# Patient Record
Sex: Male | Born: 1950
Health system: Southern US, Community
[De-identification: ages and names within clinical notes are randomized; demographics above are authoritative.]

## PROBLEM LIST (undated history)

## (undated) DIAGNOSIS — K219 Gastro-esophageal reflux disease without esophagitis: Secondary | ICD-10-CM

## (undated) DIAGNOSIS — M4800 Spinal stenosis, site unspecified: Secondary | ICD-10-CM

## (undated) DIAGNOSIS — N400 Enlarged prostate without lower urinary tract symptoms: Secondary | ICD-10-CM

## (undated) DIAGNOSIS — L719 Rosacea, unspecified: Secondary | ICD-10-CM

## (undated) DIAGNOSIS — K5790 Diverticulosis of intestine, part unspecified, without perforation or abscess without bleeding: Secondary | ICD-10-CM

## (undated) DIAGNOSIS — M1990 Unspecified osteoarthritis, unspecified site: Secondary | ICD-10-CM

## (undated) DIAGNOSIS — F419 Anxiety disorder, unspecified: Secondary | ICD-10-CM

## (undated) DIAGNOSIS — D369 Benign neoplasm, unspecified site: Secondary | ICD-10-CM

## (undated) DIAGNOSIS — Z87442 Personal history of urinary calculi: Secondary | ICD-10-CM

## (undated) DIAGNOSIS — T7840XA Allergy, unspecified, initial encounter: Secondary | ICD-10-CM

## (undated) DIAGNOSIS — E785 Hyperlipidemia, unspecified: Secondary | ICD-10-CM

## (undated) DIAGNOSIS — I10 Essential (primary) hypertension: Secondary | ICD-10-CM

## (undated) HISTORY — DX: Benign neoplasm, unspecified site: D36.9

## (undated) HISTORY — PX: LUMBAR FUSION: SHX111

## (undated) HISTORY — DX: Diverticulosis of intestine, part unspecified, without perforation or abscess without bleeding: K57.90

## (undated) HISTORY — DX: Essential (primary) hypertension: I10

## (undated) HISTORY — PX: BACK SURGERY: SHX140

## (undated) HISTORY — PX: SPINAL CORD DECOMPRESSION: SHX97

## (undated) HISTORY — DX: Rosacea, unspecified: L71.9

## (undated) HISTORY — DX: Spinal stenosis, site unspecified: M48.00

## (undated) HISTORY — DX: Allergy, unspecified, initial encounter: T78.40XA

## (undated) HISTORY — DX: Hyperlipidemia, unspecified: E78.5

## (undated) HISTORY — DX: Benign prostatic hyperplasia without lower urinary tract symptoms: N40.0

## (undated) HISTORY — PX: ASD REPAIR: SHX258

## (undated) HISTORY — DX: Anxiety disorder, unspecified: F41.9

---

## 1991-03-29 DIAGNOSIS — E1129 Type 2 diabetes mellitus with other diabetic kidney complication: Secondary | ICD-10-CM | POA: Insufficient documentation

## 2002-03-14 ENCOUNTER — Encounter: Payer: Self-pay | Admitting: Internal Medicine

## 2004-03-01 ENCOUNTER — Ambulatory Visit: Payer: Self-pay | Admitting: Internal Medicine

## 2004-03-31 ENCOUNTER — Ambulatory Visit: Payer: Self-pay | Admitting: Internal Medicine

## 2004-04-09 ENCOUNTER — Ambulatory Visit: Payer: Self-pay | Admitting: Internal Medicine

## 2004-08-20 ENCOUNTER — Ambulatory Visit: Payer: Self-pay | Admitting: Internal Medicine

## 2004-08-30 ENCOUNTER — Ambulatory Visit: Payer: Self-pay | Admitting: Internal Medicine

## 2004-09-17 ENCOUNTER — Ambulatory Visit: Payer: Self-pay | Admitting: Specialist

## 2004-11-03 ENCOUNTER — Ambulatory Visit: Payer: Self-pay | Admitting: Internal Medicine

## 2004-11-22 ENCOUNTER — Ambulatory Visit: Payer: Self-pay | Admitting: Pain Medicine

## 2004-12-06 ENCOUNTER — Ambulatory Visit: Payer: Self-pay | Admitting: Pain Medicine

## 2004-12-07 ENCOUNTER — Ambulatory Visit: Payer: Self-pay | Admitting: Pain Medicine

## 2005-03-02 ENCOUNTER — Ambulatory Visit: Payer: Self-pay | Admitting: Internal Medicine

## 2005-05-18 ENCOUNTER — Encounter: Payer: Self-pay | Admitting: Internal Medicine

## 2005-09-01 ENCOUNTER — Ambulatory Visit: Payer: Self-pay | Admitting: Internal Medicine

## 2005-11-17 ENCOUNTER — Ambulatory Visit: Payer: Self-pay | Admitting: Internal Medicine

## 2006-03-06 ENCOUNTER — Ambulatory Visit: Payer: Self-pay | Admitting: Internal Medicine

## 2006-04-28 ENCOUNTER — Encounter: Payer: Self-pay | Admitting: Internal Medicine

## 2006-09-01 ENCOUNTER — Encounter: Payer: Self-pay | Admitting: Internal Medicine

## 2006-09-01 DIAGNOSIS — I10 Essential (primary) hypertension: Secondary | ICD-10-CM | POA: Insufficient documentation

## 2006-09-01 DIAGNOSIS — E785 Hyperlipidemia, unspecified: Secondary | ICD-10-CM | POA: Insufficient documentation

## 2006-09-01 DIAGNOSIS — F411 Generalized anxiety disorder: Secondary | ICD-10-CM | POA: Insufficient documentation

## 2006-09-01 DIAGNOSIS — L719 Rosacea, unspecified: Secondary | ICD-10-CM | POA: Insufficient documentation

## 2006-09-01 DIAGNOSIS — K219 Gastro-esophageal reflux disease without esophagitis: Secondary | ICD-10-CM | POA: Insufficient documentation

## 2006-09-01 DIAGNOSIS — T7840XA Allergy, unspecified, initial encounter: Secondary | ICD-10-CM | POA: Insufficient documentation

## 2006-09-05 ENCOUNTER — Ambulatory Visit: Payer: Self-pay | Admitting: Internal Medicine

## 2006-09-06 LAB — CONVERTED CEMR LAB
ALT: 29 units/L (ref 0–40)
Albumin: 4.1 g/dL (ref 3.5–5.2)
BUN: 12 mg/dL (ref 6–23)
CO2: 30 meq/L (ref 19–32)
Calcium: 9.5 mg/dL (ref 8.4–10.5)
Chloride: 101 meq/L (ref 96–112)
Cholesterol: 167 mg/dL (ref 0–200)
Creatinine, Ser: 0.6 mg/dL (ref 0.4–1.5)
Direct LDL: 94.1 mg/dL
GFR calc Af Amer: 180 mL/min
GFR calc non Af Amer: 149 mL/min
Glucose, Bld: 90 mg/dL (ref 70–99)
HDL: 38.8 mg/dL — ABNORMAL LOW (ref >39.0)
Hgb A1c MFr Bld: 6.9 % — ABNORMAL HIGH (ref 4.6–6.0)
Phosphorus: 3.7 mg/dL (ref 2.3–4.6)
Potassium: 4.6 meq/L (ref 3.5–5.1)
Sodium: 139 meq/L (ref 135–145)
Total CHOL/HDL Ratio: 4.3
Triglycerides: 262 mg/dL (ref 0–149)
VLDL: 52 mg/dL — ABNORMAL HIGH (ref 0–40)

## 2006-10-24 ENCOUNTER — Encounter: Payer: Self-pay | Admitting: Internal Medicine

## 2007-01-29 ENCOUNTER — Ambulatory Visit: Payer: Self-pay | Admitting: Neurosurgery

## 2007-02-14 ENCOUNTER — Encounter: Payer: Self-pay | Admitting: Internal Medicine

## 2007-03-08 ENCOUNTER — Ambulatory Visit: Payer: Self-pay | Admitting: Internal Medicine

## 2007-03-08 DIAGNOSIS — N400 Enlarged prostate without lower urinary tract symptoms: Secondary | ICD-10-CM | POA: Insufficient documentation

## 2007-03-09 LAB — CONVERTED CEMR LAB
ALT: 33 units/L (ref 0–53)
Albumin: 4.7 g/dL (ref 3.5–5.2)
BUN: 13 mg/dL (ref 6–23)
CO2: 27 meq/L (ref 19–32)
Calcium: 9.7 mg/dL (ref 8.4–10.5)
Chloride: 104 meq/L (ref 96–112)
Cholesterol: 166 mg/dL (ref 0–200)
Creatinine, Ser: 0.69 mg/dL (ref 0.40–1.50)
Creatinine, Urine: 128.3 mg/dL
Glucose, Bld: 80 mg/dL (ref 70–99)
HDL: 46 mg/dL (ref >39)
Hgb A1c MFr Bld: 6.9 % — ABNORMAL HIGH (ref 4.6–6.1)
LDL Cholesterol: 77 mg/dL (ref 0–99)
Microalb Creat Ratio: 25.2 mg/g (ref 0.0–30.0)
Microalb, Ur: 3.23 mg/dL — ABNORMAL HIGH (ref 0.00–1.89)
PSA: 0.81 ng/mL (ref 0.10–4.00)
Phosphorus: 4.3 mg/dL (ref 2.3–4.6)
Potassium: 5.1 meq/L (ref 3.5–5.3)
Sodium: 141 meq/L (ref 135–145)
Total CHOL/HDL Ratio: 3.6
Triglycerides: 215 mg/dL — ABNORMAL HIGH (ref <150)
VLDL: 43 mg/dL — ABNORMAL HIGH (ref 0–40)

## 2007-03-12 ENCOUNTER — Telehealth (INDEPENDENT_AMBULATORY_CARE_PROVIDER_SITE_OTHER): Payer: Self-pay | Admitting: *Deleted

## 2007-04-09 ENCOUNTER — Ambulatory Visit: Payer: Self-pay | Admitting: Internal Medicine

## 2007-04-10 LAB — CONVERTED CEMR LAB
ALT: 27 units/L (ref 0–53)
AST: 17 units/L (ref 0–37)
Albumin: 3.9 g/dL (ref 3.5–5.2)
Alkaline Phosphatase: 27 units/L — ABNORMAL LOW (ref 39–117)
Bilirubin, Direct: 0.1 mg/dL (ref 0.0–0.3)
Total Bilirubin: 1.2 mg/dL (ref 0.3–1.2)
Total Protein: 6.1 g/dL (ref 6.0–8.3)

## 2007-04-30 ENCOUNTER — Encounter: Payer: Self-pay | Admitting: Internal Medicine

## 2007-05-01 ENCOUNTER — Telehealth (INDEPENDENT_AMBULATORY_CARE_PROVIDER_SITE_OTHER): Payer: Self-pay | Admitting: *Deleted

## 2007-05-10 ENCOUNTER — Inpatient Hospital Stay (HOSPITAL_COMMUNITY): Admission: RE | Admit: 2007-05-10 | Discharge: 2007-05-13 | Payer: Self-pay | Admitting: Neurosurgery

## 2007-06-08 ENCOUNTER — Encounter: Payer: Self-pay | Admitting: Internal Medicine

## 2007-07-09 ENCOUNTER — Encounter: Payer: Self-pay | Admitting: Internal Medicine

## 2007-07-11 ENCOUNTER — Encounter: Payer: Self-pay | Admitting: Internal Medicine

## 2007-09-10 ENCOUNTER — Encounter: Payer: Self-pay | Admitting: Internal Medicine

## 2007-09-11 ENCOUNTER — Telehealth (INDEPENDENT_AMBULATORY_CARE_PROVIDER_SITE_OTHER): Payer: Self-pay | Admitting: *Deleted

## 2007-09-11 ENCOUNTER — Ambulatory Visit: Payer: Self-pay | Admitting: Internal Medicine

## 2007-09-12 LAB — CONVERTED CEMR LAB
ALT: 33 units/L (ref 0–53)
AST: 25 units/L (ref 0–37)
Albumin: 4.5 g/dL (ref 3.5–5.2)
Alkaline Phosphatase: 36 units/L — ABNORMAL LOW (ref 39–117)
BUN: 11 mg/dL (ref 6–23)
Basophils Absolute: 0 10*3/uL (ref 0.0–0.1)
Basophils Relative: 0.1 % (ref 0.0–1.0)
Bilirubin, Direct: 0.1 mg/dL (ref 0.0–0.3)
CO2: 34 meq/L — ABNORMAL HIGH (ref 19–32)
Calcium: 9.4 mg/dL (ref 8.4–10.5)
Chloride: 102 meq/L (ref 96–112)
Cholesterol: 134 mg/dL (ref 0–200)
Creatinine, Ser: 0.7 mg/dL (ref 0.4–1.5)
Eosinophils Absolute: 0.2 10*3/uL (ref 0.0–0.7)
Eosinophils Relative: 3.1 % (ref 0.0–5.0)
GFR calc Af Amer: 150 mL/min
GFR calc non Af Amer: 124 mL/min
Glucose, Bld: 98 mg/dL (ref 70–99)
HCT: 44.3 % (ref 39.0–52.0)
HDL: 47 mg/dL (ref >39.0)
Hemoglobin: 15.1 g/dL (ref 13.0–17.0)
Hgb A1c MFr Bld: 6.3 % — ABNORMAL HIGH (ref 4.6–6.0)
LDL Cholesterol: 63 mg/dL (ref 0–99)
Lymphocytes Relative: 27.1 % (ref 12.0–46.0)
MCHC: 34 g/dL (ref 30.0–36.0)
MCV: 90.9 fL (ref 78.0–100.0)
Monocytes Absolute: 0.9 10*3/uL (ref 0.1–1.0)
Monocytes Relative: 11.8 % (ref 3.0–12.0)
Neutro Abs: 4.7 10*3/uL (ref 1.4–7.7)
Neutrophils Relative %: 57.9 % (ref 43.0–77.0)
Phosphorus: 4.3 mg/dL (ref 2.3–4.6)
Platelets: 204 10*3/uL (ref 150–400)
Potassium: 4.9 meq/L (ref 3.5–5.1)
RBC: 4.87 M/uL (ref 4.22–5.81)
RDW: 13 % (ref 11.5–14.6)
Sodium: 142 meq/L (ref 135–145)
Total Bilirubin: 1.2 mg/dL (ref 0.3–1.2)
Total CHOL/HDL Ratio: 2.9
Total Protein: 6.8 g/dL (ref 6.0–8.3)
Triglycerides: 122 mg/dL (ref 0–149)
VLDL: 24 mg/dL (ref 0–40)
WBC: 7.9 10*3/uL (ref 4.5–10.5)

## 2007-10-16 ENCOUNTER — Encounter: Payer: Self-pay | Admitting: Internal Medicine

## 2007-10-19 ENCOUNTER — Ambulatory Visit: Payer: Self-pay | Admitting: Internal Medicine

## 2007-10-19 DIAGNOSIS — Z86010 Personal history of colonic polyps: Secondary | ICD-10-CM | POA: Insufficient documentation

## 2007-10-25 ENCOUNTER — Encounter: Payer: Self-pay | Admitting: Internal Medicine

## 2007-10-29 ENCOUNTER — Ambulatory Visit: Payer: Self-pay | Admitting: Internal Medicine

## 2007-10-29 ENCOUNTER — Encounter: Payer: Self-pay | Admitting: Internal Medicine

## 2007-10-29 LAB — HM COLONOSCOPY

## 2007-10-31 ENCOUNTER — Encounter: Payer: Self-pay | Admitting: Internal Medicine

## 2008-01-29 ENCOUNTER — Telehealth: Payer: Self-pay | Admitting: Internal Medicine

## 2008-02-19 ENCOUNTER — Telehealth: Payer: Self-pay | Admitting: Internal Medicine

## 2008-03-14 ENCOUNTER — Ambulatory Visit: Payer: Self-pay | Admitting: Internal Medicine

## 2008-03-14 DIAGNOSIS — J309 Allergic rhinitis, unspecified: Secondary | ICD-10-CM | POA: Insufficient documentation

## 2008-03-17 LAB — CONVERTED CEMR LAB
ALT: 42 units/L (ref 0–53)
AST: 23 units/L (ref 0–37)
Albumin: 4.7 g/dL (ref 3.5–5.2)
Alkaline Phosphatase: 16 units/L — ABNORMAL LOW (ref 39–117)
BUN: 14 mg/dL (ref 6–23)
Basophils Absolute: 0 10*3/uL (ref 0.0–0.1)
Basophils Relative: 0 % (ref 0–1)
CO2: 22 meq/L (ref 19–32)
Calcium: 9.4 mg/dL (ref 8.4–10.5)
Chloride: 104 meq/L (ref 96–112)
Cholesterol: 125 mg/dL (ref 0–200)
Creatinine, Ser: 0.61 mg/dL (ref 0.40–1.50)
Creatinine, Urine: 172.9 mg/dL
Eosinophils Absolute: 0.2 10*3/uL (ref 0.0–0.7)
Eosinophils Relative: 3 % (ref 0–5)
Glucose, Bld: 90 mg/dL (ref 70–99)
HCT: 42.5 % (ref 39.0–52.0)
HDL: 37 mg/dL — ABNORMAL LOW (ref >39)
Hemoglobin: 14.3 g/dL (ref 13.0–17.0)
Hgb A1c MFr Bld: 6.8 % — ABNORMAL HIGH (ref 4.6–6.1)
LDL Cholesterol: 67 mg/dL (ref 0–99)
Lymphocytes Relative: 34 % (ref 12–46)
Lymphs Abs: 2.5 10*3/uL (ref 0.7–4.0)
MCHC: 33.6 g/dL (ref 30.0–36.0)
MCV: 87.4 fL (ref 78.0–100.0)
Microalb Creat Ratio: 15 mg/g (ref 0.0–30.0)
Microalb, Ur: 2.6 mg/dL — ABNORMAL HIGH (ref 0.00–1.89)
Monocytes Absolute: 0.9 10*3/uL (ref 0.1–1.0)
Monocytes Relative: 13 % — ABNORMAL HIGH (ref 3–12)
Neutro Abs: 3.8 10*3/uL (ref 1.7–7.7)
Neutrophils Relative %: 51 % (ref 43–77)
PSA: 0.67 ng/mL (ref 0.10–4.00)
Platelets: 218 10*3/uL (ref 150–400)
Potassium: 4.9 meq/L (ref 3.5–5.3)
RBC: 4.86 M/uL (ref 4.22–5.81)
RDW: 13.8 % (ref 11.5–15.5)
Sodium: 144 meq/L (ref 135–145)
TSH: 4.168 microintl units/mL (ref 0.350–4.50)
Total Bilirubin: 1 mg/dL (ref 0.3–1.2)
Total CHOL/HDL Ratio: 3.4
Total Protein: 6.8 g/dL (ref 6.0–8.3)
Triglycerides: 103 mg/dL (ref <150)
VLDL: 21 mg/dL (ref 0–40)
WBC: 7.5 10*3/uL (ref 4.0–10.5)

## 2008-05-27 ENCOUNTER — Telehealth: Payer: Self-pay | Admitting: Internal Medicine

## 2008-06-13 ENCOUNTER — Encounter: Payer: Self-pay | Admitting: Internal Medicine

## 2008-06-19 ENCOUNTER — Telehealth: Payer: Self-pay | Admitting: Internal Medicine

## 2008-07-28 ENCOUNTER — Encounter: Payer: Self-pay | Admitting: Internal Medicine

## 2008-09-17 ENCOUNTER — Ambulatory Visit: Payer: Self-pay | Admitting: Internal Medicine

## 2008-09-17 DIAGNOSIS — R21 Rash and other nonspecific skin eruption: Secondary | ICD-10-CM | POA: Insufficient documentation

## 2008-09-19 LAB — CONVERTED CEMR LAB
Albumin: 4.1 g/dL (ref 3.5–5.2)
BUN: 10 mg/dL (ref 6–23)
CO2: 31 meq/L (ref 19–32)
Calcium: 9.4 mg/dL (ref 8.4–10.5)
Chloride: 103 meq/L (ref 96–112)
Creatinine, Ser: 0.7 mg/dL (ref 0.4–1.5)
Glucose, Bld: 104 mg/dL — ABNORMAL HIGH (ref 70–99)
Hgb A1c MFr Bld: 6.7 % — ABNORMAL HIGH (ref 4.6–6.5)
Phosphorus: 4.9 mg/dL — ABNORMAL HIGH (ref 2.3–4.6)
Potassium: 4.7 meq/L (ref 3.5–5.1)
Sodium: 142 meq/L (ref 135–145)
Vitamin B-12: 283 pg/mL (ref 211–911)

## 2008-12-17 IMAGING — CR DG LUMBAR SPINE 1V
1 series · 1 of 1 positions shown · non-contrast
Comparison: None

CLINICAL DATA: L4 laminectomy and L4-L5 fixation.

LUMBAR SPINE - 1  VIEW

[view not recorded]
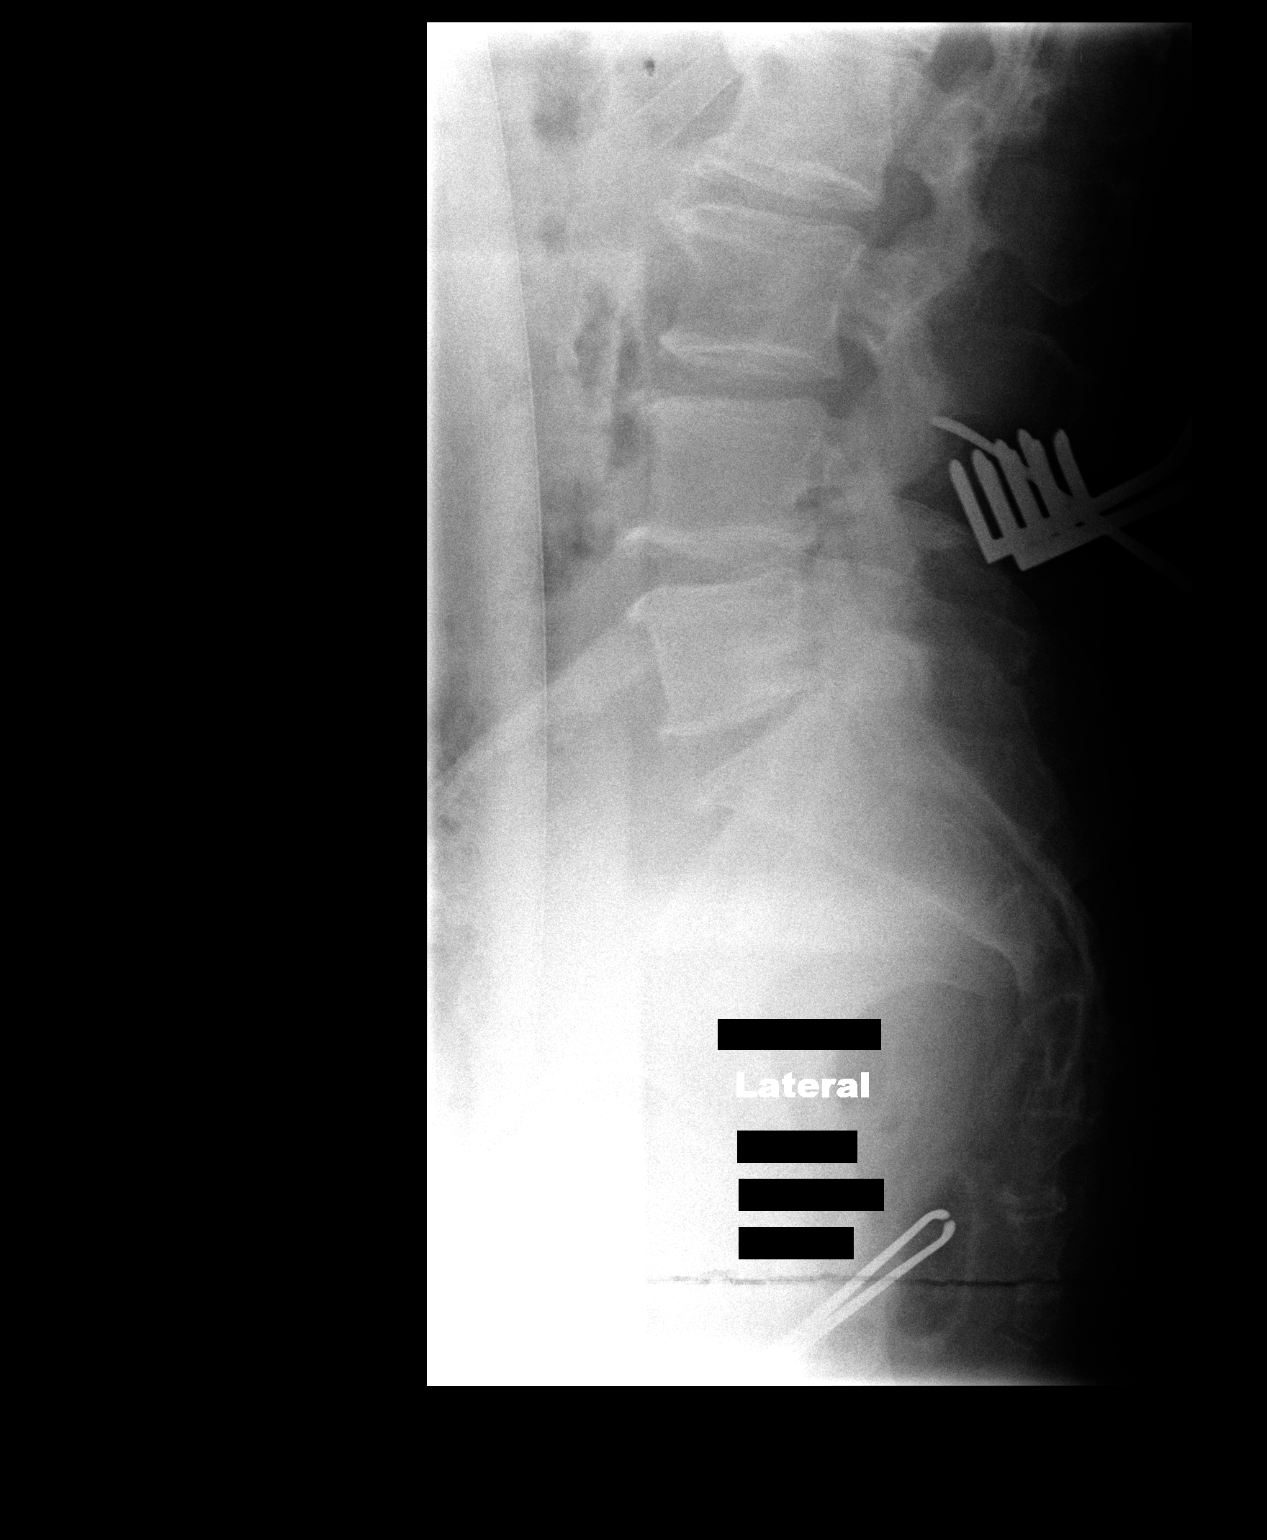

[1 of 1 positions shown; findings below may reference images not displayed]

FINDINGS: Single lateral view labeled 2384 hours demonstrates a surgical device
projecting posterior to the L3-L4 level. L 4-5 and L5-S1 degenerative disc
disease.

IMPRESSION

1. L3-L4 localization.

## 2008-12-17 IMAGING — RF DG LUMBAR SPINE 2-3V
1 series · 2 of 2 positions shown · non-contrast
Comparison: Intraoperative localization view from earlier the same date.  No prior studies are available for correlation.

CLINICAL DATA: L4-5 laminectomy and PLIF.
PORTABLE LUMBAR SPINE - 2 VIEW:

[Series 1: run · 2 of 2 slices shown]
[im 1/2]
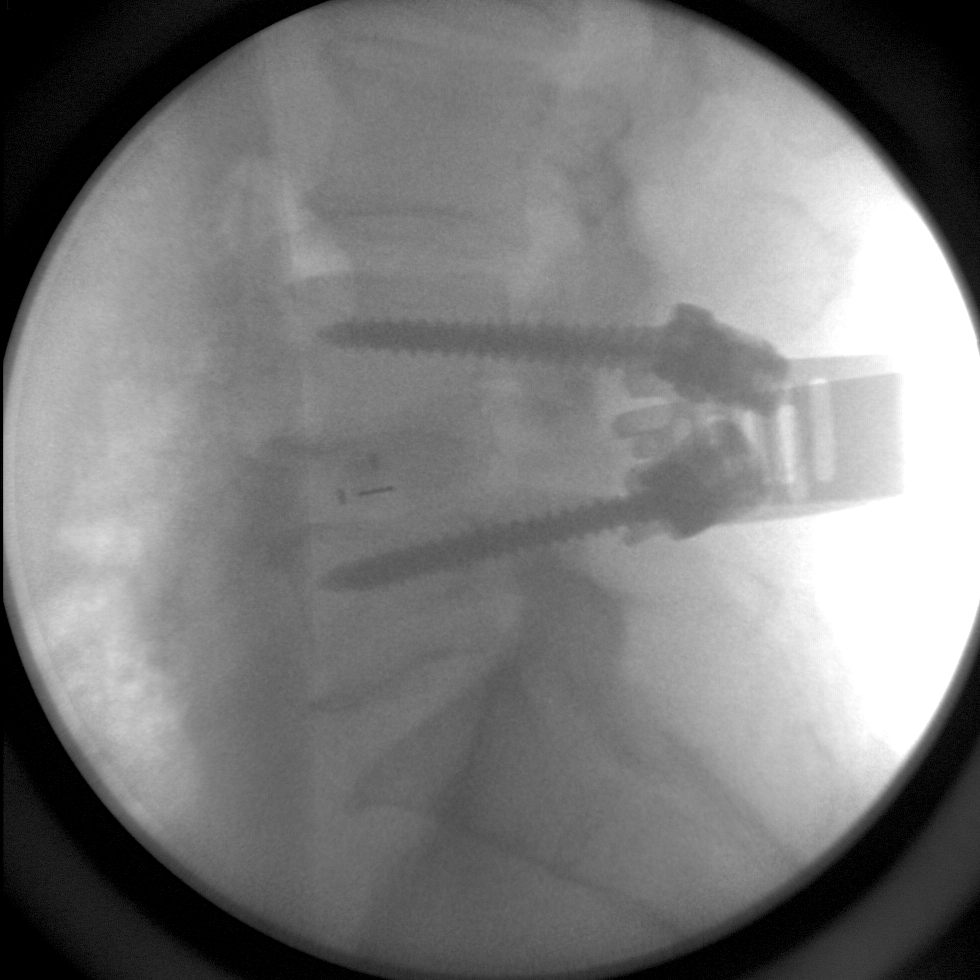
[im 2/2]
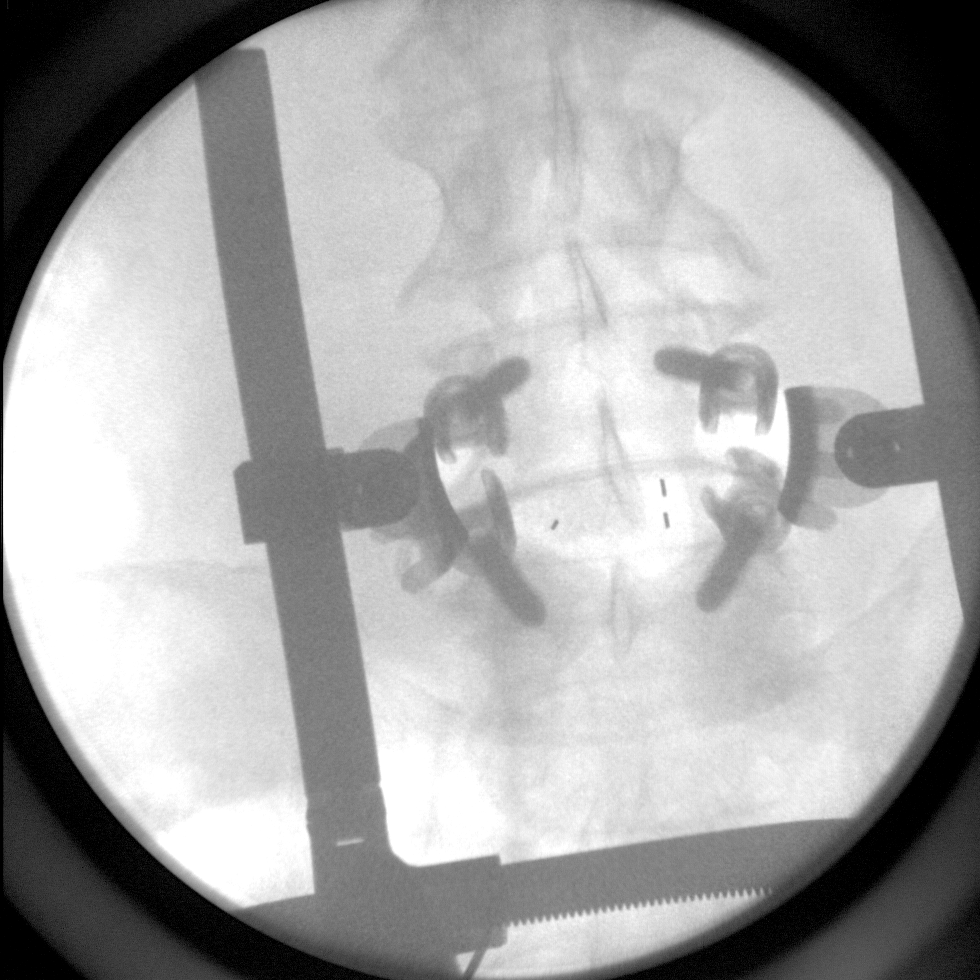

[2 of 2 positions shown; findings below may reference images not displayed]

FINDINGS: and lateral spot fluoroscopic images submitted postoperatively from Operating Room demonstrate placement of bilateral pedicle screws at L4 and L5 assuming the presence of 5 lumbar type vertebral bodies.  Interbody spacer appears well positioned.  The interconnecting rods have not yet been placed.
IMPRESSION: Intraoperative views during L4-5 laminectomy and PLIF.

## 2009-03-11 ENCOUNTER — Telehealth: Payer: Self-pay | Admitting: Internal Medicine

## 2009-03-18 ENCOUNTER — Ambulatory Visit: Payer: Self-pay | Admitting: Internal Medicine

## 2009-03-19 LAB — CONVERTED CEMR LAB
ALT: 28 units/L (ref 0–53)
AST: 23 units/L (ref 0–37)
Albumin: 4.7 g/dL (ref 3.5–5.2)
Alkaline Phosphatase: 34 units/L — ABNORMAL LOW (ref 39–117)
BUN: 11 mg/dL (ref 6–23)
Basophils Absolute: 0 10*3/uL (ref 0.0–0.1)
Basophils Relative: 0 % (ref 0–1)
CO2: 29 meq/L (ref 19–32)
Calcium: 10.4 mg/dL (ref 8.4–10.5)
Chloride: 100 meq/L (ref 96–112)
Cholesterol: 124 mg/dL (ref 0–200)
Creatinine, Ser: 0.7 mg/dL (ref 0.40–1.50)
Creatinine, Urine: 217.3 mg/dL
Eosinophils Absolute: 0.2 10*3/uL (ref 0.0–0.7)
Eosinophils Relative: 3 % (ref 0–5)
Free T4: 1.1 ng/dL (ref 0.80–1.80)
Glucose, Bld: 79 mg/dL (ref 70–99)
HCT: 44.8 % (ref 39.0–52.0)
HDL: 44 mg/dL (ref >39)
Hemoglobin: 15.2 g/dL (ref 13.0–17.0)
Hgb A1c MFr Bld: 6.2 % — ABNORMAL HIGH (ref 4.6–6.1)
LDL Cholesterol: 60 mg/dL (ref 0–99)
Lymphocytes Relative: 37 % (ref 12–46)
Lymphs Abs: 3.1 10*3/uL (ref 0.7–4.0)
MCHC: 33.9 g/dL (ref 30.0–36.0)
MCV: 86.7 fL (ref 78.0–100.0)
Microalb Creat Ratio: 16.1 mg/g (ref 0.0–30.0)
Microalb, Ur: 3.5 mg/dL — ABNORMAL HIGH (ref 0.00–1.89)
Monocytes Absolute: 0.9 10*3/uL (ref 0.1–1.0)
Monocytes Relative: 11 % (ref 3–12)
Neutro Abs: 4.1 10*3/uL (ref 1.7–7.7)
Neutrophils Relative %: 49 % (ref 43–77)
PSA: 1.18 ng/mL (ref 0.10–4.00)
Platelets: 233 10*3/uL (ref 150–400)
Potassium: 4.4 meq/L (ref 3.5–5.3)
RBC: 5.17 M/uL (ref 4.22–5.81)
RDW: 13.6 % (ref 11.5–15.5)
Sodium: 140 meq/L (ref 135–145)
TSH: 4.843 microintl units/mL — ABNORMAL HIGH (ref 0.350–4.500)
Total Bilirubin: 1 mg/dL (ref 0.3–1.2)
Total CHOL/HDL Ratio: 2.8
Total Protein: 6.9 g/dL (ref 6.0–8.3)
Triglycerides: 101 mg/dL (ref <150)
VLDL: 20 mg/dL (ref 0–40)
WBC: 8.3 10*3/uL (ref 4.0–10.5)

## 2009-03-24 ENCOUNTER — Encounter: Payer: Self-pay | Admitting: Internal Medicine

## 2009-06-23 ENCOUNTER — Encounter: Payer: Self-pay | Admitting: Internal Medicine

## 2009-09-02 LAB — HM DIABETES EYE EXAM: HM Diabetic Eye Exam: NORMAL

## 2009-09-16 ENCOUNTER — Ambulatory Visit: Payer: Self-pay | Admitting: Internal Medicine

## 2009-09-17 LAB — CONVERTED CEMR LAB: Hgb A1c MFr Bld: 5.9 % (ref 4.6–6.5)

## 2010-03-08 ENCOUNTER — Ambulatory Visit: Payer: Self-pay | Admitting: Internal Medicine

## 2010-03-08 LAB — HM DIABETES FOOT EXAM

## 2010-03-10 LAB — CONVERTED CEMR LAB
ALT: 24 units/L (ref 0–53)
AST: 23 units/L (ref 0–37)
Albumin: 4.7 g/dL (ref 3.5–5.2)
Alkaline Phosphatase: 37 units/L — ABNORMAL LOW (ref 39–117)
BUN: 13 mg/dL (ref 6–23)
Basophils Absolute: 0 10*3/uL (ref 0.0–0.1)
Basophils Relative: 0.3 % (ref 0.0–3.0)
Bilirubin, Direct: 0.2 mg/dL (ref 0.0–0.3)
CO2: 32 meq/L (ref 19–32)
Calcium: 9.9 mg/dL (ref 8.4–10.5)
Chloride: 104 meq/L (ref 96–112)
Cholesterol: 135 mg/dL (ref 0–200)
Creatinine, Ser: 0.7 mg/dL (ref 0.4–1.5)
Creatinine,U: 67.1 mg/dL
Eosinophils Absolute: 0.2 10*3/uL (ref 0.0–0.7)
Eosinophils Relative: 2.7 % (ref 0.0–5.0)
GFR calc non Af Amer: 116.98 mL/min (ref >60.00)
Glucose, Bld: 101 mg/dL — ABNORMAL HIGH (ref 70–99)
HCT: 43.2 % (ref 39.0–52.0)
HDL: 47 mg/dL (ref >39.00)
Hemoglobin: 14.8 g/dL (ref 13.0–17.0)
Hgb A1c MFr Bld: 6.3 % (ref 4.6–6.5)
LDL Cholesterol: 71 mg/dL (ref 0–99)
Lymphocytes Relative: 33.7 % (ref 12.0–46.0)
Lymphs Abs: 2.1 10*3/uL (ref 0.7–4.0)
MCHC: 34.3 g/dL (ref 30.0–36.0)
MCV: 92.7 fL (ref 78.0–100.0)
Microalb Creat Ratio: 2.4 mg/g (ref 0.0–30.0)
Microalb, Ur: 1.6 mg/dL (ref 0.0–1.9)
Monocytes Absolute: 0.6 10*3/uL (ref 0.1–1.0)
Monocytes Relative: 10.3 % (ref 3.0–12.0)
Neutro Abs: 3.3 10*3/uL (ref 1.4–7.7)
Neutrophils Relative %: 53 % (ref 43.0–77.0)
PSA: 1.24 ng/mL (ref 0.10–4.00)
Phosphorus: 3.7 mg/dL (ref 2.3–4.6)
Platelets: 198 10*3/uL (ref 150.0–400.0)
Potassium: 5.1 meq/L (ref 3.5–5.1)
RBC: 4.66 M/uL (ref 4.22–5.81)
RDW: 13.6 % (ref 11.5–14.6)
Sodium: 143 meq/L (ref 135–145)
TSH: 2.51 microintl units/mL (ref 0.35–5.50)
Total Bilirubin: 0.9 mg/dL (ref 0.3–1.2)
Total CHOL/HDL Ratio: 3
Total Protein: 7.1 g/dL (ref 6.0–8.3)
Triglycerides: 85 mg/dL (ref 0.0–149.0)
VLDL: 17 mg/dL (ref 0.0–40.0)
WBC: 6.3 10*3/uL (ref 4.5–10.5)

## 2010-03-11 ENCOUNTER — Encounter: Payer: Self-pay | Admitting: Internal Medicine

## 2010-04-28 NOTE — Assessment & Plan Note (Signed)
Summary: FOLLOW UP / LFW   Vital Signs:  Patient profile:   60 year old male Weight:      191 pounds BMI:     32.40 Temp:     98.4 degrees F oral Pulse rate:   60 / minute Pulse rhythm:   regular BP sitting:   108 / 68  (left arm) Cuff size:   large  Vitals Entered By: Mervin Hack CMA Duncan Dull) (September 16, 2009 9:24 AM) CC: 6 month follow-up   History of Present Illness: Has been doing okay Lost considerable weight ---has slipped some with travel and vacation Likes fresh veggies --esp on the grill  sugars are down  still dependent on what he eats insulin down to 25 units No hypoglycemic reactions recent eye exam  No chest pain No SOB going to gym several times per week---good stamina  No stomach trouble in general again has to be careful with eating tends to be up late and hoabits do affect him  No urinary problems  slow flow at times but nothing worrisome  Allergies: No Known Drug Allergies  Past History:  Past medical, surgical, family and social histories (including risk factors) reviewed for relevance to current acute and chronic problems.  Past Medical History: Reviewed history from 03/14/2008 and no changes required. Anxiety Diabetes mellitus, type II--with nephropathy GERD Hypertension Hyperlipidemia Rosacea Spinal stenosis/disc disease Adenomatous polyp Benign prostatic hypertrophy Allergic rhinitis  Past Surgical History: Reviewed history from 06/29/2007 and no changes required.  ~2001    Stress Test (-) 1920's   Septal repair               Kidney stone x 1 2/09 L4-5 decompression and fusion Lovell Sheehan)  Family History: Reviewed history from 09/01/2006 and no changes required. Father: Alive at 40-- DM Mother: Alive 47 Siblings: 2 brothers, 1 sister HTN, depression and anxiety in family ETOHism-- Oneal Grout CVA:  strong in family  Social History: Reviewed history from 03/18/2009 and no changes required. 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 Still tends to fall asleep on couch but sleeps okay bowels okay Needs place on right great toe checked ---mild discomfort  Physical Exam  General:  alert and normal appearance.   Neck:  supple, no masses, no thyromegaly, no carotid bruits, and no cervical lymphadenopathy.   Lungs:  normal respiratory effort and normal breath sounds.   Heart:  normal rate, regular rhythm, no murmur, and no gallop.   Abdomen:  soft and non-tender.   Pulses:  1+ in feet Extremities:  no edema Skin:  no rashes, no suspicious lesions, and no ulcerations.   Psych:  normally interactive, good eye contact, not anxious appearing, and not depressed appearing.    Diabetes Management Exam:    Foot Exam (with socks and/or shoes not present):       Sensory-Pinprick/Light touch:          Left medial foot (L-4): normal          Left dorsal foot (L-5): normal          Left lateral foot (S-1): normal          Right medial foot (L-4):  normal          Right dorsal foot (L-5): normal          Right lateral foot (S-1): normal       Inspection:          Left foot: normal          Right foot: abnormal             Comments: very slight distal ingrowing great toe       Nails:          Left foot: normal          Right foot: normal    Eye Exam:       Eye Exam done elsewhere          Date: 09/02/2009          Results: normal          Done by: [facility]   Impression & Recommendations:  Problem # 1:  DIABETES MELLITUS, TYPE II, WITH RENAL COMPLICATIONS (ICD-250.40) Assessment Improved  if still good, will wean insulin further  His updated medication list for this problem includes:     Metformin Hcl 1000 Mg Tabs (Metformin hcl) .Marland Kitchen... 1 tablet by mouth twice a day    Altace 5 Mg Caps (Ramipril) .Marland Kitchen... 1 by mouth daily    Lantus 100 Unit/ml Soln (Insulin glargine) .Marland Kitchen... 25  units daily as directed    Glipizide Xl 10 Mg Tb24 (Glipizide) .Marland Kitchen... 1 by mouth daily    Aspir-low 81 Mg Tbec (Aspirin) .Marland Kitchen... 1 by mouth daily  Labs Reviewed: Creat: 0.70 (03/18/2009)     Last Eye Exam: normal (09/02/2009) Reviewed HgBA1c results: 6.2 (03/18/2009)  6.7 (09/17/2008)  Orders: TLB-A1C / Hgb A1C (Glycohemoglobin) (83036-A1C) Venipuncture (45409)  Problem # 2:  HYPERTENSION (ICD-401.9) Assessment: Unchanged good control no wean unless has orthostatic dizziness  His updated medication list for this problem includes:    Altace 5 Mg Caps (Ramipril) .Marland Kitchen... 1 by mouth daily    Doxazosin Mesylate 4 Mg Tabs (Doxazosin mesylate) .Marland Kitchen... 1/2 nightly for 1 week then increase to 1 nightly for enlarged prostate  BP today: 108/68 Prior BP: 128/80 (03/18/2009)  Labs Reviewed: K+: 4.4 (03/18/2009) Creat: : 0.70 (03/18/2009)   Chol: 124 (03/18/2009)   HDL: 44 (03/18/2009)   LDL: 60 (03/18/2009)   TG: 101 (03/18/2009)  Problem # 3:  DYSLIPIDEMIA (ICD-272.4) Assessment: Unchanged good control labs next time  His updated medication list for this problem includes:    Simvastatin 40 Mg Tabs (Simvastatin) .Marland Kitchen... 1 by mouth daily  Labs Reviewed: SGOT: 23 (03/18/2009)   SGPT: 28 (03/18/2009)   HDL:44 (03/18/2009), 37 (03/14/2008)  LDL:60 (03/18/2009), 67 (03/14/2008)  Chol:124 (03/18/2009), 125 (03/14/2008)  Trig:101 (03/18/2009), 103 (03/14/2008)  Problem # 4:  BENIGN PROSTATIC HYPERTROPHY (ICD-600.00) Assessment: Comment Only doing fine on the doxazosin  Complete Medication List: 1)  Diazepam 5 Mg Tabs (Diazepam) .... As needed for back spasms 2)  Metformin Hcl 1000 Mg Tabs (Metformin hcl) .Marland Kitchen.. 1 tablet by mouth twice a day 3)  Nexium 40 Mg Cpdr (Esomeprazole magnesium) .... Take one by mouth  daily 4)  Altace 5 Mg Caps (Ramipril) .Marland Kitchen.. 1 by mouth daily 5)  Vitamin B-6 100 Mg Tabs (Pyridoxine hcl) 6)  Simvastatin 40 Mg Tabs (Simvastatin) .Marland Kitchen.. 1 by mouth daily 7)  Lantus 100 Unit/ml Soln (Insulin glargine) .... 25  units daily as directed 8)  Glipizide Xl 10 Mg Tb24 (Glipizide) .Marland Kitchen.. 1 by mouth  daily 9)  Doxazosin Mesylate 4 Mg Tabs (Doxazosin mesylate) .... 1/2 nightly for 1 week then increase to 1 nightly for enlarged prostate 10)  Paroxetine Hcl 20 Mg Tabs (Paroxetine hcl) .Marland Kitchen.. 1 daily 11)  Fish Oil 1000 Mg Caps (Omega-3 fatty acids) .Marland Kitchen.. 1 by mouth two times a day 12)  Patanol 0.1 % Soln (Olopatadine hcl) .... Use one to two drops in eyes twice a day if needed for allergies 13)  Glucosamine-chondroitin Caps (Glucosamine-chondroit-vit c-mn) 14)  Triamcinolone Acetonide 0.1 % Crea (Triamcinolone acetonide) .... Apply three times a day to rash as needed till cleared 15)  Fluticasone Propionate 50 Mcg/act Susp (Fluticasone propionate) .... 2 sprays in each nostril daily for allergy symptoms 16)  Aspir-low 81 Mg Tbec (Aspirin) .Marland Kitchen.. 1 by mouth daily 17)  Vitamin C 500 Mg Tabs (Ascorbic acid) .Marland Kitchen.. 1 by mouth daily 18)  Multivitamins Tabs (Multiple vitamin) .Marland Kitchen.. 1 by mouth daily 19)  Selenium Caps (Selenium caps) 20)  Bd U/f Short Pen Needle 31g X 8 Mm Misc (Insulin pen needle) .... Use as directed 21)  Zinc Tabs (Zinc tabs) 22)  Onetouch Ultra Test Strp (Glucose blood) .... Use as directed  Patient Instructions: 1)  Please schedule a follow-up appointment in 6 months for physical  Current Allergies (reviewed today): No known allergies

## 2010-04-28 NOTE — Progress Notes (Signed)
Summary: need to change med per insurance  Phone Note Call from Patient   Caller: Patient Call For: Cindee Salt MD Summary of Call: Pt needs to change from protonix to nexium per his insurance company.  Please send to [facility] and ask them to leave on file until pt needs this. ([facility]) Initial call taken by: Lowella Petties,  February 19, 2008 11:24 AM  Follow-up for Phone Call        okay to make change please phone in so they know not to fill it now  and update the med list Follow-up by: Cindee Salt MD,  February 19, 2008 12:56 PM  Additional Follow-up for Phone Call Additional follow up Details #1::        Called to [facility], med list updated Additional Follow-up by: Lowella Petties,  February 19, 2008 1:29 PM    New/Updated Medications: NEXIUM 40 MG CPDR (ESOMEPRAZOLE MAGNESIUM) take one by mouth daily    Prior Medications: METFORMIN HCL 1000 MG TABS (METFORMIN HCL) 1 tablet by mouth twice a day RHINOCORT AQUA 32 MCG/ACT SUSP (BUDESONIDE (NASAL)) use as directed PATANOL 0.1 % SOLN (OLOPATADINE HCL) Use one to two drops in eyes twice a day if needed for allergies ALTACE 5 MG CAPS (RAMIPRIL) 1 by mouth daily ASPIR-LOW 81 MG TBEC (ASPIRIN) 1 by mouth daily VITAMIN C 500 MG TABS (ASCORBIC ACID) 1 by mouth daily MULTIVITAMINS  TABS (MULTIPLE VITAMIN) 1 by mouth daily SELENIUM  CAPS (SELENIUM CAPS)  ZINC  TABS (ZINC TABS)  VITAMIN B-6 100 MG TABS (PYRIDOXINE HCL)  SAW PALMETTO 500 MG CAPS (SAW PALMETTO (SERENOA REPENS))  GLUCOSAMINE-CHONDROITIN  CAPS (GLUCOSAMINE-CHONDROIT-VIT C-MN)  SIMVASTATIN 40 MG TABS (SIMVASTATIN) 1 by mouth daily FISH OIL 1000 MG CAPS (OMEGA-3 FATTY ACIDS) 1 by mouth two times a day LANTUS 100 UNIT/ML SOLN (INSULIN GLARGINE) 32 units daily GLIPIZIDE XL 10 MG TB24 (GLIPIZIDE) 1 by mouth daily SOMA 350 MG TABS (CARISOPRODOL) as needed HYDROCODONE-ACETAMINOPHEN 5-500 MG TABS (HYDROCODONE-ACETAMINOPHEN) as needed DOXAZOSIN  MESYLATE 4 MG  TABS (DOXAZOSIN MESYLATE) 1/2 nightly for 1 week then increase to 1 nightly for enlarged prostate PAXIL 20 MG  TABS (PAROXETINE HCL) Take one by mouth once a day BD U/F SHORT PEN NEEDLE 31G X 8 MM MISC (INSULIN PEN NEEDLE)  Current Allergies: No known allergies

## 2010-04-28 NOTE — Progress Notes (Signed)
Summary: PATANOL  Phone Note Refill Request Message from:  [facility] on May 27, 2008 12:28 PM  Refills Requested: Medication #1:  PATANOL 0.1 % SOLN Use one to two drops in eyes twice a day if needed for allergies   Last Refilled: 03/26/2008 is this ok to fill?   Method Requested: Fax to Local Pharmacy Initial call taken by: Mervin Hack CMA,  May 27, 2008 12:28 PM  Follow-up for Phone Call        okay to refill x 1 year allergy eye drops Follow-up by: Cindee Salt MD,  May 27, 2008 1:43 PM  Additional Follow-up for Phone Call Additional follow up Details #1::        Rx faxed to pharmacy Additional Follow-up by: Mervin Hack CMA,  May 27, 2008 3:06 PM      Prescriptions: PATANOL 0.1 % SOLN (OLOPATADINE HCL) Use one to two drops in eyes twice a day if needed for allergies  #1 x 12   Entered by:   Mervin Hack CMA   Authorized by:   Cindee Salt MD   Signed by:   Mervin Hack CMA on 05/27/2008   Method used:   Electronically to        [facility]* (retail)       [address]       [place], [place]  [address]       Ph: [phone]       Fax: [phone]   RxID:   [id number]

## 2010-04-28 NOTE — Letter (Signed)
Summary: [facility] / DIABETIC EYE EXAM / DR. VAN ROMINE  [facility] / DIABETIC EYE EXAM / DR. VAN ROMINE   Imported By: Carin Primrose 08/01/2008 15:42:10  _____________________________________________________________________  External Attachment:    Type:   Image     Comment:   External Document  Appended Document: [facility] / DIABETIC EYE EXAM / DR. VAN ROMINE     Clinical Lists Changes  Observations: Added new observation of DIAB EYE EX: No diabetic retinopathy.    (07/28/2008 9:31)       Diabetic Eye Exam  Procedure date:  07/28/2008  Findings:      No diabetic retinopathy.

## 2010-04-28 NOTE — Medication Information (Signed)
Summary: [facility] Communication Form  [facility] Communication Form   Imported By: Beau Fanny 07/16/2007 15:33:58  _____________________________________________________________________  External Attachment:    Type:   Image     Comment:   External Document

## 2010-04-28 NOTE — Assessment & Plan Note (Signed)
Summary: HISTORY OF POLYPS, DUE FOR COLONOSCOPY    History of Present Illness Visit Type: follow up Primary GI MD: Stan Head MD [facility] Primary Cerina Leary: Tillman Abide MD Chief Complaint: recall colon no GI problems History of Present Illness:   Adenomatous polyp on IC valve and fair prep 1/06 colonoscopy. Received recall letter but delayed due to lumbar fusion. No GI complaints and back is improving              Updated Prior Medication List: METFORMIN HCL 1000 MG TABS (METFORMIN HCL) 1 tablet by mouth twice a day PROTONIX 40 MG TBEC (PANTOPRAZOLE SODIUM) 1 by mouth daily RHINOCORT AQUA 32 MCG/ACT SUSP (BUDESONIDE (NASAL)) use as directed PATANOL 0.1 % SOLN (OLOPATADINE HCL) Use one to two drops in eyes twice a day if needed for allergies ALTACE 5 MG CAPS (RAMIPRIL) 1 by mouth daily ASPIR-LOW 81 MG TBEC (ASPIRIN) 1 by mouth daily VITAMIN C 500 MG TABS (ASCORBIC ACID) 1 by mouth daily MULTIVITAMINS  TABS (MULTIPLE VITAMIN) 1 by mouth daily SELENIUM  CAPS (SELENIUM CAPS)  ZINC  TABS (ZINC TABS)  VITAMIN B-6 100 MG TABS (PYRIDOXINE HCL)  SAW PALMETTO 500 MG CAPS (SAW PALMETTO (SERENOA REPENS))  GLUCOSAMINE-CHONDROITIN  CAPS (GLUCOSAMINE-CHONDROIT-VIT C-MN)  SIMVASTATIN 40 MG TABS (SIMVASTATIN) 1 by mouth daily FISH OIL 1000 MG CAPS (OMEGA-3 FATTY ACIDS) 1 by mouth two times a day LANTUS 100 UNIT/ML SOLN (INSULIN GLARGINE) 32 units daily GLIPIZIDE XL 10 MG TB24 (GLIPIZIDE) 1 by mouth daily SOMA 350 MG TABS (CARISOPRODOL) as needed HYDROCODONE-ACETAMINOPHEN 5-500 MG TABS (HYDROCODONE-ACETAMINOPHEN) as needed DOXAZOSIN MESYLATE 4 MG  TABS (DOXAZOSIN MESYLATE) 1/2 nightly for 1 week then increase to 1 nightly for enlarged prostate PAXIL 20 MG  TABS (PAROXETINE HCL) Take one by mouth once a day  Current Allergies (reviewed today): No known allergies   Past Medical History:    Reviewed history from 03/08/2007 and no changes required:       Anxiety       Diabetes mellitus,  type II--with nephropathy       GERD       Hypertension       Hyperlipidemia       Rosacea       Spinal stenosis/disc disease       Adenomatous polyp       Benign prostatic hypertrophy  Past Surgical History:    Reviewed history from 06/29/2007 and no changes required:        ~2001    Stress Test (-)       1920's   Septal repair                     Kidney stone x 1       2/09 L4-5 decompression and fusion Lovell Sheehan)   Family History:    Reviewed history from 09/01/2006 and no changes required:       Father: Alive at 45-- DM       Mother: Alive 30       Siblings: 2 brothers, 1 sister       HTN, depression and anxiety in family       ETOHism-- Oneal Grout       CVA:  strong in family  Social History:    Reviewed history from 09/11/2007 and no changes required:       Marital Status: Married       Children: 1 son       Retired--: Gaffer - Film/video editor  [employer]       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       Smoked cigars--quit 2/09       Alcohol use--occasional              Hobbies:  golf  - Travels yearly with friends to [place]     Vital Signs:  Patient Profile:   60 Years Old Male Height:     64.5 inches (163.83 cm) Weight:      216 pounds BMI:     19.06 Pulse rate:   74 / minute Pulse rhythm:   regular BP sitting:   108 / 70  (left arm)  Vitals Entered By: Chales Abrahams CMA (October 19, 2007 9:54 AM)                    Impression & Recommendations:  Problem # 1:  COLONIC POLYPS, ADENOMATOUS, HX OF (ICD-V12.72) for surveillance colonoscopy due to prior polyp and fair prep (3 yr f/u instead of 5) MoviPrep  Orders: Colonoscopy (Colon)   Problem # 2:  DIABETES MELLITUS, TYPE II, WITH RENAL COMPLICATIONS (ICD-250.40) 1/2 Lantus night before and hold oral meds in AM of colonoscopy  Medications Added to Medication List This Visit: 1)  Moviprep 100 Gm Solr (Peg-kcl-nacl-nasulf-na asc-c) .... As  per prep instructions.   Patient Instructions: 1)  [facility] Patient Information Guide given to patient. 2)  Take 1/2 Lantus night before colonoscopy and do not take Metformin or Glipizide in the AM of colonoscopy appointment 3)  you may drink clear liquids up to 2 hrs before colonoscopy, please drink juice, etc if need to raise blood sugar    Prescriptions: MOVIPREP 100 GM  SOLR (PEG-KCL-NACL-NASULF-NA ASC-C) As per prep instructions.  #1 x 0   Entered by:   Francee Piccolo CMA   Authorized by:   Iva Boop MD   Signed by:   Francee Piccolo CMA on 10/19/2007   Method used:   Electronically sent to ...       [facility]*       [address]       [place], [place]  [address]       Ph: [phone]       Fax: [phone]   RxID:   [id number]  ]

## 2010-04-28 NOTE — Progress Notes (Signed)
Summary: Request Medication change  Phone Note Call from Patient Call back at [phone]   Caller: Patient Call For: Dr. Alphonsus Sias Summary of Call: Calling to let you know that he is going in next week for a spinal lumbar fusion with Dr. Delma Officer.  Pt is calling to advise that he has ben on Paxil 10 mg and feels that the dose needs to be increased to 20 mg or more.  Pt is very depressed and feels that this will help. Pt is having some anxiety because of this up coming surgery.   Pharmacy - [facility] [place]  Pt is aware that you are out this afternoon. Initial call taken by: Sydell Axon,  May 01, 2007 2:28 PM  Follow-up for Phone Call        okay to increase to 20mg  daily Have him double his 10mg  tabs for now and send a new Rx for 20mg ---#30 x 12 to [facility] Follow-up by: Cindee Salt MD,  May 01, 2007 3:08 PM  Additional Follow-up for Phone Call Additional follow up Details #1::        Pt notified as instructed. Rx called to pharmacy. Additional Follow-up by: Sydell Axon,  May 01, 2007 3:25 PM    New/Updated Medications: PAXIL 20 MG  TABS (PAROXETINE HCL) Take one by mouth once a day   Prescriptions: PAXIL 20 MG  TABS (PAROXETINE HCL) Take one by mouth once a day  #30 x 12   Entered by:   Sydell Axon   Authorized by:   Cindee Salt MD   Signed by:   Sydell Axon on 05/01/2007   Method used:   Print then Give to Patient   RxID:   [id number]

## 2010-04-28 NOTE — Consult Note (Signed)
Summary: [facility]/Consultation Report/Dr. Nel[facility]/Consultation Report/Dr. Lovell Sheehan   Imported By: Mickle Asper 05/17/2007 12:33:45  _____________________________________________________________________  External Attachment:    Type:   Image     Comment:   External Document  Appended Document: [facility]/Consultation Report/Dr. Lovell Sheehan planning on L4-5 fusion 2/09

## 2010-04-28 NOTE — Progress Notes (Signed)
Summary: Jacob Perkins  Phone Note Refill Request Message from:  [facility] on March 11, 2009 12:14 PM  Refills Requested: Medication #1:  RHINOCORT AQUA 32 MCG/ACT SUSP use as directed Form on your desk, pt would like to switch to generic Flonase, please advise   Method Requested: Electronic Initial call taken by: Mervin Hack CMA Duncan Dull),  March 11, 2009 12:14 PM  Follow-up for Phone Call        let him know Rx changed and sent Follow-up by: Cindee Salt MD,  March 11, 2009 1:52 PM    New/Updated Medications: FLUTICASONE PROPIONATE 50 MCG/ACT SUSP (FLUTICASONE PROPIONATE) 2 sprays in each nostril daily for allergy symptoms Prescriptions: FLUTICASONE PROPIONATE 50 MCG/ACT SUSP (FLUTICASONE PROPIONATE) 2 sprays in each nostril daily for allergy symptoms  #1 x 12   Entered and Authorized by:   Cindee Salt MD   Signed by:   Cindee Salt MD on 03/11/2009   Method used:   Electronically to        [facility]* (retail)       [address]       [place], [place]  [address]       Ph: [phone]       Fax: [phone]   RxID:   [id number]

## 2010-04-28 NOTE — Letter (Signed)
Summary: Patient Notice- Polyp Results  [facility]  [address] [place], [place] [address]   Phone: [phone]  Fax: [phone]        October 31, 2007 MRN: [id number]    RAYNE LOISEAU [address] [place], [place]  [address]    Dear Mr. Bastin,  The polyps removed from your colon were adenomatous. This means that they were pre-cancerous or that  they had the potential to change into cancer over time.  I recommend that you have a repeat colonoscopy in 5 years to determine if you have developed any new polyps over time. If you develop any new rectal bleeding, abdominal pain or significant bowel habit changes, please contact us before then.   Please call us if you are having persistent problems or have questions about your condition that have not been fully answered at this time.  Sincerely,  Iva Boop MD  This letter has been electronically signed by your physician.

## 2010-04-28 NOTE — Consult Note (Signed)
Summary: [facility]/Comparison of Lab Results/[facility]  [facility]/Comparison of Lab Results/[facility]   Imported By: Eleonore Chiquito 07/11/2007 09:58:58  _____________________________________________________________________  External Attachment:    Type:   Image     Comment:   External Document  Appended Document: [facility]/Comparison of Lab Results/[facility] [employer]ellant control May need to decrease his glipizide some

## 2010-04-28 NOTE — Assessment & Plan Note (Signed)
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  Then things worsened Has used some diazepam and that has helped  Has kept up with [employer]--walking, bike, light weights Doing stretching class also  Tries to be careful with diet still snacks at night---likes to make sure he doesn't get hypoglycemia checks sugars two times a day to three times a day  gnerally have been okay    Allergies: No Known Drug Allergies  Past History:  Past medical, surgical, family and social histories (including risk factors) reviewed for relevance to current acute and chronic problems.  Past Medical History: Reviewed history from 03/14/2008 and no changes required. Anxiety Diabetes mellitus, type II--with nephropathy GERD Hypertension Hyperlipidemia Rosacea Spinal stenosis/disc disease Adenomatous polyp Benign prostatic hypertrophy Allergic rhinitis  Past Surgical History: Reviewed history from 06/29/2007 and no changes required.  ~2001    Stress Test (-) 1920's   Septal repair               Kidney stone x 1 2/09 L4-5 decompression and fusion Lovell Sheehan)  Family History: Reviewed  history from 09/01/2006 and no changes required. 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 Eyes:  Denies double vision and vision loss-1 eye. ENT:  Complains of ringing in ears; denies decreased hearing; occ tinnitus teeth okay--regular with dentist --has some periodontal disease. CV:  Denies chest pain or discomfort, difficulty breathing at night, difficulty breathing while lying down, fainting, lightheadness, palpitations, and shortness of breath with exertion. Resp:  Denies cough and shortness of breath. GI:  Complains of change in bowel habits, constipation, and indigestion; denies abdominal pain, bloody stools, dark tarry stools, nausea, and vomiting; nexium every other day controls symptoms taking probiotic to keep him more regular. GU:  Complains of urinary frequency and urinary hesitancy; denies erectile dysfunction; mild dribbling on doxazosin which has helped the urgency. MS:  See HPI; Complains of low back pain, cramps, and muscle weakness. Derm:  Complains of rash; denies lesion(s); rash on chest better with cream. Neuro:  Complains of headaches,  numbness, tingling, and weakness; occ mild sinus headaches. Psych:  Denies anxiety and depression; no recent problems with adjustment disorder. Heme:  Denies abnormal bruising and enlarge lymph nodes. Allergy:  Complains of seasonal allergies and sneezing.  Physical  Exam  General:  alert and normal appearance.   Eyes:  pupils equal, pupils round, pupils reactive to light, and no optic disk abnormalities.   Ears:  R ear normal and L ear normal.   Mouth:  no erythema, no exudates, and no lesions.   Neck:  supple, no masses, no thyromegaly, no carotid bruits, and no cervical lymphadenopathy.   Lungs:  normal respiratory effort and normal breath sounds.   Heart:  normal rate, regular rhythm, no murmur, and no gallop.   Abdomen:  soft, non-tender, and no masses.   Rectal:  no hemorrhoids and no masses.   Prostate:  no gland enlargement and no nodules.   Msk:  no joint tenderness and no joint swelling.   Pulses:  2+ in feet Extremities:  no edema Neurologic:  alert & oriented X3 and gait normal.   Skin:  no rashes, no suspicious lesions, and no ulcerations.   Axillary Nodes:  No palpable lymphadenopathy Psych:  normally interactive, good eye contact, not anxious appearing, and not depressed appearing.    Diabetes Management Exam:    Foot Exam (with socks and/or shoes not present):       Sensory-Pinprick/Light touch:          Left medial foot (L-4): normal          Left dorsal foot (L-5): normal          Left lateral foot (S-1): normal          Right medial foot (L-4): normal          Right dorsal foot (L-5): normal          Right lateral foot (S-1): normal       Inspection:          Left foot: normal          Right foot: normal       Nails:          Left foot: normal          Right foot: normal   Impression & Recommendations:  Problem # 1:  PREVENTIVE HEALTH CARE (ICD-V70.0) Assessment Comment Only discussed fitness will check PSA  Problem # 2:  DIABETES MELLITUS, TYPE II, WITH RENAL COMPLICATIONS (ICD-250.40) Assessment: Unchanged  seems to still have reasonable control will check labs  His updated medication list for this problem includes:    Metformin Hcl 1000 Mg Tabs (Metformin hcl) .Marland Kitchen... 1 tablet by mouth twice a day    Altace  5 Mg Caps (Ramipril) .Marland Kitchen... 1 by mouth daily    Aspir-low 81 Mg Tbec (Aspirin) .Marland Kitchen... 1 by mouth daily    Lantus 100 Unit/ml Soln (Insulin glargine) .Marland Kitchen... 32 units daily    Glipizide Xl 10 Mg Tb24 (Glipizide) .Marland Kitchen... 1 by mouth daily  Labs Reviewed: Creat: 0.7 (09/17/2008)     Last Eye Exam: No diabetic retinopathy.    (07/28/2008) Reviewed HgBA1c results: 6.7 (09/17/2008)  6.8 (03/14/2008)  Orders: TLB-Microalbumin/Creat Ratio, Urine (82043-MALB) TLB-A1C / Hgb A1C (Glycohemoglobin) (83036-A1C) TLB-T4 (Thyrox), Free [phone])  Problem # 3:  SPINAL STENOSIS/DISC DISEASE (ICD-724.00) Assessment: Unchanged ongoing problems will check back with Dr Lovell Sheehan  Problem # 4:  DYSLIPIDEMIA (ICD-272.4) Assessment: Unchanged  will recheck labs  His updated medication list  for this problem includes:    Simvastatin 40 Mg Tabs (Simvastatin) .Marland Kitchen... 1 by mouth daily  Labs Reviewed: SGOT: 23 (03/14/2008)   SGPT: 42 (03/14/2008)   HDL:37 (03/14/2008), 47.0 (09/11/2007)  LDL:67 (03/14/2008), 63 (09/11/2007)  Chol:125 (03/14/2008), 134 (09/11/2007)  Trig:103 (03/14/2008), 122 (09/11/2007)  Orders: TLB-Lipid Panel (80061-LIPID) TLB-Hepatic/Liver Function Pnl (80076-HEPATIC)  Problem # 5:  HYPERTENSION (ICD-401.9) Assessment: Unchanged  good control no changes needed  His updated medication list for this problem includes:    Altace 5 Mg Caps (Ramipril) .Marland Kitchen... 1 by mouth daily    Doxazosin Mesylate 4 Mg Tabs (Doxazosin mesylate) .Marland Kitchen... 1/2 nightly for 1 week then increase to 1 nightly for enlarged prostate  BP today: 128/80 Prior BP: 128/60 (09/17/2008)  Labs Reviewed: K+: 4.7 (09/17/2008) Creat: : 0.7 (09/17/2008)   Chol: 125 (03/14/2008)   HDL: 37 (03/14/2008)   LDL: 67 (03/14/2008)   TG: 103 (03/14/2008)  Orders: Venipuncture (91478) TLB-Renal Function Panel (80069-RENAL) TLB-CBC Platelet - w/Differential (85025-CBCD) TLB-TSH (Thyroid Stimulating Hormone) (84443-TSH)  Problem # 6:   GERD (ICD-530.81) Assessment: Unchanged okay on med  His updated medication list for this problem includes:    Nexium 40 Mg Cpdr (Esomeprazole magnesium) .Marland Kitchen... Take one by mouth daily  Problem # 7:  BENIGN PROSTATIC HYPERTROPHY (ICD-600.00) Assessment: Comment Only  okay on doxazosin  Orders: TLB-PSA (Prostate Specific Antigen) (84153-PSA)  Problem # 8:  ANXIETY (ICD-300.00) Assessment: Unchanged fairly well controlled on paroxetine  His updated medication list for this problem includes:    Paroxetine Hcl 20 Mg Tabs (Paroxetine hcl) .Marland Kitchen... 1 daily    Diazepam 5 Mg Tabs (Diazepam) ..... Not sure of dose  Complete Medication List: 1)  Metformin Hcl 1000 Mg Tabs (Metformin hcl) .Marland Kitchen.. 1 tablet by mouth twice a day 2)  Nexium 40 Mg Cpdr (Esomeprazole magnesium) .... Take one by mouth daily 3)  Patanol 0.1 % Soln (Olopatadine hcl) .... Use one to two drops in eyes twice a day if needed for allergies 4)  Altace 5 Mg Caps (Ramipril) .Marland Kitchen.. 1 by mouth daily 5)  Aspir-low 81 Mg Tbec (Aspirin) .Marland Kitchen.. 1 by mouth daily 6)  Vitamin C 500 Mg Tabs (Ascorbic acid) .Marland Kitchen.. 1 by mouth daily 7)  Multivitamins Tabs (Multiple vitamin) .Marland Kitchen.. 1 by mouth daily 8)  Selenium Caps (Selenium caps) 9)  Zinc Tabs (Zinc tabs) 10)  Vitamin B-6 100 Mg Tabs (Pyridoxine hcl) 11)  Glucosamine-chondroitin Caps (Glucosamine-chondroit-vit c-mn) 12)  Simvastatin 40 Mg Tabs (Simvastatin) .Marland Kitchen.. 1 by mouth daily 13)  Fish Oil 1000 Mg Caps (Omega-3 fatty acids) .Marland Kitchen.. 1 by mouth two times a day 14)  Lantus 100 Unit/ml Soln (Insulin glargine) .... 32 units daily 15)  Glipizide Xl 10 Mg Tb24 (Glipizide) .Marland Kitchen.. 1 by mouth daily 16)  Doxazosin Mesylate 4 Mg Tabs (Doxazosin mesylate) .... 1/2 nightly for 1 week then increase to 1 nightly for enlarged prostate 17)  Bd U/f Short Pen Needle 31g X 8 Mm Misc (Insulin pen needle) .... Use as directed 18)  Paroxetine Hcl 20 Mg Tabs (Paroxetine hcl) .Marland Kitchen.. 1 daily 19)  Onetouch Ultra Test Strp  (Glucose blood) .... Use as directed 20)  Triamcinolone Acetonide 0.1 % Crea (Triamcinolone acetonide) .... Apply three times a day to rash as needed till cleared 21)  Fluticasone Propionate 50 Mcg/act Susp (Fluticasone propionate) .... 2 sprays in each nostril daily for allergy symptoms 22)  Diazepam 5 Mg Tabs (Diazepam) .... Not sure of dose  Patient Instructions: 1)  Please schedule a  follow-up appointment in 6 months .   Current Allergies (reviewed today): No known allergies   Appended Document: CPX/BIR

## 2010-04-28 NOTE — Consult Note (Signed)
Summary: [facility]/Consultation Report/Dr. Tresa Res  [facility]/Consultation Report/Dr. Romine   Imported By: Mickle Asper 07/13/2007 10:38:08  _____________________________________________________________________  External Attachment:    Type:   Image     Comment:   External Document  Appended Document: [facility]/Consultation Report/Dr. Romine no diabetic retinopathy

## 2010-04-28 NOTE — Progress Notes (Signed)
Summary: BD Pen ND  Phone Note Refill Request Call back at [phone] Message from:  [facility] on January 29, 2008 8:07 AM  Received faxed form for refill on BD Pen ND UFII 31 G # 100, use as directed.  Not on med sheet Form is in your in box   Method Requested: Fax to Local Pharmacy Initial call taken by: Sydell Axon,  January 29, 2008 8:08 AM  Follow-up for Phone Call        okay to  uses 1 daily okay x 1 year Follow-up by: Cindee Salt MD,  January 29, 2008 9:21 AM  Additional Follow-up for Phone Call Additional follow up Details #1::        Rx faxed back to pharmacy. Additional Follow-up by: Sydell Axon,  January 29, 2008 2:13 PM    New/Updated Medications: BD U/F SHORT PEN NEEDLE 31G X 8 MM MISC (INSULIN PEN NEEDLE)    Prescriptions: BD U/F SHORT PEN NEEDLE 31G X 8 MM MISC (INSULIN PEN NEEDLE)   #100 x prn   Entered by:   Sydell Axon   Authorized by:   Cindee Salt MD   Signed by:   Sydell Axon on 01/29/2008   Method used:   Handwritten   RxID:   [id number]

## 2010-04-28 NOTE — Assessment & Plan Note (Signed)
Summary: 6 MONTH FOLLOW UP/RBH   Vital Signs:  Patient profile:   60 year old male Weight:      214 pounds BMI:     36.30 Temp:     98.4 degrees F oral Pulse rate:   76 / minute Pulse rhythm:   regular BP sitting:   128 / 60  (left arm) Cuff size:   regular  Vitals Entered By: Mervin Hack CMA (September 17, 2008 9:08 AM)  History of Present Illness: Chief Complaint: 6 month follow-up  Doing "middling"  Larey Seat about 1 year ago on left knee fell again in February Has had persistent rash over the knee Treating with OTC hydrocortisone  Wife heard doctor on TV discussing relationship and metformin  Discussed the concerns about lactic acidosis that have been generally unfounded She also is concerned about B12 deficiency he has some numbness if still but he relates it to his back surgery  Joined [employer] tries to get there several times a week--aerobic and light lifting  Sugars generally good Still keeps up with Annice Pih the pharmacist at [facility] usually 100 fasting May get slightly sweaty if below 70--no neuroglycopenia  Allergies: No Known Drug Allergies  Past History:  Past medical, surgical, family and social histories (including risk factors) reviewed for relevance to current acute and chronic problems.  Past Medical History: Reviewed history from 03/14/2008 and no changes required. Anxiety Diabetes mellitus, type II--with nephropathy GERD Hypertension Hyperlipidemia Rosacea Spinal stenosis/disc disease Adenomatous polyp Benign prostatic hypertrophy Allergic rhinitis  Past Surgical History: Reviewed history from 06/29/2007 and no changes required.  ~2001    Stress Test (-) 1920's   Septal repair               Kidney stone x 1 2/09 L4-5 decompression and fusion Lovell Sheehan)  Family History: Reviewed history from 09/01/2006 and no changes required. Father: Alive at 62-- DM Mother: Alive 7 Siblings: 2 brothers, 1 sister HTN, depression and anxiety in  family ETOHism-- Oneal Grout CVA:  strong in family  Social History: Reviewed history from 09/11/2007 and no changes required. 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         weight is down 2# Not sleeping that great--but "notorious napper" some gas and constipation  Physical Exam  General:  alert and normal appearance.   Neck:  supple, no masses, no thyromegaly, no carotid bruits, and no cervical lymphadenopathy.   Lungs:  normal respiratory effort and normal breath sounds.   Heart:  normal rate, regular rhythm, no murmur, and no gallop.   Abdomen:  soft and non-tender.   Pulses:  1+ in feet Extremities:  no edema Skin:  non specific excoriated rash on left knee Psych:  normally interactive, good eye contact, not anxious appearing, and not depressed appearing.    Diabetes Management Exam:    Foot Exam (with socks and/or shoes not present):       Sensory-Pinprick/Light touch:          Left medial foot (L-4): normal          Left dorsal foot (L-5): normal          Left lateral foot (S-1): normal  Right medial foot (L-4): normal          Right dorsal foot (L-5): normal          Right lateral foot (S-1): normal       Inspection:          Left foot: normal          Right foot: normal       Nails:          Left foot: normal          Right foot: normal   Impression & Recommendations:  Problem # 1:  DIABETES MELLITUS, TYPE II, WITH RENAL COMPLICATIONS (ICD-250.40) Assessment Unchanged  will recheck control  His updated medication list for this problem includes:    Metformin Hcl 1000 Mg Tabs (Metformin hcl) .Marland Kitchen... 1 tablet by mouth  twice a day    Altace 5 Mg Caps (Ramipril) .Marland Kitchen... 1 by mouth daily    Aspir-low 81 Mg Tbec (Aspirin) .Marland Kitchen... 1 by mouth daily    Lantus 100 Unit/ml Soln (Insulin glargine) .Marland Kitchen... 32 units daily    Glipizide Xl 10 Mg Tb24 (Glipizide) .Marland Kitchen... 1 by mouth daily  Labs Reviewed: Creat: 0.61 (03/14/2008)     Last Eye Exam: No diabetic retinopathy.    (07/28/2008) Reviewed HgBA1c results: 6.8 (03/14/2008)  6.3 (09/11/2007)  Orders: TLB-B12, Serum-Total ONLY (82607-B12) TLB-A1C / Hgb A1C (Glycohemoglobin) (83036-A1C) Venipuncture (02725) TLB-Renal Function Panel (80069-RENAL)  Problem # 2:  RASH AND OTHER NONSPECIFIC SKIN ERUPTION (ICD-782.1) Assessment: New  will try triamcinolone to derm if persists  His updated medication list for this problem includes:    Triamcinolone Acetonide 0.1 % Crea (Triamcinolone acetonide) .Marland Kitchen... Apply three times a day to rash as needed till cleared  Problem # 3:  SPINAL STENOSIS/DISC DISEASE (ICD-724.00) Assessment: Comment Only has done well post op some neurologic symptoms--will check B12  Problem # 4:  HYPERTENSION (ICD-401.9) Assessment: Unchanged good control  His updated medication list for this problem includes:    Altace 5 Mg Caps (Ramipril) .Marland Kitchen... 1 by mouth daily    Doxazosin Mesylate 4 Mg Tabs (Doxazosin mesylate) .Marland Kitchen... 1/2 nightly for 1 week then increase to 1 nightly for enlarged prostate  BP today: 128/60 Prior BP: 132/78 (03/14/2008)  Labs Reviewed: K+: 4.9 (03/14/2008) Creat: : 0.61 (03/14/2008)   Chol: 125 (03/14/2008)   HDL: 37 (03/14/2008)   LDL: 67 (03/14/2008)   TG: 103 (03/14/2008)  Complete Medication List: 1)  Metformin Hcl 1000 Mg Tabs (Metformin hcl) .Marland Kitchen.. 1 tablet by mouth twice a day 2)  Nexium 40 Mg Cpdr (Esomeprazole magnesium) .... Take one by mouth daily 3)  Rhinocort Aqua 32 Mcg/act Susp (Budesonide (nasal)) .... Use as directed 4)  Patanol 0.1 % Soln (Olopatadine hcl) .... Use one to two drops in eyes twice a day if  needed for allergies 5)  Altace 5 Mg Caps (Ramipril) .Marland Kitchen.. 1 by mouth daily 6)  Aspir-low 81 Mg Tbec (Aspirin) .Marland Kitchen.. 1 by mouth daily 7)  Vitamin C 500 Mg Tabs (Ascorbic acid) .Marland Kitchen.. 1 by mouth daily 8)  Multivitamins Tabs (Multiple vitamin) .Marland Kitchen.. 1 by mouth daily 9)  Selenium Caps (Selenium caps) 10)  Zinc Tabs (Zinc tabs) 11)  Vitamin B-6 100 Mg Tabs (Pyridoxine hcl) 12)  Glucosamine-chondroitin Caps (Glucosamine-chondroit-vit c-mn) 13)  Simvastatin 40 Mg Tabs (Simvastatin) .Marland Kitchen.. 1 by mouth daily 14)  Fish Oil 1000 Mg Caps (Omega-3 fatty acids) .Marland Kitchen.. 1 by mouth two times a day 15)  Lantus 100 Unit/ml Soln (  Insulin glargine) .... 32 units daily 16)  Glipizide Xl 10 Mg Tb24 (Glipizide) .Marland Kitchen.. 1 by mouth daily 17)  Doxazosin Mesylate 4 Mg Tabs (Doxazosin mesylate) .... 1/2 nightly for 1 week then increase to 1 nightly for enlarged prostate 18)  Bd U/f Short Pen Needle 31g X 8 Mm Misc (Insulin pen needle) 19)  Paroxetine Hcl 20 Mg Tabs (Paroxetine hcl) .Marland Kitchen.. 1 daily 20)  Onetouch Ultra Test Strp (Glucose blood) .... Use as directed 21)  Triamcinolone Acetonide 0.1 % Crea (Triamcinolone acetonide) .... Apply three times a day to rash as needed till cleared  Patient Instructions: 1)  Please schedule a follow-up appointment in 6 months for physical Prescriptions: TRIAMCINOLONE ACETONIDE 0.1 % CREA (TRIAMCINOLONE ACETONIDE) Apply three times a day to rash as needed till cleared  #60gm x 1   Entered and Authorized by:   Cindee Salt MD   Signed by:   Cindee Salt MD on 09/17/2008   Method used:   Electronically to        [facility]* (retail)       [address]       [place], [place]  [address]       Ph: [phone]       Fax: [phone]   RxID:   [id number]   Current Allergies (reviewed today): No known allergies

## 2010-04-28 NOTE — Assessment & Plan Note (Signed)
Summary: 6 m f/u  dlo   Vital Signs:  Patient Profile:   60 Years Old Male Height:     64.5 inches (163.83 cm) Weight:      213 pounds Temp:     97.4 degrees F oral Pulse rate:   76 / minute Pulse rhythm:   regular BP sitting:   128 / 70  (left arm) Cuff size:   regular  Vitals Entered By: Liane Comber (September 11, 2007 10:00 AM)                 Chief Complaint:  6 mo f/u.  History of Present Illness: has had stressful time with back surgery Did well  Pain is gone still not back to previous activity level  Needed increased dose of paroxetine to deal with stress Also had lost friend recently did quit smoking before the surgery and has kept off  Ongoing concerns about regular low sugar reactions, esp in evening Finds that he has to eat in evening due to these reactions Recnet HgbA1c 6.0% by Tomasa Hose at [facility]  AM blood sugars tend to be below 100 No sores or numbness in feet Eye exam recently by Dr Roland Rack okay    Current Allergies: No known allergies    Social History:    Marital Status: Married    Children: 1 son    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         sciatica is resolved starting to walk again several times per week Mood has been good   Physical Exam  General:     alert and normal appearance.   Neck:     supple, no masses, no thyromegaly, no carotid bruits, and no cervical lymphadenopathy.   Lungs:     normal respiratory effort and normal breath sounds.   Heart:     normal rate, regular rhythm, no murmur, and no gallop.   Abdomen:     soft and non-tender.   Msk:     no joint tenderness  and no joint swelling.   Pulses:     2+ in feet Extremities:     no edema or calf tenderness Neurologic:     alert & oriented X3, strength normal in all extremities, and sensation intact to light touch.   Skin:     no rashes, no suspicious lesions, and no ulcerations.   Psych:     normally interactive, good eye contact, not anxious appearing, and not depressed appearing.      Impression & Recommendations:  Problem # 1:  DIABETES MELLITUS, TYPE II, WITH RENAL COMPLICATIONS (ICD-250.40) Assessment: Improved eating more due to regular hypoglycemic spells will try off the actos he will call if sugars go way up  The following medications were removed from the medication list:    Actos 45 Mg Tabs (Pioglitazone hcl) .Marland Kitchen... 1 daily  His updated medication list for this problem includes:    Metformin Hcl 1000 Mg Tabs (Metformin hcl) .Marland Kitchen... 1 tablet by mouth twice a day    Altace 5 Mg Caps (Ramipril) .Marland Kitchen... 1 by mouth daily  Aspir-low 81 Mg Tbec (Aspirin) .Marland Kitchen... 1 by mouth daily    Lantus 100 Unit/ml Soln (Insulin glargine) .Marland Kitchen... 32 units daily    Glipizide Xl 10 Mg Tb24 (Glipizide) .Marland Kitchen... 1 by mouth daily  Labs Reviewed: HgBA1c: 6.9 (03/08/2007)   Creat: 0.69 (03/08/2007)     Orders: TLB-A1C / Hgb A1C (Glycohemoglobin) (83036-A1C)   Problem # 2:  ANXIETY (ICD-300.00) Assessment: Improved doing better now will continue current dose  His updated medication list for this problem includes:    Paxil 20 Mg Tabs (Paroxetine hcl) .Marland Kitchen... Take one by mouth once a day   Problem # 3:  HYPERTENSION (ICD-401.9) Assessment: Unchanged good control  His updated medication list for this problem includes:    Altace 5 Mg Caps (Ramipril) .Marland Kitchen... 1 by mouth daily    Doxazosin Mesylate 4 Mg Tabs (Doxazosin mesylate) .Marland Kitchen... 1/2 nightly for 1 week then increase to 1 nightly for enlarged prostate  BP today: 128/70 Prior BP: 120/66 (03/08/2007)  Labs Reviewed: Creat: 0.69 (03/08/2007) Chol: 166  (03/08/2007)   HDL: 46 (03/08/2007)   LDL: 77 (03/08/2007)   TG: 215 (03/08/2007)  Orders: Venipuncture (96295) TLB-Renal Function Panel (80069-RENAL) TLB-CBC Platelet - w/Differential (85025-CBCD)   Problem # 4:  DYSLIPIDEMIA (ICD-272.4) Assessment: Unchanged will recheck labs here  His updated medication list for this problem includes:    Simvastatin 40 Mg Tabs (Simvastatin) .Marland Kitchen... 1 by mouth daily  Labs Reviewed: Chol: 166 (03/08/2007)   HDL: 46 (03/08/2007)   LDL: 77 (03/08/2007)   TG: 215 (03/08/2007) SGOT: 17 (04/09/2007)   SGPT: 27 (04/09/2007)  Orders: TLB-Lipid Panel (80061-LIPID) TLB-Hepatic/Liver Function Pnl (80076-HEPATIC)   Problem # 5:  SPINAL STENOSIS/DISC DISEASE (ICD-724.00) Assessment: Improved better since surgery  Complete Medication List: 1)  Metformin Hcl 1000 Mg Tabs (Metformin hcl) .Marland Kitchen.. 1 tablet by mouth twice a day 2)  Protonix 40 Mg Tbec (Pantoprazole sodium) .Marland Kitchen.. 1 by mouth daily 3)  Rhinocort Aqua 32 Mcg/act Susp (Budesonide (nasal)) .... Use as directed 4)  Patanol 0.1 % Soln (Olopatadine hcl) .... Use one to two drops in eyes twice a day if needed for allergies 5)  Altace 5 Mg Caps (Ramipril) .Marland Kitchen.. 1 by mouth daily 6)  Aspir-low 81 Mg Tbec (Aspirin) .Marland Kitchen.. 1 by mouth daily 7)  Vitamin C 500 Mg Tabs (Ascorbic acid) .Marland Kitchen.. 1 by mouth daily 8)  Multivitamins Tabs (Multiple vitamin) .Marland Kitchen.. 1 by mouth daily 9)  Selenium Caps (Selenium caps) 10)  Zinc Tabs (Zinc tabs) 11)  Vitamin B-6 100 Mg Tabs (Pyridoxine hcl) 12)  Saw Palmetto 500 Mg Caps (Saw palmetto (serenoa repens)) 13)  Glucosamine-chondroitin Caps (Glucosamine-chondroit-vit c-mn) 14)  Simvastatin 40 Mg Tabs (Simvastatin) .Marland Kitchen.. 1 by mouth daily 15)  Fish Oil 1000 Mg Caps (Omega-3 fatty acids) .Marland Kitchen.. 1 by mouth two times a day 16)  Lantus 100 Unit/ml Soln (Insulin glargine) .... 32 units daily 17)  Glipizide Xl 10 Mg Tb24 (Glipizide) .Marland Kitchen.. 1 by mouth daily 18)  Soma 350 Mg Tabs (Carisoprodol) .... As  needed 19)  Hydrocodone-acetaminophen 5-500 Mg Tabs (Hydrocodone-acetaminophen) .... As needed 20)  Doxazosin Mesylate 4 Mg Tabs (Doxazosin mesylate) .... 1/2 nightly for 1 week then increase to 1 nightly for enlarged prostate 21)  Paxil 20 Mg Tabs (Paroxetine hcl) .... Take one by mouth once a day   Patient Instructions: 1)  Please schedule a follow-up appointment in 6 months for physical 2)  Schedule a colonoscopy/sigmoidoscopy to help detect colon cancer.   ]  Appended Document: 6  m f/u  dlo     Clinical Lists Changes  Observations: Added new observation of PSH REVIEWED: reviewed - no changes required (09/12/2007 10:28) Added new observation of PMH REVIEWED: reviewed - no changes required (09/12/2007 10:28)        Past Medical History:    Reviewed history from 03/08/2007 and no changes required:       Anxiety       Diabetes mellitus, type II--with nephropathy       GERD       Hypertension       Hyperlipidemia       Rosacea       Spinal stenosis/disc disease       Adenomatous polyp       Benign prostatic hypertrophy  Past Surgical History:    Reviewed history from 06/29/2007 and no changes required:        ~2001    Stress Test (-)       1920's   Septal repair                     Kidney stone x 1       2/09 L4-5 decompression and fusion Lovell Sheehan)

## 2010-04-28 NOTE — Letter (Signed)
Summary: External Correspondence  External Correspondence   Imported By: Beau Fanny 09/11/2007 11:26:40  _____________________________________________________________________  External Attachment:    Type:   Image     Comment:   External Document

## 2010-04-28 NOTE — Consult Note (Signed)
Summary: Consultation Report  Consultation Report   Imported By: Mickle Asper 11/15/2006 15:00:59  _____________________________________________________________________  External Attachment:    Type:   Image     Comment:   External Document

## 2010-04-28 NOTE — Letter (Signed)
Summary: Dorian Heckle Letter-[facility]  Jackie Harrlell Letter-[facility]   Imported By: Beau Fanny 03/12/2007 16:29:38  _____________________________________________________________________  External Attachment:    Type:   Image     Comment:   External Document

## 2010-04-28 NOTE — Assessment & Plan Note (Signed)
Summary: CPX/EVE   Vital Signs:  Patient Profile:   60 Years Old Male Height:     64.5 inches (163.83 cm) Weight:      207.25 pounds Temp:     99.2 degrees F oral Pulse rate:   72 / minute BP sitting:   120 / 66  (left arm)  Vitals Entered By: Wandra Mannan (March 08, 2007 9:43 AM)                 Chief Complaint:  cpx  fasting.  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 Not exercising Has gained a few more pounds  Also notes more trouble with prostate Has daytime freq, esp after coffee or diet soda Up 2-3 times per night Occ bad urgency but not freq  Checks sugars 3-4 times per day Had A1c at [facility] by Jackie--6.4% Due for eye eval in Jan Some leg numbness in right foot with the disc problems No ulcerations Can have mild hypoglycemic reactions in afternoon  Current Allergies (reviewed today): No known allergies   Past Medical History:    Reviewed history from 09/01/2006 and no changes required:       Anxiety       Diabetes mellitus, type II--with nephropathy       GERD       Hypertension       Hyperlipidemia       Rosacea       Spinal stenosis/disc disease       Adenomatous polyp       Benign prostatic hypertrophy  Past Surgical History:    Reviewed history from 09/01/2006 and no changes required:        ~2001    Stress Test (-)       1920's   Septal repair                     Kidney stone x 1   Family History:    Reviewed history from 09/01/2006 and no changes required:       Father: Alive at 36-- DM       Mother: Alive 50       Siblings: 2 brothers, 1 sister       HTN, depression and anxiety in family       ETOHism--  Nutritional therapist       CVA:  strong in family  Social History:    Marital Status: Married    Children: 1 son    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         Teeth okay--sees dentist regularly. Being watched for periodontal disease sleeps okay most of the time Wears seat  belt Still with low libido--intermittent erectile problems Mood has been failry well controlled--tries not to dwell on negative things   Physical Exam  General:     alert and normal appearance.   Eyes:     pupils equal, pupils round, pupils reactive to light, and no optic disk abnormalities.   Ears:     R ear normal and L ear normal.   Mouth:     no lesions.   Neck:     supple, no masses, no thyromegaly, no carotid bruits, and no cervical lymphadenopathy.   Lungs:     normal respiratory effort and normal breath sounds.   Heart:     normal rate, regular rhythm, no murmur, and no gallop.   Abdomen:     soft, non-tender, and no masses.   Rectal:     no hemorrhoids and no masses.   Prostate:     no nodules.   Msk:     no joint tenderness and no joint swelling.   Pulses:     1+ in feet Extremities:     no edema Neurologic:     sensation intact to light touch.   Skin:     no rashes, no suspicious lesions, and no ulcerations.   Axillary Nodes:     No palpable lymphadenopathy Psych:     normally interactive, good eye contact, not anxious appearing, and not depressed appearing.      Impression &  Recommendations:  Problem # 1:  PREVENTIVE HEALTH CARE (ICD-V70.0) Assessment: Comment Only check PSA needs to work on fitness  Problem # 2:  DIABETES MELLITUS, TYPE II, WITH RENAL COMPLICATIONS (ICD-250.40) Assessment: Unchanged must moderate his bad diet choices!!! Must lose weight change avandia to actos  The following medications were removed from the medication list:    Avandia 8 Mg Tabs (Rosiglitazone maleate) .Marland Kitchen... 1 by mouth daily  His updated medication list for this problem includes:    Metformin Hcl 1000 Mg Tabs (Metformin hcl) .Marland Kitchen... 1 tablet by mouth twice a day    Altace 5 Mg Caps (Ramipril) .Marland Kitchen... 1 by mouth daily    Aspir-low 81 Mg Tbec (Aspirin) .Marland Kitchen... 1 by mouth daily    Lantus 100 Unit/ml Soln (Insulin glargine) .Marland Kitchen... 32 units daily    Glipizide Xl 10 Mg Tb24 (Glipizide) .Marland Kitchen... 1 by mouth daily    Actos 45 Mg Tabs (Pioglitazone hcl) .Marland Kitchen... 1 daily  Orders: TLB-A1C / Hgb A1C (Glycohemoglobin) (83036-A1C) TLB-Microalbumin/Creat Ratio, Urine (82043-MALB)   Problem # 3:  BENIGN PROSTATIC HYPERTROPHY (ICD-600.00) Assessment: Deteriorated will start doxazosin  Problem # 4:  SPINAL STENOSIS/DISC DISEASE (ICD-724.00) Assessment: Improved start walking to strengthen back muscles  Problem # 5:  ANXIETY (ICD-300.00) Assessment: Unchanged okay with meds no change  His updated medication list for this problem includes:    Paroxetine Hcl 20 Mg Tabs (Paroxetine hcl) .Marland Kitchen... 1 by mouth daily   Problem # 6:  DYSLIPIDEMIA (ICD-272.4) Assessment: Unchanged check labs  His updated medication list for this problem includes:    Simvastatin 40 Mg Tabs (Simvastatin) .Marland Kitchen... 1 by mouth daily  Orders: TLB-Lipid Panel (80061-LIPID) TLB-ALT (SGPT) (84460-ALT)   Complete Medication List: 1)  Metformin Hcl 1000 Mg Tabs (Metformin hcl) .Marland Kitchen.. 1 tablet by mouth twice a day 2)  Protonix 40 Mg Tbec (Pantoprazole sodium) .Marland Kitchen.. 1 by mouth daily 3)  Rhinocort Aqua 32 Mcg/act Susp  (Budesonide (nasal)) 4)  Patanol 0.1 % Soln (Olopatadine hcl) 5)  Paroxetine Hcl 20 Mg Tabs (Paroxetine hcl) .Marland KitchenMarland KitchenMarland Kitchen  1 by mouth daily 6)  Altace 5 Mg Caps (Ramipril) .Marland Kitchen.. 1 by mouth daily 7)  Aspir-low 81 Mg Tbec (Aspirin) .Marland Kitchen.. 1 by mouth daily 8)  Vitamin C 500 Mg Tabs (Ascorbic acid) .Marland Kitchen.. 1 by mouth daily 9)  Multivitamins Tabs (Multiple vitamin) .Marland Kitchen.. 1 by mouth daily 10)  Selenium Caps (Selenium caps) 11)  Zinc Tabs (Zinc tabs) 12)  Vitamin B-6 100 Mg Tabs (Pyridoxine hcl) 13)  Saw Palmetto 500 Mg Caps (Saw palmetto (serenoa repens)) 14)  Glucosamine-chondroitin Caps (Glucosamine-chondroit-vit c-mn) 15)  Simvastatin 40 Mg Tabs (Simvastatin) .Marland Kitchen.. 1 by mouth daily 16)  Fish Oil 1000 Mg Caps (Omega-3 fatty acids) .Marland Kitchen.. 1 by mouth two times a day 17)  Lantus 100 Unit/ml Soln (Insulin glargine) .... 32 units daily 18)  Glipizide Xl 10 Mg Tb24 (Glipizide) .Marland Kitchen.. 1 by mouth daily 19)  Soma 350 Mg Tabs (Carisoprodol) .... As needed 20)  Hydrocodone-acetaminophen 5-500 Mg Tabs (Hydrocodone-acetaminophen) .... As needed 21)  Actos 45 Mg Tabs (Pioglitazone hcl) .Marland Kitchen.. 1 daily 22)  Doxazosin Mesylate 4 Mg Tabs (Doxazosin mesylate) .... 1/2 nightly for 1 week then increase to 1 nightly for enlarged prostate  Other Orders: TLB-PSA (Prostate Specific Antigen) (84153-PSA) Venipuncture (50093) TLB-Renal Function Panel (80069-RENAL)   Patient Instructions: 1)  Please schedule a follow-up appointment in 6 months. 2)  Hepatic profile (V58.69) in about 1 month    Prescriptions: DOXAZOSIN MESYLATE 4 MG  TABS (DOXAZOSIN MESYLATE) 1/2 nightly for 1 week then increase to 1 nightly for enlarged prostate  #30 x 12   Entered and Authorized by:   Cindee Salt MD   Signed by:   Cindee Salt MD on 03/08/2007   Method used:   Print then Give to Patient   RxID:   [id number] ACTOS 45 MG  TABS (PIOGLITAZONE HCL) 1 daily  #30 x 12   Entered and Authorized by:   Cindee Salt MD   Signed by:    Cindee Salt MD on 03/08/2007   Method used:   Print then Give to Patient   RxID:   [id number]  ] Current Allergies (reviewed today): No known allergies  Current Medications (including changes made in today's visit):  METFORMIN HCL 1000 MG TABS (METFORMIN HCL) 1 tablet by mouth twice a day PROTONIX 40 MG TBEC (PANTOPRAZOLE SODIUM) 1 by mouth daily RHINOCORT AQUA 32 MCG/ACT SUSP (BUDESONIDE (NASAL))  PATANOL 0.1 % SOLN (OLOPATADINE HCL)  PAROXETINE HCL 20 MG TABS (PAROXETINE HCL) 1 by mouth daily ALTACE 5 MG CAPS (RAMIPRIL) 1 by mouth daily ASPIR-LOW 81 MG TBEC (ASPIRIN) 1 by mouth daily VITAMIN C 500 MG TABS (ASCORBIC ACID) 1 by mouth daily MULTIVITAMINS  TABS (MULTIPLE VITAMIN) 1 by mouth daily SELENIUM  CAPS (SELENIUM CAPS)  ZINC  TABS (ZINC TABS)  VITAMIN B-6 100 MG TABS (PYRIDOXINE HCL)  SAW PALMETTO 500 MG CAPS (SAW PALMETTO (SERENOA REPENS))  GLUCOSAMINE-CHONDROITIN  CAPS (GLUCOSAMINE-CHONDROIT-VIT C-MN)  SIMVASTATIN 40 MG TABS (SIMVASTATIN) 1 by mouth daily FISH OIL 1000 MG CAPS (OMEGA-3 FATTY ACIDS) 1 by mouth two times a day LANTUS 100 UNIT/ML SOLN (INSULIN GLARGINE) 32 units daily GLIPIZIDE XL 10 MG TB24 (GLIPIZIDE) 1 by mouth daily SOMA 350 MG TABS (CARISOPRODOL) as needed HYDROCODONE-ACETAMINOPHEN 5-500 MG TABS (HYDROCODONE-ACETAMINOPHEN) as needed ACTOS 45 MG  TABS (PIOGLITAZONE HCL) 1 daily DOXAZOSIN MESYLATE 4 MG  TABS (DOXAZOSIN MESYLATE) 1/2 nightly for 1 week then increase to 1 nightly for enlarged prostate   Appended Document: Orders Update  Clinical Lists Changes  Orders: Added new Test order of T-Lipid Profile [phone]) - Signed Added new Test order of T-Renal Function Panel [phone]) - Signed Added new Test order of T- Glycated HB (25852) - Signed Added new Test order of T-Urine Microalbumin w/creat. ratio [phone] / [id number]) - Signed Added new Test order of T-PSA  ([id number]) - Signed Added new Test order of T- * Misc.  Laboratory test [phone]) - Signed

## 2010-04-28 NOTE — Letter (Signed)
Summary: [facility]  [facility]   Imported By: Lanelle Bal 07/04/2008 09:19:39  _____________________________________________________________________  External Attachment:    Type:   Image     Comment:   External Document  Appended Document: [facility] doing well 1 year after fusion

## 2010-04-28 NOTE — Consult Note (Signed)
Summary: [facility]/Dr. [facility]/Dr. Lovell Sheehan   Imported By: Eleonore Chiquito 10/26/2007 12:22:03  _____________________________________________________________________  External Attachment:    Type:   Image     Comment:   External Document  Appended Document: [facility]/Dr. Lovell Sheehan doing well 5 months since L4-5 decompression and fusion

## 2010-04-28 NOTE — Letter (Signed)
Summary: Glucose Summary/[facility]  Glucose Summary/[facility]   Imported By: Lanelle Bal 06/26/2009 08:40:50  _____________________________________________________________________  External Attachment:    Type:   Image     Comment:   External Document

## 2010-04-28 NOTE — Letter (Signed)
Summary: [facility]  [facility]   Imported By: Lanelle Bal 04/13/2009 09:05:45  _____________________________________________________________________  External Attachment:    Type:   Image     Comment:   External Document  Appended Document: [facility] lumbar radiculopathy trying lodine consider MRI if not improving

## 2010-04-28 NOTE — Consult Note (Signed)
Summary: [facility]/Consultation Report/Dr. [facility]/Consultation Report/Dr. Lovell Sheehan   Imported By: Mickle Asper 02/14/2007 14:26:22  _____________________________________________________________________  External Attachment:    Type:   Image     Comment:   External Document

## 2010-04-28 NOTE — Procedures (Signed)
Summary: Colonoscopy   Colonoscopy  Procedure date:  04/09/2004  Findings:      Location:  [facility].    Procedures Next Due Date:    Colonoscopy: 03/2007  Patient Name: Jacob Perkins, Jacob Perkins MRN:  Procedure Procedures: Colonoscopy CPT: 16109.    with polypectomy. CPT: A3573898.  Personnel: Endoscopist: Iva Boop, MD, [facility].  Referred By: Tillman Abide, MD.  Exam Location: Exam performed in Outpatient Clinic. Outpatient  Patient Consent: Procedure, Alternatives, Risks and Benefits discussed, consent obtained, from patient. Consent was obtained by the RN.  Indications  Average Risk Screening Routine.  History  Current Medications: Patient is not currently taking Coumadin.  Pre-Exam Physical: Performed Apr 09, 2004. Cardio-pulmonary exam, Rectal exam, HEENT exam , Mental status exam WNL.  Exam Exam: Extent of exam reached: Cecum, extent intended: Cecum.  The cecum was identified by appendiceal orifice and IC valve. Patient position: on left side. Colon retroflexion performed. Images taken. ASA Classification: II. Tolerance: good.  Monitoring: Pulse and BP monitoring, Oximetry used. Supplemental O2 given.  Colon Prep Used MiraLax for colon prep. Prep results: fair, adequate exam.  Sedation Meds: Patient assessed and found to be appropriate for moderate (conscious) sedation. Fentanyl 75 mcg. given IV. Versed 7 mg. given IV.  Findings - NORMAL EXAM: Ascending Colon to Descending Colon.  POLYP: Cecum, Maximum size: 8 mm. sessile polyp. Procedure:  snare with cautery, removed, retrieved, Polyp sent to pathology. ICD9: Neoplasia, Benign, Large Bowel: 211.3.  DIVERTICULOSIS: Sigmoid Colon. ICD9: Diverticulosis, Colon: 562.10. Comments: moderately-severe.  NORMAL EXAM: Rectum.   Assessment Abnormal examination, see findings above.  Diagnoses: 211.3: Neoplasia, Benign, Large Bowel.  562.10: Diverticulosis, Colon.   Comments: ONE POLYP AND  DIVERTICULOSIS Events  Unplanned Interventions: No intervention was required.  Plans  Post Exam Instructions: No aspirin or non-steroidal containing medications: 2 WEEKS.  Patient Education: Patient given standard instructions for: Polyps. Diverticulosis.  Disposition: After procedure patient sent to recovery. After recovery patient sent home.  Scheduling/Referral: Await pathology to schedule patient. Colonoscopy, to Iva Boop, MD, Macky Lower IN 3 YRS,  Path Letter, to The Patient,  Primary Care Zarian Colpitts, to Tillman Abide, MD, AS PLANNED,   CC:   Tillman Abide, MD  This report was created from the original endoscopy report, which was reviewed and signed by the above listed endoscopist.

## 2010-04-28 NOTE — Progress Notes (Signed)
  Phone Note Refill Request Message from:  [facility]  Refills Requested: Medication #1:  PAROXETINE HCL 20 MG TABS 1 by mouth daily  Method Requested: Fax to Local Pharmacy Initial call taken by: Wandra Mannan,  March 12, 2007 12:14 PM  Follow-up for Phone Call        okay to renew for 1 year but confirm dose--fax says 10mg , we have 20mg  listed Follow-up by: Cindee Salt MD,  March 12, 2007 1:10 PM  Additional Follow-up for Phone Call Additional follow up Details #1::        rx faxed Additional Follow-up by: Wandra Mannan,  March 12, 2007 1:30 PM

## 2010-04-28 NOTE — Procedures (Signed)
Summary: Colonoscopy   Colonoscopy  Procedure date:  10/29/2007  Findings:      Location:  [facility].    Procedures Next Due Date:    Colonoscopy: 10/2012  Patient Name: Jacob Perkins, Jacob Perkins MRN:  Procedure Procedures: Colonoscopy CPT: 16109.    with biopsy. CPT: Q5068410.    with polypectomy. CPT: A3573898.  Personnel: Endoscopist: Iva Boop, MD, [facility].  Exam Location: Exam performed in Outpatient Clinic. Outpatient  Patient Consent: Procedure, Alternatives, Risks and Benefits discussed, consent obtained, from patient. Consent was obtained by the RN.  Indications  Surveillance of: Adenomatous Polyp(s). This is an initial surveillance exam. Initial polypectomy was performed in 2006. in Jan. 1-2 Polyps were found at Index Exam. Largest polyp removed was 6 to 9 mm. Prior polyp located in proximal (splenic flexure and beyond) colon. Pathology of worst  polyp: tubular adenoma.  History  Current Medications: Patient is not currently taking Coumadin.  Pre-Exam Physical: Performed Oct 29, 2007. Cardio-pulmonary exam WNL. Rectal exam abnormal. HEENT exam , Abdominal exam, Mental status exam WNL. Abnormal PE findings include: Prostate slightly enlarged, no nodules (known BPH).  Comments: Pt. history reviewed/updated, physical exam performed prior to initiation of sedation? yes  Exam Exam: Extent of exam reached: Cecum, extent intended: Cecum.  The cecum was identified by appendiceal orifice and IC valve. Patient position: on left side. Time to Cecum: 00:02:31. Time for Withdrawl: 00:14:56. Colon retroflexion performed. Images taken. ASA Classification: II. Tolerance: excellent.  Monitoring: Pulse and BP monitoring, Oximetry used. Supplemental O2 given.  Colon Prep Used MoviPrep for colon prep. Prep results: excellent.  Sedation Meds: Patient assessed and found to be appropriate for moderate (conscious) sedation. Fentanyl 50 mcg. given IV. Versed 6 mg.  given IV.  Findings - MULTIPLE POLYPS: Splenic Flexure to Descending Colon. minimum size 2 mm, maximum size 4 mm. Procedure:  snare without cautery, removed, Polyp retrieved, 3 polyps Polyps sent to pathology. Comments: cold biopsy removal also.  - NORMAL EXAM: Cecum to Transverse Colon.  - DIVERTICULOSIS: Sigmoid Colon.  NORMAL EXAM: Rectum.   Assessment  Comments: 1) THREE (3) DIMINUTIVE COLON POLYPS REMOVED 2) SIGMOID DIVERTICULOSIS 3) OTHERWISE NORMAL POLYP SURVEILLANCE COLONOSCOPY, EXCELLENT PREP Events  Unplanned Interventions: No intervention was required.  Plans Patient Education: Patient given standard instructions for: Polyps. Diverticulosis.  Disposition: After procedure patient sent to recovery. After recovery patient sent home.  Scheduling/Referral: Await pathology to schedule patient. Colonoscopy, to Iva Boop, MD, [place], LIKELY 5 YRS,  Path Letter, to The Patient,    cc:  Tillman Abide, MD  REPORT OF SURGICAL PATHOLOGY   Case #: [id number] Patient Name: Jacob Perkins, Jacob Perkins  . Office Chart Number:  Lonell Grandchild [id number]   MRN: [id number] Pathologist: Alden Server A. Delila Spence, MD DOB/Age  September 02, 1950 (Age: 60) (Age: 60)    Gender: M Date Taken:  10/29/2007 Date Received: 10/30/2007   FINAL DIAGNOSIS   ***MICROSCOPIC EXAMINATION AND DIAGNOSIS***   COLON, SPLENIC FLEXURE, POLYPS:   -  ONE ADENOMATOUS POLYP, FRAGMENTS OF HYPERPLASTIC POLYP AND FRAGMENTS OF  BENIGN COLONIC MUCOSA. -  NO HIGH GRADE DYSPLASIA OR INVASIVE MALIGNANCY IDENTIFIED.   mw Date Reported:  10/31/2007     Alden Server A. Delila Spence, MD *** Electronically Signed Out By EAA ***   Clinical information HX adenomatous polyp.  R/O adenoma.  (cdc)   specimen(s) obtained Colon, polyp(s), splenic flexure   Gross Description Received in formalin are tan, soft tissue fragments that are submitted in toto.  Number:  multiple. Size: less than 0.1 cm  up to 0.3 cm  (SP:mw, 10/30/07)    mw/     Signed by Iva Boop MD  on 10/31/2007 at 5:36 PM  ________________________________________________________________________ 5 yr colon recall 11/2012   Signed by Iva Boop MD on 10/31/2007 at 5:36 PM   October 31, 2007 MRN: [id number]    [facility] [address] [place], [place]  [address]    Dear Mr. Piche,  The polyps removed from your colon were adenomatous. This means that they were pre-cancerous or that  they had the potential to change into cancer over time.  I recommend that you have a repeat colonoscopy in 5 years to determine if you have developed any new polyps over time. If you develop any new rectal bleeding, abdominal pain or significant bowel habit changes, please contact us before then.   Please call us if you are having persistent problems or have questions about your condition that have not been fully answered at this time.  Sincerely,  Iva Boop MD  This letter has been electronically signed by your physician.   Signed by Iva Boop MD on 10/31/2007 at 5:37 PM  ________________________________________________________________________ This report was created from the original endoscopy report, which was reviewed and signed by the above listed endoscopist.

## 2010-04-28 NOTE — Medication Information (Signed)
Summary: [facility] - PLAN RECOMMENDATIONS  [facility] - PLAN RECOMMENDATIONS   Imported By: Carin Primrose 10/26/2007 10:06:52  _____________________________________________________________________  External Attachment:    Type:   Image     Comment:   External Document

## 2010-04-28 NOTE — Assessment & Plan Note (Signed)
Summary: cpx/rbh   Vital Signs:  Patient Profile:   60 Years Old Male Height:     64.5 inches (163.83 cm) Weight:      216 pounds Temp:     98.9 degrees F oral Pulse rate:   72 / minute Pulse rhythm:   regular BP sitting:   132 / 78  (left arm) Cuff size:   regular  Vitals Entered By: Mervin Hack CMA (March 14, 2008 8:34 AM)               Vision Comments: 09/2008   PCP:  Tillman Abide MD  Chief Complaint:  30 minute checkup.  History of Present Illness: Doing well Satisfied with back repair Able to golf a little Has pension so needs to limit hours--about 20 hours per week  Tris to walk Weight is stable  checks sugars three times a day  He does adjust his insulin at times Slightly higher Still gets advise from [facility] the pharmacist    Current Allergies (reviewed today): No known allergies   Past Medical History:    Anxiety    Diabetes mellitus, type II--with nephropathy    GERD    Hypertension    Hyperlipidemia    Rosacea    Spinal stenosis/disc disease    Adenomatous polyp    Benign prostatic hypertrophy    Allergic rhinitis  Past Surgical History:    Reviewed history from 06/29/2007 and no changes required:        ~2001    Stress Test (-)       1920's   Septal repair                     Kidney stone x 1       2/09 L4-5 decompression and fusion Lovell Sheehan)   Family History:    Reviewed history from 09/01/2006 and no changes required:       Father: Alive at 78-- DM       Mother: Alive 39       Siblings: 2 brothers, 1 sister       HTN, depression and anxiety in family       ETOHism-- Oneal Grout       CVA:  strong in family  Social History:    Reviewed history from 09/11/2007 and no changes required:       Marital Status: Married       Children: 1 son       Retired--: Gaffer - [employer]       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  ENT      Complains of ringing in ears.      Denies decreased hearing.      occ tinnitus teeth okay--recent extraction  CV      Denies chest pain or discomfort, difficulty breathing at night, difficulty breathing while lying down, fainting, lightheadness, palpitations, and shortness of breath with exertion.  Resp      Denies cough and  shortness of breath.  GI      Denies bloody stools, change in bowel habits, dark tarry stools, indigestion, nausea, and vomiting.      had colonoscopy--1 adenomatous polyp  GU      Complains of decreased libido and erectile dysfunction.      Denies urinary frequency and urinary hesitancy.      urinary symptoms are better on doxazosin ED--he is okay with this  MS      Denies joint swelling.      Mild right shoulder stiffness in AM--loosens up quickly Some right knee pain---old skiing accident  Derm      Denies lesion(s).      fungal infection on chest--Dr Jarold Motto treating  Neuro      Denies headaches, numbness, tingling, and weakness.  Psych      Denies anxiety and depression.  Heme      Denies abnormal bruising and enlarge lymph nodes.  Allergy      Complains of seasonal allergies and sneezing.      okay with his current meds   Physical Exam  General:     alert and normal appearance.   Eyes:     pupils equal, pupils round, pupils reactive to light, and no optic disk abnormalities.   Ears:     R ear normal and L ear normal.   Mouth:     no erythema and no lesions.   Neck:     supple, no masses, no thyromegaly, no carotid bruits, and no cervical lymphadenopathy.   Lungs:     normal respiratory effort and normal breath sounds.   Heart:     normal rate, regular rhythm, no  murmur, and no gallop.   Abdomen:     soft, non-tender, and no masses.   Msk:     no joint tenderness and no joint swelling.   Pulses:     1+ in feet Extremities:     no edema Neurologic:     alert & oriented X3 and strength normal in all extremities.   Skin:     no suspicious lesions and no ulcerations.   Scattered benign nevi and small patch of red rash under left breast Axillary Nodes:     No palpable lymphadenopathy Psych:     normally interactive, good eye contact, not anxious appearing, and not depressed appearing.    Diabetes Management Exam:    Foot Exam (with socks and/or shoes not present):       Sensory-Pinprick/Light touch:          Left medial foot (L-4): normal          Left dorsal foot (L-5): normal          Left lateral foot (S-1): normal          Right medial foot (L-4): normal          Right dorsal foot (L-5): normal          Right lateral foot (S-1): normal       Inspection:          Left foot: normal          Right foot: normal       Nails:          Left foot: normal          Right foot: normal    Eye Exam:       Eye Exam done elsewhere  Date: 09/26/2007          Results: normal          Done by: Dr Genice Rouge    Impression & Recommendations:  Problem # 1:  PREVENTIVE HEALTH CARE (ICD-V70.0) Assessment: Comment Only due for PSA had colon  Problem # 2:  DIABETES MELLITUS, TYPE II, WITH RENAL COMPLICATIONS (ICD-250.40) Assessment: Unchanged good control no changes needed  His updated medication list for this problem includes:    Metformin Hcl 1000 Mg Tabs (Metformin hcl) .Marland Kitchen... 1 tablet by mouth twice a day    Altace 5 Mg Caps (Ramipril) .Marland Kitchen... 1 by mouth daily    Aspir-low 81 Mg Tbec (Aspirin) .Marland Kitchen... 1 by mouth daily    Lantus 100 Unit/ml Soln (Insulin glargine) .Marland Kitchen... 32 units daily    Glipizide Xl 10 Mg Tb24 (Glipizide) .Marland Kitchen... 1 by mouth daily  Labs Reviewed: HgBA1c: 6.3 (09/11/2007)   Creat: 0.7 (09/11/2007)   Microalbumin: 3.23  (03/08/2007)  Last Eye Exam: normal (09/26/2007)  Orders: TLB-Microalbumin/Creat Ratio, Urine (82043-MALB) TLB-A1C / Hgb A1C (Glycohemoglobin) (83036-A1C)   Problem # 3:  BENIGN PROSTATIC HYPERTROPHY (ICD-600.00) Assessment: Improved doing well with the doxazosin Orders: TLB-PSA (Prostate Specific Antigen) (84153-PSA)   Problem # 4:  HYPERTENSION (ICD-401.9) Assessment: Unchanged good control will check urine microal today  His updated medication list for this problem includes:    Altace 5 Mg Caps (Ramipril) .Marland Kitchen... 1 by mouth daily    Doxazosin Mesylate 4 Mg Tabs (Doxazosin mesylate) .Marland Kitchen... 1/2 nightly for 1 week then increase to 1 nightly for enlarged prostate  BP today: 132/78 Prior BP: 108/70 (10/19/2007)  Labs Reviewed: Creat: 0.7 (09/11/2007) Chol: 134 (09/11/2007)   HDL: 47.0 (09/11/2007)   LDL: 63 (09/11/2007)   TG: 122 (09/11/2007)  Orders: TLB-CBC Platelet - w/Differential (85025-CBCD) TLB-Renal Function Panel (80069-RENAL) TLB-TSH (Thyroid Stimulating Hormone) (84443-TSH) Venipuncture (40981)   Problem # 5:  SPINAL STENOSIS/DISC DISEASE (ICD-724.00) Assessment: Improved better since surgery  Complete Medication List: 1)  Metformin Hcl 1000 Mg Tabs (Metformin hcl) .Marland Kitchen.. 1 tablet by mouth twice a day 2)  Nexium 40 Mg Cpdr (Esomeprazole magnesium) .... Take one by mouth daily 3)  Rhinocort Aqua 32 Mcg/act Susp (Budesonide (nasal)) .... Use as directed 4)  Patanol 0.1 % Soln (Olopatadine hcl) .... Use one to two drops in eyes twice a day if needed for allergies 5)  Altace 5 Mg Caps (Ramipril) .Marland Kitchen.. 1 by mouth daily 6)  Aspir-low 81 Mg Tbec (Aspirin) .Marland Kitchen.. 1 by mouth daily 7)  Vitamin C 500 Mg Tabs (Ascorbic acid) .Marland Kitchen.. 1 by mouth daily 8)  Multivitamins Tabs (Multiple vitamin) .Marland Kitchen.. 1 by mouth daily 9)  Selenium Caps (Selenium caps) 10)  Zinc Tabs (Zinc tabs) 11)  Vitamin B-6 100 Mg Tabs (Pyridoxine hcl) 12)  Glucosamine-chondroitin Caps  (Glucosamine-chondroit-vit c-mn) 13)  Simvastatin 40 Mg Tabs (Simvastatin) .Marland Kitchen.. 1 by mouth daily 14)  Fish Oil 1000 Mg Caps (Omega-3 fatty acids) .Marland Kitchen.. 1 by mouth two times a day 15)  Lantus 100 Unit/ml Soln (Insulin glargine) .... 32 units daily 16)  Glipizide Xl 10 Mg Tb24 (Glipizide) .Marland Kitchen.. 1 by mouth daily 17)  Doxazosin Mesylate 4 Mg Tabs (Doxazosin mesylate) .... 1/2 nightly for 1 week then increase to 1 nightly for enlarged prostate 18)  Paxil 20 Mg Tabs (Paroxetine hcl) .... Take one by mouth once a day 19)  Bd U/f Short Pen Needle 31g X 8 Mm Misc (Insulin pen needle)  Other Orders: TLB-Lipid Panel (80061-LIPID) TLB-Hepatic/Liver Function  Pnl (80076-HEPATIC)   Patient Instructions: 1)  Please schedule a follow-up appointment in 6 months.   ] Current Allergies (reviewed today): No known allergies  Appended Document: Orders Update     Clinical Lists Changes  Orders: Added new Test order of T-General Health Panel (CBCD, CMP, TSH) ([address]) - Signed Added new Test order of T-Lipid Profile [phone]) - Signed Added new Test order of T- Hemoglobin A1C (82956-21308) - Signed Added new Test order of T-PSA  ([id number]) - Signed Added new Test order of T-Urine Microalbumin w/creat. ratio [phone] / [id number]) - Signed

## 2010-04-28 NOTE — Progress Notes (Signed)
Summary: PAXIL  Phone Note Refill Request Message from:  [facility] on June 19, 2008 1:17 PM  Refills Requested: Medication #1:  PAXIL 20 MG  TABS Take one by mouth once a day   Last Refilled: 05/20/2008 form on your desk   Method Requested: Fax to Local Pharmacy Initial call taken by: Mervin Hack CMA,  June 19, 2008 1:17 PM  Follow-up for Phone Call        Rx done electronically Follow-up by: Cindee Salt MD,  June 19, 2008 1:24 PM    New/Updated Medications: PAROXETINE HCL 20 MG TABS (PAROXETINE HCL) 1 daily   Prescriptions: PAROXETINE HCL 20 MG TABS (PAROXETINE HCL) 1 daily  #30 x 12   Entered and Authorized by:   Cindee Salt MD   Signed by:   Cindee Salt MD on 06/19/2008   Method used:   Electronically to        [facility]* (retail)       [address]       [place], [place]  [address]       Ph: [phone]       Fax: [phone]   RxID:   [id number]

## 2010-04-29 NOTE — Miscellaneous (Signed)
Summary: FLUTICASONE PROPIONATE  Medications Added FLUTICASONE PROPIONATE 50 MCG/ACT SUSP (FLUTICASONE PROPIONATE) use 2 sprays in each nostril once daily as needed       Clinical Lists Changes  Medications: Added new medication of FLUTICASONE PROPIONATE 50 MCG/ACT SUSP (FLUTICASONE PROPIONATE) use 2 sprays in each nostril once daily as needed - Signed Rx of FLUTICASONE PROPIONATE 50 MCG/ACT SUSP (FLUTICASONE PROPIONATE) use 2 sprays in each nostril once daily as needed;  #1 x 1;  Signed;  Entered by: Mervin Hack CMA (AAMA);  Authorized by: Cindee Salt MD;  Method used: Electronically to [facility]*, [address], [place], [place], [place]  [address], Ph: [phone], Fax: [phone]    Prescriptions: FLUTICASONE PROPIONATE 50 MCG/ACT SUSP (FLUTICASONE PROPIONATE) use 2 sprays in each nostril once daily as needed  #1 x 1   Entered by:   Mervin Hack CMA (AAMA)   Authorized by:   Cindee Salt MD   Signed by:   Mervin Hack CMA (AAMA) on 03/11/2010   Method used:   Electronically to        [facility]* (retail)       [address]       [place], [place]  [address]       Ph: [phone]       Fax: [phone]   RxID:   [id number]

## 2010-04-29 NOTE — Assessment & Plan Note (Signed)
Summary: CPX   Vital Signs:  Patient profile:   60 year old male Weight:      184 pounds BMI:     31.21 Temp:     99.1 degrees F oral Pulse rate:   60 / minute Pulse rhythm:   regular BP sitting:   110 / 80  (left arm) Cuff size:   large  Vitals Entered By: Mervin Hack CMA Duncan Dull) (March 08, 2010 2:49 PM) CC: adult physical   History of Present Illness: Doing fairly well  Back and disc problems are doing fairly well rarely takes the diazepam and hydrocodone tries to avoid going out on tractors as that makes it worse Not playing much golf Still going to gym Doing cardio and got personal trainer Still likes to snack at night  Off the insulin without a problem Checks sugars only occ---had 79 after gym yesterday Has had a few mild hypoglycemic spells at gym. Just takes something   Allergies: No Known Drug Allergies  Past History:  Past medical, surgical, family and social histories (including risk factors) reviewed for relevance to current acute and chronic problems.  Past Medical History: Reviewed history from 03/14/2008 and no changes required. Anxiety Diabetes mellitus, type II--with nephropathy GERD Hypertension Hyperlipidemia Rosacea Spinal stenosis/disc disease Adenomatous polyp Benign prostatic hypertrophy Allergic rhinitis  Past Surgical History: Reviewed history from 06/29/2007 and no changes required.  ~2001    Stress Test (-) 1920's   Septal repair               Kidney stone x 1 2/09 L4-5 decompression and fusion Lovell Sheehan)  Family History: Reviewed history from 09/01/2006 and no changes required. Father: Alive at 76-- DM Mother: Alive 30 Siblings: 2 brothers, 1 sister HTN, depression and anxiety in family ETOHism-- Oneal Grout CVA:  strong in family  Social History: Reviewed history from 03/18/2009 and no changes required. 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 Eyes:  Denies double vision and vision loss-1 eye. ENT:  Denies decreased hearing and ringing in ears; teeth okay--regular with dentist. CV:  Complains of lightheadness; denies chest pain or discomfort, difficulty breathing at night, difficulty breathing while lying down, fainting, palpitations, and shortness of breath with exertion; occ mild orthostatic dizziness--goes away in seconds. Resp:  Denies cough and shortness of breath. GI:  Complains of constipation and gas; denies abdominal pain, bloody stools, change in bowel habits, dark tarry stools, indigestion, loss of appetite, and nausea; does take some OTC laxatives as needed . GU:  Complains of decreased libido, erectile dysfunction, urinary frequency, and urinary hesitancy; denies nocturia; sex not important for marriage now occ mild urinary symtpoms. MS:  Complains of joint pain and low back pain; occ left hip, right knee pain--in addition to back. Derm:  Denies lesion(s) and rash. Neuro:  Complains of tingling; denies numbness and weakness; occ tingling in feet if on elliptical at times. Psych:  Complains of anxiety; denies depression; still gets waves of anxiety at times---no obvious precipitant uses the diazepam rarely for this. Heme:  Denies abnormal bruising and enlarge lymph nodes. Allergy:  Complains of seasonal  allergies and sneezing; using eye and nasal meds with good effect.  Physical Exam  General:  alert and normal appearance.   Eyes:  pupils equal, pupils round, pupils reactive to light, and no optic disk abnormalities.   Ears:  R ear normal and L ear normal.   Mouth:  no erythema, no exudates, and no  lesions.   Neck:  supple, no masses, no thyromegaly, and no cervical lymphadenopathy.   Lungs:  normal respiratory effort, no intercostal retractions, no accessory muscle use, and normal breath sounds.   Heart:  normal rate, regular rhythm, no murmur, and no gallop.   Abdomen:  soft, non-tender, and no masses.   Rectal:  no hemorrhoids and no masses.   Prostate:  no gland enlargement and no nodules.   Msk:  no joint tenderness and no joint swelling.   Pulses:  2+ in feet Extremities:  no edema Neurologic:  alert & oriented X3, strength normal in all extremities, and gait normal.   Skin:  no rashes, no suspicious lesions, and no ulcerations.   Axillary Nodes:  No palpable lymphadenopathy Psych:  normally interactive, good eye contact, not anxious appearing, and not depressed appearing.    Diabetes Management Exam:    Foot Exam (with socks and/or shoes not present):       Sensory-Pinprick/Light touch:          Left medial foot (L-4): normal          Left dorsal foot (L-5): normal          Left lateral foot (S-1): normal          Right medial foot (L-4): normal          Right dorsal foot (L-5): normal          Right lateral foot (S-1): normal       Inspection:          Left foot: normal          Right foot: normal       Nails:          Left foot: normal          Right foot: normal   Impression & Recommendations:  Problem # 1:  PREVENTIVE HEALTH CARE (ICD-V70.0) Assessment Comment Only  up to date with colon will check PSA due for pneumovax  Orders: TLB-PSA (Prostate Specific Antigen) (84153-PSA)  Problem # 2:  DIABETES MELLITUS, TYPE II, WITH RENAL COMPLICATIONS (ICD-250.40) Assessment: Improved  now off lantus will cut glipizide to 5mg  if still near normal  The following medications were removed from the medication list:    Lantus 100 Unit/ml Soln (Insulin glargine) .Marland Kitchen... 25  units daily as directed His updated medication list for this problem includes:    Metformin  Hcl 1000 Mg Tabs (Metformin hcl) .Marland Kitchen... 1 tablet by mouth twice a day    Altace 5 Mg Caps (Ramipril) .Marland Kitchen... 1 by mouth daily    Glipizide Xl 10 Mg Tb24 (Glipizide) .Marland Kitchen... 1 by mouth daily    Aspir-low 81 Mg Tbec (Aspirin) .Marland Kitchen... 1 by mouth daily  Labs Reviewed: Creat: 0.70 (03/18/2009)     Last Eye Exam: normal (09/02/2009) Reviewed HgBA1c results: 5.9 (09/16/2009)  6.2 (03/18/2009)  Orders: TLB-A1C / Hgb A1C (Glycohemoglobin) (83036-A1C) TLB-Microalbumin/Creat Ratio, Urine (82043-MALB)  Problem # 3:  HYPERTENSION (ICD-401.9) Assessment: Unchanged  good control no changes  His updated medication list for this problem includes:    Altace 5 Mg Caps (Ramipril) .Marland Kitchen... 1 by  mouth daily    Doxazosin Mesylate 4 Mg Tabs (Doxazosin mesylate) .Marland Kitchen... 1/2 nightly for 1 week then increase to 1 nightly for enlarged prostate  BP today: 110/80 Prior BP: 108/68 (09/16/2009)  Labs Reviewed: K+: 4.4 (03/18/2009) Creat: : 0.70 (03/18/2009)   Chol: 124 (03/18/2009)   HDL: 44 (03/18/2009)   LDL: 60 (03/18/2009)   TG: 101 (03/18/2009)  Orders: TLB-Renal Function Panel (80069-RENAL) TLB-CBC Platelet - w/Differential (85025-CBCD) TLB-TSH (Thyroid Stimulating Hormone) (84443-TSH)  Problem # 4:  DYSLIPIDEMIA (ICD-272.4) Assessment: Unchanged  doing fine may be able to cut dose  His updated medication list for this problem includes:    Simvastatin 40 Mg Tabs (Simvastatin) .Marland Kitchen... 1 by mouth daily  Labs Reviewed: SGOT: 23 (03/18/2009)   SGPT: 28 (03/18/2009)   HDL:44 (03/18/2009), 37 (03/14/2008)  LDL:60 (03/18/2009), 67 (03/14/2008)  Chol:124 (03/18/2009), 125 (03/14/2008)  Trig:101 (03/18/2009), 103 (03/14/2008)  Orders: TLB-Lipid Panel (80061-LIPID) TLB-Hepatic/Liver Function Pnl (80076-HEPATIC) Venipuncture (36644)  Problem # 5:  ANXIETY (ICD-300.00) Assessment: Unchanged mild symtpoms still  continue meds  His updated medication list for this problem includes:    Diazepam 5 Mg Tabs  (Diazepam) .Marland Kitchen... As needed for back spasms    Paroxetine Hcl 20 Mg Tabs (Paroxetine hcl) .Marland Kitchen... 1 daily  Problem # 6:  BENIGN PROSTATIC HYPERTROPHY (ICD-600.00) Assessment: Unchanged voiding okay on doxazosin  Problem # 7:  SPINAL STENOSIS/DISC DISEASE (ICD-724.00) Assessment: Improved less pain if he is careful  Complete Medication List: 1)  Hydrocodone-acetaminophen 10-500 Mg Tabs (Hydrocodone-acetaminophen) .... Take 1 by mouth every 5 hours as needed for pain 2)  Diazepam 5 Mg Tabs (Diazepam) .... As needed for back spasms 3)  Metformin Hcl 1000 Mg Tabs (Metformin hcl) .Marland Kitchen.. 1 tablet by mouth twice a day 4)  Nexium 40 Mg Cpdr (Esomeprazole magnesium) .... Take one by mouth daily 5)  Altace 5 Mg Caps (Ramipril) .Marland Kitchen.. 1 by mouth daily 6)  Simvastatin 40 Mg Tabs (Simvastatin) .Marland Kitchen.. 1 by mouth daily 7)  Glipizide Xl 10 Mg Tb24 (Glipizide) .Marland Kitchen.. 1 by mouth daily 8)  Doxazosin Mesylate 4 Mg Tabs (Doxazosin mesylate) .... 1/2 nightly for 1 week then increase to 1 nightly for enlarged prostate 9)  Paroxetine Hcl 20 Mg Tabs (Paroxetine hcl) .Marland Kitchen.. 1 daily 10)  Patanol 0.1 % Soln (Olopatadine hcl) .... Use one to two drops in eyes twice a day if needed for allergies 11)  Glucosamine-chondroitin Caps (Glucosamine-chondroit-vit c-mn) 12)  Aspir-low 81 Mg Tbec (Aspirin) .Marland Kitchen.. 1 by mouth daily 13)  Vitamin C 500 Mg Tabs (Ascorbic acid) .Marland Kitchen.. 1 by mouth daily 14)  Multivitamins Tabs (Multiple vitamin) .Marland Kitchen.. 1 by mouth daily 15)  Selenium Caps (Selenium caps) 16)  Zinc Tabs (Zinc tabs) 17)  Vitamin B-6 100 Mg Tabs (Pyridoxine hcl) 18)  Fish Oil 1000 Mg Caps (Omega-3 fatty acids) .Marland Kitchen.. 1 by mouth two times a day  Other Orders: Pneumococcal Vaccine (03474) Admin 1st Vaccine (25956)  Patient Instructions: 1)  Please schedule a follow-up appointment in 6 months .  Prescriptions: DIAZEPAM 5 MG TABS (DIAZEPAM) as needed for back spasms  #60 x 0   Entered and Authorized by:   Cindee Salt MD   Signed  by:   Cindee Salt MD on 03/08/2010   Method used:   Print then Give to Patient   RxID:   [id number]    Orders Added: 1)  Est. Patient 40-64 years [99396] 2)  TLB-A1C / Hgb A1C (Glycohemoglobin) [83036-A1C] 3)  TLB-Microalbumin/Creat Ratio, Urine [82043-MALB] 4)  TLB-PSA (Prostate Specific Antigen) [84153-PSA] 5)  TLB-Lipid Panel [80061-LIPID] 6)  TLB-Hepatic/Liver Function Pnl [80076-HEPATIC] 7)  Venipuncture [36415] 8)  TLB-Renal Function Panel [80069-RENAL] 9)  TLB-CBC Platelet - w/Differential [85025-CBCD] 10)  TLB-TSH (Thyroid Stimulating Hormone) [84443-TSH] 11)  Pneumococcal Vaccine [90732] 12)  Admin 1st Vaccine [16109]   Immunizations Administered:  Pneumonia Vaccine:    Vaccine Type: Pneumovax    Site: left deltoid    Mfr: Merck    Dose: 0.5 ml    Route: IM    Given by: Mervin Hack CMA (AAMA)    Exp. Date: 08/22/2011    Lot #: [id number]    VIS given: 03/02/09 version given March 08, 2010.   Immunizations Administered:  Pneumonia Vaccine:    Vaccine Type: Pneumovax    Site: left deltoid    Mfr: Merck    Dose: 0.5 ml    Route: IM    Given by: Mervin Hack CMA (AAMA)    Exp. Date: 08/22/2011    Lot #: [id number]    VIS given: 03/02/09 version given March 08, 2010.  Current Allergies (reviewed today): No known allergies    Influenza Immunization History:    Influenza # 1:  Historical (12/26/2009)

## 2010-07-09 ENCOUNTER — Other Ambulatory Visit: Payer: Self-pay | Admitting: Internal Medicine

## 2010-08-02 ENCOUNTER — Other Ambulatory Visit: Payer: Self-pay | Admitting: Internal Medicine

## 2010-08-10 NOTE — Op Note (Signed)
NAME:  Jacob Perkins, Jacob Perkins             ACCOUNT NO.:  [id number]   MEDICAL RECORD NO.:  [id number]          PATIENT TYPE:  INP   LOCATION:  3028                         FACILITY:  [facility]   PHYSICIAN:  Cristi Loron, M.D.DATE OF BIRTH:  March 20, 1951   DATE OF PROCEDURE:  05/10/2007  DATE OF DISCHARGE:                               OPERATIVE REPORT   BRIEF HISTORY:  The patient is a 60 year old white male who has suffered  from back and leg pain consistent with neurogenic claudication.  He  failed medical management and was worked up with a lumbar MRI, which  demonstrated the patient had severe multifactorial spinal stenosis and  degenerative changes at L4-L5.  I discussed various treatment options  with the patient and his wife including surgery.  They have weighed the  risks, benefits, and alternatives of surgery and decided to proceed with  a L4-L5 decompression and fusion/instrumentation.   PREOPERATIVE DIAGNOSES:  1. L4-L5 severe multifactorial spinal stenosis.  2. Disk degeneration.  3. Lumbar radiculopathy/myelopathy.  4. Lumbago.  5. Facet arthropathy.   POSTOPERATIVE DIAGNOSES:  1. L4-L5 severe multifactorial spinal stenosis.  2. Disk degeneration.  3. Lumbar radiculopathy/myelopathy.  4. Lumbago.  5. Facet arthropathy.   PROCEDURE:  Bilateral L4 laminotomies and foraminotomies to decompress  the bilateral L4 and L5 nerve roots; L4-L5 transforaminal lumbar  interbody fusion with local autograft bone and Actifuse bone graft  extender; insertion of L4-L5 interbody prosthesis (Capstone PEEK  interbody prosthesis); L4-L5 posterior nonsegmental instrumentation with  Legacy titanium pedicle screws and rods; L4-L5 posterolateral  arthrodesis with local autograft bone and Actifuse bone graft extender.   SURGEON:  Cristi Loron, M.D.   ASSISTANT:  Clydene Fake, M.D.   ANESTHESIA:  General endotracheal.   ESTIMATED BLOOD LOSS:  200 cc.   SPECIMENS:  None.   DRAINS:  None.   COMPLICATIONS:  None.   DESCRIPTION OF PROCEDURE:  The patient was brought to the operating room  by the anesthesia team.  General endotracheal anesthesia was induced.  The patient was then turned to the prone position on the Wilson frame.  His lumbosacral region was then shaved with clippers and prepared with  Betadine scrub and Betadine solution.  Sterile drapes were applied and  then injected the area to be incised with Marcaine with epinephrine  solution and used scalpel to make a linear midline incision over the L4-  L5 interspace.  I used electrocautery to perform a bilateral  subperiosteal dissection exposing the spinous process and lamina of L4  and L5.  We obtained the intraoperative radiograph to confirm our  location.   We then inserted the Versa-Trac retractor for exposure .  We began the  decompression by performing bilateral L4 laminotomies.  I widened the  laminotomies with Kerrison punch and removed the L4-L5 ligamentum  flavum.  We then performed foraminotomies, but we encountered severe  stenosis and I performed a foraminotomy of the bilateral L4-L5 nerve  roots completing the decompression at this level.  Of note, the  decompression needed because of severe stenosis was much greater than  required to do a simple  TLIF because of this severe stenosis.   We now turned our attention to arthrodesis.  I used a high-speed drill  and the Leksell rongeurs to remove the inferior facet at L4 on the right  by getting wider exposure to the right L4-L5 intervertebral disk.  I  then incised the intervertebral disk with a 15-blade scalpel and  performed a partial diskectomy using the pituitary forceps.  We then  prepared the vertebral endplates for fusion by removing the soft tissue  using the curettes.  We used trial spacers and determined to  use a 10 x  26 mm PEEK interbody prosthesis.  We then prefilled the prosthesis with  local autograft bone we obtained  during decompression as well as  Actifuse bone graft extender, and we then inserted the prosthesis into  the L4-L5 interspace from the right, of course after retracting the  neural structures out of harms way.  We then used a tamp to turn the  prosthesis laterally and place this more anteriorly in the disk space.  We then filled posteriorly into the disk space with a combination of  Actifuse and local autograft bone completing the transforaminal lumbar  interbody fusion.   We now turned our attention to the instrumentation.  Under fluoroscopic  guidance, we cannulated bilateral L4 and L5 pedicles with a bone probe.  We tapped the pedicles with 5.5-mm tap and probed inside the tap.  Pedicles were without cortical breaches and then inserted a 6.5 x 55 mm  pedicle screws bilaterally at L4 and L5.  We then palpated along the  medial aspect of the L4 and L5 pedicles and noted there was no cortical  breeches and the nerve roots were uninjured.  We then connected  unilateral pedicle screws with a lordotic rod.  We compressed the  construct and secured the rods in place with the caps which were  tightened appropriately completing the instrumentation.   We now turned our attention to the posterolateral arthrodesis.  We used  the high-speed drill to decorticate the remainder of the left L4-L5  facet, pars and transverse processes.  We then laid a combination of  Actifuse bone graft extender as well as local autograft bone over these  decorticated posterolateral structures completing the posterolateral  arthrodesis.   We then inspected the thecal sac and the bilateral L4 and L5 nerve roots  and noted they were well decompressed.  We obtained hemostasis using  bipolar cautery, irrigated the wound out with bacitracin solution, and  then removed the retractor.  We then re-approximated the patient's  thoracolumbar fascia with interrupted #1 Vicryl suture, subcutaneous  tissue with interrupted 2-0  Vicryl suture, and skin with Steri-Strips  and benzoin.  The wound was then coated with bacitracin ointment.  A  sterile dressing was applied.  The drapes were removed.  The patient was  subsequently returned to supine position where he was extubated by the  anesthesia team and transported to the post-anesthesia care unit in  stable condition.  All sponge, instrument, and needle counts were  correct at the end of this case.      Cristi Loron, M.D.  Electronically Signed     JDJ/MEDQ  D:  05/10/2007  T:  05/11/2007  Job:  [id number]

## 2010-08-10 NOTE — Discharge Summary (Signed)
NAME:  Jacob Perkins, Jacob Perkins             ACCOUNT NO.:  [id number]   MEDICAL RECORD NO.:  [id number]          PATIENT TYPE:  INP   LOCATION:  3028                         FACILITY:  [facility]   PHYSICIAN:  Payton Doughty, M.D.      DATE OF BIRTH:  1951-01-04   DATE OF ADMISSION:  05/10/2007  DATE OF DISCHARGE:  05/13/2007                               DISCHARGE SUMMARY   ADMITTING DIAGNOSIS:  Lumbar spondylosis L4-5.   DISCHARGE DIAGNOSIS:  Lumbar spondylosis L4-5.   PROCEDURE:  L4-5 fusion.   COMPLICATIONS:  None.   DISCHARGE STATUS:  Feeling well.   A 60 year old gentleman whose history and physical is recounted in the  chart.  He has back and leg pain.  He also has diabetes.  Exam is  intact.  He was admitted after ascertaining normal laboratory values and  underwent a fusion at L4-5 by Dr. Lovell Sheehan.  Postoperatively, he has done  well.  His sugars have remained within the acceptable range.  He has no  drainage from his incision.  His strength is full.  His incision is dry  and well healing.  He is going to be discharged home to the care of his  family with intact strength.   FOLLOWUP:  In the [facility] Office via phone call for a visit with Dr.  Lovell Sheehan.           ______________________________  Payton Doughty, M.D.     MWR/MEDQ  D:  05/13/2007  T:  05/14/2007  Job:  [id number]

## 2010-08-13 ENCOUNTER — Other Ambulatory Visit: Payer: Self-pay | Admitting: Internal Medicine

## 2010-09-06 ENCOUNTER — Encounter: Payer: Self-pay | Admitting: Internal Medicine

## 2010-09-07 ENCOUNTER — Ambulatory Visit (INDEPENDENT_AMBULATORY_CARE_PROVIDER_SITE_OTHER): Payer: PRIVATE HEALTH INSURANCE | Admitting: Internal Medicine

## 2010-09-07 ENCOUNTER — Encounter: Payer: Self-pay | Admitting: Internal Medicine

## 2010-09-07 VITALS — BP 110/60 | HR 72 | Temp 99.1°F | Ht 64.5 in | Wt 186.0 lb

## 2010-09-07 DIAGNOSIS — E1129 Type 2 diabetes mellitus with other diabetic kidney complication: Secondary | ICD-10-CM

## 2010-09-07 DIAGNOSIS — E785 Hyperlipidemia, unspecified: Secondary | ICD-10-CM

## 2010-09-07 DIAGNOSIS — N400 Enlarged prostate without lower urinary tract symptoms: Secondary | ICD-10-CM

## 2010-09-07 DIAGNOSIS — I10 Essential (primary) hypertension: Secondary | ICD-10-CM

## 2010-09-07 LAB — HEMOGLOBIN A1C: Hgb A1c MFr Bld: 7.7 % — ABNORMAL HIGH (ref 4.6–6.5)

## 2010-09-07 NOTE — Assessment & Plan Note (Signed)
BP Readings from Last 3 Encounters:  09/07/10 110/60  03/08/10 110/80  09/16/09 108/68   Good control No changes needed

## 2010-09-07 NOTE — Assessment & Plan Note (Signed)
Ongoing symptoms but no changes needed

## 2010-09-07 NOTE — Assessment & Plan Note (Signed)
Lab Results  Component Value Date   LDLCALC 71 03/08/2010   Good control No changes needed

## 2010-09-07 NOTE — Progress Notes (Signed)
Subjective:    Patient ID: Jacob Perkins, male    DOB: 1950-09-11, 60 y.o.   MRN: [id number]  HPI Doing okay Just back from [place]---visiting son and DIL Has been not quite as good about his diet--thought he gained weight but fairly stable  Has been working more at the golf course Not going to the gym quite as often--- 2-3 per week Uses elliptical and weights. Done with personal trainer  Checks sugars a couple of times per week Fasting 100-145 No hypoglycemic reactions of note---did have one mild reaction in gym only Still monitors calories in computer program  No chest pain No SOB No edema  Still on PPI This controls symptoms --only occ twinge if he is stressed,etc Still tries to avoid changed Does have some anxiety at times  Current Outpatient Prescriptions on File Prior to Visit  Medication Sig Dispense Refill  . aspirin 81 MG tablet Take 81 mg by mouth daily.        . diazepam (VALIUM) 5 MG tablet Take 5 mg by mouth as needed.        . doxazosin (CARDURA) 4 MG tablet TAKE 1/2 TABLET NIGHTLY FOR 1 WEEK, THENINCREASE TO 1 TABLET NIGHTLY FOR        ENLARGED PROSTATE  30 tablet  2  . esomeprazole (NEXIUM) 40 MG capsule Take 40 mg by mouth daily before breakfast.        . fish oil-omega-3 fatty acids 1000 MG capsule Take 1 g by mouth 2 (two) times daily.        . fluticasone (FLONASE) 50 MCG/ACT nasal spray USE 2 SPRAYS IN EACH NOSTRIL            EVERY DAY  16 g  1  . glipiZIDE (GLUCOTROL) 5 MG 24 hr tablet Take 5 mg by mouth daily.        Marland Kitchen glucosamine-chondroitin 500-400 MG tablet Take 1 tablet by mouth daily.        Marland Kitchen HYDROcodone-acetaminophen (LORTAB) 10-500 MG per tablet Take 1 tablet by mouth every 6 (six) hours as needed.        . metFORMIN (GLUCOPHAGE) 1000 MG tablet Take 1,000 mg by mouth 2 (two) times daily with a meal.        . Multiple Vitamin (MULTIVITAMIN) capsule Take 1 capsule by mouth daily.        Marland Kitchen PARoxetine (PAXIL) 20 MG tablet Take 20 mg by mouth  every morning.        Marland Kitchen PATANOL 0.1 % ophthalmic solution USE ONE TO TWO DROPS IN EYES TWICE A DAYIF NEEDED FOR ALLERGIES  5 mL  1  . pyridOXINE (VITAMIN B-6) 100 MG tablet Take 100 mg by mouth daily.        . ramipril (ALTACE) 5 MG capsule Take 5 mg by mouth daily.        . Selenium 100 MCG CAPS Take by mouth as needed.        . simvastatin (ZOCOR) 40 MG tablet Take 40 mg by mouth at bedtime.        . vitamin C (ASCORBIC ACID) 500 MG tablet Take 500 mg by mouth daily.        Marland Kitchen zinc gluconate 50 MG tablet Take 50 mg by mouth daily.         Past Medical History  Diagnosis Date  . Anxiety   . Diabetes mellitus   . Hypertension   . Hyperlipidemia   . Rosacea   .  Spinal stenosis     disc disease  . Adenomatous polyp   . BPH (benign prostatic hypertrophy)   . Allergy     Past Surgical History  Procedure Date  . Asd repair   . Spinal cord decompression     Family History  Problem Relation Age of Onset  . Diabetes Father   . Alcohol abuse Paternal Aunt     History   Social History  . Marital Status: Married    Spouse Name: N/A    Number of Children: 1  . Years of Education: N/A   Occupational History  . retired- [employer]- [employer]   . now works at Systems analyst course    Social History Main Topics  . Smoking status: Former Smoker    Types: Cigars    Quit date: 04/29/2007  . Smokeless tobacco: Not on file  . Alcohol Use: Yes     occasional  . Drug Use: No  . Sexually Active: Not on file   Other Topics Concern  . 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 Naps when needed Still notes urinary freq at times    Objective:   Physical Exam  Constitutional: He appears well-developed and well-nourished. No distress.    Neck: Normal range of motion. Neck supple. No thyromegaly present.  Cardiovascular: Normal rate, regular rhythm, normal heart sounds and intact distal pulses.  Exam reveals no gallop.   No murmur heard. Pulmonary/Chest: Effort normal and breath sounds normal. No respiratory distress. He has no wheezes. He has no rales.  Musculoskeletal: Normal range of motion. He exhibits no edema and no tenderness.  Lymphadenopathy:    He has no cervical adenopathy.  Psychiatric: He has a normal mood and affect. His behavior is normal. Judgment and thought content normal.          Assessment & Plan:

## 2010-09-07 NOTE — Assessment & Plan Note (Signed)
Seems to still have reasonable control Off insulin and decreased glipizide Will recheck the A1c

## 2010-11-10 ENCOUNTER — Telehealth: Payer: Self-pay | Admitting: *Deleted

## 2010-11-10 NOTE — Telephone Encounter (Signed)
[facility] pharmacist has faxed info regarding pt's blood glucose readings and diabetic management.  Fax is on your desk.

## 2010-11-12 MED ORDER — INSULIN GLARGINE 100 UNIT/ML ~~LOC~~ SOLN: 10.0000 [IU] | Freq: Every day | SUBCUTANEOUS | Status: DC

## 2010-11-12 NOTE — Telephone Encounter (Signed)
Discussed with patient He is fine with restarting lantus solostar Annice Pih will help him with titration but I discussed it briefly with him as well

## 2010-11-18 ENCOUNTER — Other Ambulatory Visit: Payer: Self-pay | Admitting: Internal Medicine

## 2010-12-01 ENCOUNTER — Other Ambulatory Visit: Payer: Self-pay | Admitting: Internal Medicine

## 2010-12-10 ENCOUNTER — Other Ambulatory Visit: Payer: Self-pay | Admitting: Internal Medicine

## 2010-12-15 ENCOUNTER — Other Ambulatory Visit: Payer: Self-pay | Admitting: Internal Medicine

## 2010-12-17 LAB — BASIC METABOLIC PANEL
BUN: 10
CO2: 28
Calcium: 9.3
Chloride: 106
Creatinine, Ser: 0.61
GFR calc Af Amer: >60
GFR calc non Af Amer: >60
Glucose, Bld: 111 — ABNORMAL HIGH
Potassium: 4.4
Sodium: 142

## 2010-12-17 LAB — TYPE AND SCREEN
ABO/RH(D): AB POS
Antibody Screen: NEGATIVE

## 2010-12-17 LAB — CBC
HCT: 43.5
Hemoglobin: 14.8
MCHC: 34
MCV: 91.4
Platelets: 218
RBC: 4.76
RDW: 13.7
WBC: 9.6

## 2010-12-17 LAB — ABO/RH: ABO/RH(D): AB POS

## 2010-12-24 LAB — GLUCOSE, CAPILLARY
Glucose-Capillary: 139 — ABNORMAL HIGH
Glucose-Capillary: 165 — ABNORMAL HIGH

## 2010-12-31 ENCOUNTER — Other Ambulatory Visit: Payer: Self-pay | Admitting: Internal Medicine

## 2010-12-31 NOTE — Telephone Encounter (Signed)
[facility] patient, ok to fill?

## 2010-12-31 NOTE — Telephone Encounter (Signed)
Ok thanks 

## 2010-12-31 NOTE — Telephone Encounter (Signed)
rx called into pharmacy

## 2011-01-04 ENCOUNTER — Other Ambulatory Visit: Payer: Self-pay | Admitting: Internal Medicine

## 2011-01-13 ENCOUNTER — Other Ambulatory Visit: Payer: Self-pay | Admitting: Internal Medicine

## 2011-02-08 ENCOUNTER — Other Ambulatory Visit: Payer: Self-pay | Admitting: *Deleted

## 2011-02-08 MED ORDER — SIMVASTATIN 40 MG PO TABS: 40.0000 mg | ORAL_TABLET | Freq: Every day | ORAL | Status: DC

## 2011-02-11 ENCOUNTER — Other Ambulatory Visit: Payer: Self-pay | Admitting: Internal Medicine

## 2011-03-03 ENCOUNTER — Other Ambulatory Visit: Payer: Self-pay | Admitting: Internal Medicine

## 2011-03-10 ENCOUNTER — Other Ambulatory Visit: Payer: Self-pay | Admitting: Internal Medicine

## 2011-03-14 ENCOUNTER — Encounter: Payer: PRIVATE HEALTH INSURANCE | Admitting: Internal Medicine

## 2011-03-18 ENCOUNTER — Other Ambulatory Visit: Payer: Self-pay | Admitting: Internal Medicine

## 2011-03-28 ENCOUNTER — Other Ambulatory Visit: Payer: Self-pay | Admitting: Internal Medicine

## 2011-04-14 ENCOUNTER — Other Ambulatory Visit: Payer: Self-pay | Admitting: Internal Medicine

## 2011-04-18 ENCOUNTER — Ambulatory Visit (INDEPENDENT_AMBULATORY_CARE_PROVIDER_SITE_OTHER): Payer: PRIVATE HEALTH INSURANCE | Admitting: Internal Medicine

## 2011-04-18 ENCOUNTER — Encounter: Payer: Self-pay | Admitting: Internal Medicine

## 2011-04-18 VITALS — BP 120/70 | HR 61 | Temp 98.1°F | Ht 64.0 in | Wt 201.0 lb

## 2011-04-18 DIAGNOSIS — E1129 Type 2 diabetes mellitus with other diabetic kidney complication: Secondary | ICD-10-CM

## 2011-04-18 DIAGNOSIS — E785 Hyperlipidemia, unspecified: Secondary | ICD-10-CM

## 2011-04-18 DIAGNOSIS — N400 Enlarged prostate without lower urinary tract symptoms: Secondary | ICD-10-CM

## 2011-04-18 DIAGNOSIS — F411 Generalized anxiety disorder: Secondary | ICD-10-CM

## 2011-04-18 DIAGNOSIS — I10 Essential (primary) hypertension: Secondary | ICD-10-CM

## 2011-04-18 DIAGNOSIS — E669 Obesity, unspecified: Secondary | ICD-10-CM

## 2011-04-18 DIAGNOSIS — Z0000 Encounter for general adult medical examination without abnormal findings: Secondary | ICD-10-CM

## 2011-04-18 LAB — BASIC METABOLIC PANEL
BUN: 15 mg/dL (ref 6–23)
CO2: 32 mEq/L (ref 19–32)
Calcium: 9.3 mg/dL (ref 8.4–10.5)
Chloride: 100 mEq/L (ref 96–112)
Creatinine, Ser: 0.6 mg/dL (ref 0.4–1.5)
GFR: 140.71 mL/min (ref >60.00)
Glucose, Bld: 80 mg/dL (ref 70–99)
Potassium: 4.5 mEq/L (ref 3.5–5.1)
Sodium: 141 mEq/L (ref 135–145)

## 2011-04-18 LAB — CBC WITH DIFFERENTIAL/PLATELET
Basophils Absolute: 0.1 10*3/uL (ref 0.0–0.1)
Basophils Relative: 0.7 % (ref 0.0–3.0)
Eosinophils Absolute: 0.8 10*3/uL — ABNORMAL HIGH (ref 0.0–0.7)
Eosinophils Relative: 9.9 % — ABNORMAL HIGH (ref 0.0–5.0)
HCT: 43.1 % (ref 39.0–52.0)
Hemoglobin: 14.6 g/dL (ref 13.0–17.0)
Lymphocytes Relative: 31.4 % (ref 12.0–46.0)
Lymphs Abs: 2.4 10*3/uL (ref 0.7–4.0)
MCHC: 34 g/dL (ref 30.0–36.0)
MCV: 91.8 fl (ref 78.0–100.0)
Monocytes Absolute: 0.8 10*3/uL (ref 0.1–1.0)
Monocytes Relative: 10 % (ref 3.0–12.0)
Neutro Abs: 3.7 10*3/uL (ref 1.4–7.7)
Neutrophils Relative %: 48 % (ref 43.0–77.0)
Platelets: 202 10*3/uL (ref 150.0–400.0)
RBC: 4.69 Mil/uL (ref 4.22–5.81)
RDW: 13.3 % (ref 11.5–14.6)
WBC: 7.8 10*3/uL (ref 4.5–10.5)

## 2011-04-18 LAB — LIPID PANEL
Cholesterol: 137 mg/dL (ref 0–200)
HDL: 51.3 mg/dL (ref >39.00)
LDL Cholesterol: 71 mg/dL (ref 0–99)
Total CHOL/HDL Ratio: 3
Triglycerides: 76 mg/dL (ref 0.0–149.0)
VLDL: 15.2 mg/dL (ref 0.0–40.0)

## 2011-04-18 LAB — HEPATIC FUNCTION PANEL
ALT: 30 U/L (ref 0–53)
AST: 24 U/L (ref 0–37)
Albumin: 4.3 g/dL (ref 3.5–5.2)
Alkaline Phosphatase: 33 U/L — ABNORMAL LOW (ref 39–117)
Bilirubin, Direct: 0.1 mg/dL (ref 0.0–0.3)
Total Bilirubin: 0.8 mg/dL (ref 0.3–1.2)
Total Protein: 6.6 g/dL (ref 6.0–8.3)

## 2011-04-18 LAB — PSA: PSA: 1.48 ng/mL (ref 0.10–4.00)

## 2011-04-18 LAB — MICROALBUMIN / CREATININE URINE RATIO
Creatinine,U: 150.9 mg/dL
Microalb Creat Ratio: 1.5 mg/g (ref 0.0–30.0)
Microalb, Ur: 2.3 mg/dL — ABNORMAL HIGH (ref 0.0–1.9)

## 2011-04-18 LAB — HEMOGLOBIN A1C: Hgb A1c MFr Bld: 6.8 % — ABNORMAL HIGH (ref 4.6–6.5)

## 2011-04-18 LAB — TSH: TSH: 3.13 u[IU]/mL (ref 0.35–5.50)

## 2011-04-18 NOTE — Assessment & Plan Note (Signed)
Discussed changing up work outs Needs to buckle down on proper eating

## 2011-04-18 NOTE — Assessment & Plan Note (Signed)
Generally healthy Discussed lifestyle Will check PSA

## 2011-04-18 NOTE — Progress Notes (Signed)
Subjective:    Patient ID: Jacob Perkins, male    DOB: 06/28/1950, 61 y.o.   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 aspirin 81 MG tablet Take 81 mg by mouth daily.        . B-D ULTRAFINE III SHORT PEN 31G X 8 MM MISC USE AS DIRECTED  100 each  0  . diazepam (VALIUM) 5 MG tablet TAKE ONE TABLET ONCE OR TWICE A DAY     AS NEEDED FOR BACK SPASMS  60 tablet  0  . doxazosin (CARDURA) 4 MG tablet TAKE 1/2 TABLET NIGHTLY FOR 1 WEEK, THENINCREASE TO 1 TABLET NIGHTLY FOR        ENLARGED PROSTATE  30 tablet  11  . esomeprazole (NEXIUM) 40 MG capsule Take 40 mg by mouth daily before breakfast.        . fish oil-omega-3 fatty acids 1000 MG capsule Take 1 g by mouth 2 (two) times daily.        . fluticasone (FLONASE) 50 MCG/ACT nasal spray Place 2 sprays into the nose daily.  16 g  0  . glipiZIDE (GLUCOTROL XL) 5 MG 24 hr tablet TAKE 1 TABLET EVERY DAY  30 tablet  11  . glucosamine-chondroitin 500-400 MG tablet Take 1 tablet by mouth daily.        Marland Kitchen HYDROcodone-acetaminophen (LORTAB) 10-500 MG per tablet Take 1 tablet by mouth every 6 (six) hours as needed.        . metFORMIN (GLUCOPHAGE) 1000 MG tablet TAKE ONE TABLET TWICE A DAY  60 tablet  11  . Multiple Vitamin (MULTIVITAMIN) capsule Take 1 capsule by mouth daily.        . ONE TOUCH ULTRA TEST test strip USE AS DIRECTED  100 each  1  . PARoxetine (PAXIL) 20 MG tablet TAKE 1 TABLET EVERY DAY  30 tablet  0  . pyridOXINE (VITAMIN B-6) 100 MG tablet Take 100 mg by mouth daily.        . ramipril (ALTACE) 5 MG  capsule TAKE ONE CAPSULE DAILY  30 capsule  0  . Selenium 100 MCG CAPS Take by mouth as needed.        . simvastatin (ZOCOR) 40 MG tablet TAKE 1 TABLET EVERY DAY  30 tablet  11  . vitamin C (ASCORBIC ACID) 500 MG tablet Take 500 mg by mouth daily.        Marland Kitchen zinc gluconate 50 MG tablet Take 50 mg by mouth daily.          No Known Allergies  Past Medical History  Diagnosis Date  . Anxiety   . Diabetes mellitus   . Hypertension   . Hyperlipidemia   . Rosacea   . Spinal stenosis     disc disease  . Adenomatous polyp   . BPH (benign prostatic hypertrophy)   . Allergy     Past Surgical History  Procedure Date  . Asd repair   . Spinal cord decompression     Family History  Problem Relation Age  of Onset  . Diabetes Father   . Alcohol abuse Paternal Aunt     History   Social History  . Marital Status: Married    Spouse Name: N/A    Number of Children: 1  . Years of Education: N/A   Occupational History  . retired- [employer]   . now works at Systems analyst course    Social History Main Topics  . Smoking status: Former Smoker    Types: Cigars    Quit date: 04/29/2007  . Smokeless tobacco: Not on file  . Alcohol Use: Yes     occasional  . Drug Use: No  . Sexually Active: Not on file   Other Topics Concern  . 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 Negative for fatigue.       Has had 2 trips to [place] recently Eating wrong over holidays Wears seat belt  HENT: Positive for hearing loss, congestion, rhinorrhea, dental problem and tinnitus.        Right ear hearing is chronically not great Uses rhinocort for allergies. OTC eye drops also Regular with dentist--2 crowns recently  Eyes: Negative for visual disturbance.       No unilateral vision  loss or diplopia Regular with eye doctor  Respiratory: Negative for cough, chest tightness and shortness of breath.   Cardiovascular: Negative for chest pain, palpitations and leg swelling.  Gastrointestinal: Positive for constipation. Negative for nausea, vomiting, abdominal pain and blood in stool.       Uses stool softener and psyllium for bowels Heartburn is controlled  Genitourinary: Positive for difficulty urinating. Negative for dysuria and urgency.       Some ED Slow urine stream  Musculoskeletal: Positive for back pain and arthralgias. Negative for joint swelling.       Some right knee pain over 10-15 years Back is not bad  Skin: Negative for color change and rash.       No suspicious lesions  Neurological: Positive for headaches. Negative for dizziness, syncope, weakness, light-headedness and numbness.       Gets headaches with weather pressure changes---advil helps  Hematological: Negative for adenopathy. Bruises/bleeds easily.  Psychiatric/Behavioral: Positive for dysphoric mood. Negative for sleep disturbance. The patient is not nervous/anxious.        Gets winter doldrums       Objective:   Physical Exam  Constitutional: He is oriented to person, place, and time. He appears well-developed and well-nourished. No distress.  HENT:  Head: Normocephalic and atraumatic.  Right Ear: External ear normal.  Left Ear: External ear normal.  Mouth/Throat: Oropharynx is clear and moist. No oropharyngeal exudate.       TMs normal  Eyes: Conjunctivae and EOM are normal. Pupils are equal, round, and reactive to light.       Fundi benign  Neck: Normal range of motion. Neck supple. No thyromegaly present.  Cardiovascular: Normal rate, regular rhythm, normal heart sounds and intact distal pulses.  Exam reveals no gallop.   No murmur heard. Pulmonary/Chest: Effort normal and breath sounds normal. No respiratory distress. He has no wheezes. He has no rales.  Abdominal: Soft. There is no  tenderness.  Musculoskeletal: Normal range of motion. He exhibits no edema and no tenderness.  Lymphadenopathy:  He has no cervical adenopathy.  Neurological: He is alert and oriented to person, place, and time.       Normal sensation in feet  Skin: Skin is warm. No rash noted. No erythema.  Psychiatric: He has a normal mood and affect. His behavior is normal. Judgment and thought content normal.          Assessment & Plan:

## 2011-04-18 NOTE — Assessment & Plan Note (Addendum)
Mild symptoms on the doxazosin Wonders about daily cialis---he will look into this from insurance standpoint

## 2011-04-18 NOTE — Assessment & Plan Note (Signed)
Does okay on med No changes

## 2011-04-18 NOTE — Assessment & Plan Note (Signed)
BP Readings from Last 3 Encounters:  04/18/11 120/70  09/07/10 110/60  03/08/10 110/80   Good control Due for labs

## 2011-04-18 NOTE — Assessment & Plan Note (Signed)
Control better on insulin Will check

## 2011-05-11 ENCOUNTER — Other Ambulatory Visit: Payer: Self-pay | Admitting: Internal Medicine

## 2011-05-20 ENCOUNTER — Other Ambulatory Visit: Payer: Self-pay | Admitting: Internal Medicine

## 2011-06-16 ENCOUNTER — Other Ambulatory Visit: Payer: Self-pay | Admitting: Internal Medicine

## 2011-07-12 ENCOUNTER — Telehealth: Payer: Self-pay | Admitting: Internal Medicine

## 2011-07-12 NOTE — Telephone Encounter (Signed)
Caller: Jacob Perkins/Patient; Phone Number: [phone]; Message from caller: Pt calling today 07/12/11 regarding having pain right lumbar area.  Has had surgery about 4 years on back.  Normally takes Diazapam for back spasms prn.  Requesting anti-inflammatory besides OTC Advil be called in.  Pt refused triage assessment or appt.  Wants note sent to MD with request.  Says Dr.  Alphonsus Sias is familiar with his back pain.  Pharmacy is [facility] [phone].  PLEASE CALL PT BACK TO LET HIM KNOW IF SOMETHING HAS BEEN CALLED IN.

## 2011-07-13 MED ORDER — DICLOFENAC SODIUM 75 MG PO TBEC: 75.0000 mg | DELAYED_RELEASE_TABLET | Freq: Two times a day (BID) | ORAL | Status: DC | PRN

## 2011-07-13 NOTE — Telephone Encounter (Signed)
Okay to send order for diclofenac 75mg  bid prn for back pain #60 x 0 Should have eval if not improving over the next couple of weeks

## 2011-07-13 NOTE — Telephone Encounter (Signed)
rx sent to pharmacy by e-script Spoke with patient and advised results, he will call if he needs an appointment

## 2011-08-02 ENCOUNTER — Other Ambulatory Visit: Payer: Self-pay | Admitting: Internal Medicine

## 2011-09-13 ENCOUNTER — Ambulatory Visit: Payer: Self-pay | Admitting: General Practice

## 2011-09-26 ENCOUNTER — Other Ambulatory Visit: Payer: Self-pay | Admitting: Neurosurgery

## 2011-09-26 DIAGNOSIS — M5410 Radiculopathy, site unspecified: Secondary | ICD-10-CM

## 2011-09-26 DIAGNOSIS — M549 Dorsalgia, unspecified: Secondary | ICD-10-CM

## 2011-09-26 HISTORY — PX: OTHER SURGICAL HISTORY: SHX169

## 2011-09-30 ENCOUNTER — Ambulatory Visit
Admission: RE | Admit: 2011-09-30 | Discharge: 2011-09-30 | Disposition: A | Discharge status: Home / Self Care | Payer: PRIVATE HEALTH INSURANCE | Source: Ambulatory Visit | Attending: Neurosurgery | Admitting: Neurosurgery

## 2011-09-30 VITALS — BP 130/71 | HR 61

## 2011-09-30 DIAGNOSIS — M5410 Radiculopathy, site unspecified: Secondary | ICD-10-CM

## 2011-09-30 DIAGNOSIS — M549 Dorsalgia, unspecified: Secondary | ICD-10-CM

## 2011-09-30 MED ORDER — IOHEXOL 180 MG/ML  SOLN
15.0000 mL | Freq: Once | INTRAMUSCULAR | Status: AC | PRN
  Administered 2011-09-30: 15 mL via INTRATHECAL

## 2011-09-30 MED ORDER — DIAZEPAM 5 MG PO TABS
5.0000 mg | ORAL_TABLET | Freq: Once | ORAL | Status: AC
  Administered 2011-09-30: 5 mg via ORAL

## 2011-09-30 NOTE — Progress Notes (Signed)
Patient states he has been off Paroxetine for the past two days.  jkl

## 2011-10-24 ENCOUNTER — Ambulatory Visit: Payer: PRIVATE HEALTH INSURANCE | Admitting: Internal Medicine

## 2011-11-29 ENCOUNTER — Encounter: Payer: Self-pay | Admitting: Internal Medicine

## 2012-05-11 ENCOUNTER — Other Ambulatory Visit: Payer: Self-pay | Admitting: Internal Medicine

## 2012-06-07 ENCOUNTER — Other Ambulatory Visit: Payer: Self-pay | Admitting: Internal Medicine

## 2012-10-25 ENCOUNTER — Encounter: Payer: Self-pay | Admitting: Internal Medicine

## 2013-03-04 ENCOUNTER — Ambulatory Visit: Payer: Self-pay | Admitting: Gastroenterology

## 2013-03-05 LAB — PATHOLOGY REPORT

## 2013-06-17 ENCOUNTER — Encounter: Payer: Self-pay | Admitting: Internal Medicine

## 2013-09-13 DIAGNOSIS — E119 Type 2 diabetes mellitus without complications: Secondary | ICD-10-CM | POA: Insufficient documentation

## 2014-06-10 ENCOUNTER — Ambulatory Visit: Payer: Self-pay | Admitting: Neurosurgery

## 2015-02-09 DIAGNOSIS — M5136 Other intervertebral disc degeneration, lumbar region: Secondary | ICD-10-CM | POA: Insufficient documentation

## 2015-02-09 DIAGNOSIS — M5416 Radiculopathy, lumbar region: Secondary | ICD-10-CM | POA: Insufficient documentation

## 2015-09-28 ENCOUNTER — Ambulatory Visit (INDEPENDENT_AMBULATORY_CARE_PROVIDER_SITE_OTHER): Payer: Managed Care, Other (non HMO) | Admitting: Urology

## 2015-09-28 ENCOUNTER — Encounter: Payer: Self-pay | Admitting: Urology

## 2015-09-28 VITALS — BP 143/75 | HR 71 | Ht 64.0 in | Wt 201.2 lb

## 2015-09-28 DIAGNOSIS — N528 Other male erectile dysfunction: Secondary | ICD-10-CM | POA: Diagnosis not present

## 2015-09-28 DIAGNOSIS — N401 Enlarged prostate with lower urinary tract symptoms: Secondary | ICD-10-CM | POA: Diagnosis not present

## 2015-09-28 DIAGNOSIS — E291 Testicular hypofunction: Secondary | ICD-10-CM

## 2015-09-28 DIAGNOSIS — N529 Male erectile dysfunction, unspecified: Secondary | ICD-10-CM

## 2015-09-28 DIAGNOSIS — N138 Other obstructive and reflux uropathy: Secondary | ICD-10-CM

## 2015-09-28 LAB — BLADDER SCAN AMB NON-IMAGING: Scan Result: 228

## 2015-09-28 MED ORDER — FINASTERIDE 5 MG PO TABS: 5.0000 mg | ORAL_TABLET | Freq: Every day | ORAL | Status: DC

## 2015-09-28 NOTE — Progress Notes (Signed)
09/28/2015 2:12 PM   Jacob Pat Jr. Nov 21, 1950 [id number]  Referring provider: Venia Carbon, MD [place], [place] [address]  Chief Complaint  Patient presents with  . New Patient (Initial Visit)  . Hypogonadism    HPI: Patient is a 65 year old Caucasian male with hypogonadism who had been on AndroGel, but his insurance denied the medication, so he was referred to [place] by Dr. Silvio Pate.  Hypogonadism Patient is experiencing a decrease in libido, a lack of energy, a decrease in strength, a loss in height, a decreased enjoyment in life, sadness and/or grumpiness, erections being less strong and falling asleep after dinner and a recent deterioration in their work performance.  This is indicated by his responses to the ADAM questionnaire.  He is still having spontaneous erections.  He had been managing his hypogonadism with AndroGel for the last five years.  He states he cannot remember why he was screened for low testosterone or the symptoms he was having at the time.         Androgen Deficiency in the Aging Male      09/28/15 1300       Androgen Deficiency in the Aging Male   Do you have a decrease in libido (sex drive) Yes     Do you have lack of energy Yes     Do you have a decrease in strength and/or endurance Yes     Have you lost height Yes     Have you noticed a decreased "enjoyment of life" Yes     Are you sad and/or grumpy Yes     Are your erections less strong Yes     Have you noticed a recent deterioration in your ability to play sports No     Are you falling asleep after dinner Yes     Has there been a recent deterioration in your work performance No       Erectile dysfunction His SHIM score is 23, which is no erectile dysfunction.   He has been having difficulty with erections for the last few.   His major complaint is confidence to keep an erection.  His libido is diminished.   His risk factors for ED are obesity, DM, BPH, HTN and HLD .   He denies any painful erections or curvatures with his erections.        SHIM      09/28/15 1351       SHIM: Over the last 6 months:   How do you rate your confidence that you could get and keep an erection? Moderate     When you had erections with sexual stimulation, how often were your erections hard enough for penetration (entering your partner)? Almost Always or Always     During sexual intercourse, how often were you able to maintain your erection after you had penetrated (entered) your partner? Not Difficult     During sexual intercourse, how difficult was it to maintain your erection to completion of intercourse? Not Difficult     When you attempted sexual intercourse, how often was it satisfactory for you? Not Difficult     SHIM Total Score   SHIM 23        Score: 1-7 Severe ED 8-11 Moderate ED 12-16 Mild-Moderate ED 17-21 Mild ED 22-25 No ED  BPH WITH LUTS His IPSS score today is 17, which is moderate lower urinary tract symptomatology. He is mixed with his quality life  due to his urinary symptoms. His PVR is 228  mL.  His major complaint today urge incontinence.  He has had these symptoms for last several years.  He denies any dysuria, hematuria or suprapubic pain.  He currently taking tamsulosin 0.4 mg daily.  He also denies any recent fevers, chills, nausea or vomiting.  He does not have a family history of PCa.      IPSS      09/28/15 1300       International Prostate Symptom Score   How often have you had the sensation of not emptying your bladder? About half the time     How often have you had to urinate less than every two hours? About half the time     How often have you found you stopped and started again several times when you urinated? Less than half the time     How often have you found it difficult to postpone urination? Less than half the time     How often have you had a weak urinary stream? About half the time     How often have you had to strain to  start urination? Less than half the time     How many times did you typically get up at night to urinate? 2 Times     Total IPSS Score 17     Quality of Life due to urinary symptoms   If you were to spend the rest of your life with your urinary condition just the way it is now how would you feel about that? Mixed        Score:  1-7 Mild 8-19 Moderate 20-35 Severe   PMH: Past Medical History  Diagnosis Date  . Anxiety   . Diabetes mellitus   . Hypertension   . Hyperlipidemia   . Rosacea   . Spinal stenosis     disc disease  . Adenomatous polyp   . BPH (benign prostatic hypertrophy)   . Allergy   . Diverticulosis     Surgical History: Past Surgical History  Procedure Laterality Date  . Asd repair    . Spinal cord decompression    . L5-s1 diskectomy  7/13    Dr Arnoldo Morale    Home Medications:    Medication List       This list is accurate as of: 09/28/15  2:12 PM.  Always use your most recent med list.               aspirin 81 MG tablet  Take 81 mg by mouth daily.     B-D ULTRAFINE III SHORT PEN 31G X 8 MM Misc  Generic drug:  Insulin Pen Needle  USE AS DIRECTED     finasteride 5 MG tablet  Commonly known as:  PROSCAR  Take 1 tablet (5 mg total) by mouth daily.     fish oil-omega-3 fatty acids 1000 MG capsule  Take 1 g by mouth 2 (two) times daily.     fluticasone 50 MCG/ACT nasal spray  Commonly known as:  FLONASE  USE 2 SPRAYS IN EACH NOSTRIL            EVERY DAY     gabapentin 300 MG capsule  Commonly known as:  NEURONTIN     glucosamine-chondroitin 500-400 MG tablet  Take 1 tablet by mouth daily.     HYDROcodone-acetaminophen 10-500 MG tablet  Commonly known as:  LORTAB  Take 1 tablet by mouth every 6 (six)  hours as needed. Reported on 09/28/2015     insulin glargine 100 UNIT/ML injection  Commonly known as:  LANTUS  Inject 40 Units into the skin at bedtime.     metFORMIN 1000 MG tablet  Commonly known as:  GLUCOPHAGE  TAKE ONE TABLET  TWICE A DAY     multivitamin capsule  Take 1 capsule by mouth daily.     ONE TOUCH ULTRA TEST test strip  Generic drug:  glucose blood  USE AS DIRECTED     PARoxetine 20 MG tablet  Commonly known as:  PAXIL  TAKE 1 TABLET EVERY DAY     pyridOXINE 100 MG tablet  Commonly known as:  VITAMIN B-6  Take 100 mg by mouth daily.     ramipril 5 MG capsule  Commonly known as:  ALTACE  TAKE ONE CAPSULE DAILY     Selenium 100 MCG Caps  Take by mouth as needed.     simvastatin 40 MG tablet  Commonly known as:  ZOCOR  TAKE 1 TABLET EVERY DAY     sitaGLIPtin 50 MG tablet  Commonly known as:  JANUVIA  Take by mouth.     tamsulosin 0.4 MG Caps capsule  Commonly known as:  FLOMAX     vitamin C 500 MG tablet  Commonly known as:  ASCORBIC ACID  Take 500 mg by mouth daily.     zinc gluconate 50 MG tablet  Take 50 mg by mouth daily.        Allergies: No Known Allergies  Family History: Family History  Problem Relation Age of Onset  . Diabetes Father   . Urolithiasis Father   . Alcohol abuse Paternal Aunt   . Kidney cancer Neg Hx   . Kidney disease Cousin     Social History:  reports that he quit smoking about 8 years ago. His smoking use included Cigars. He does not have any smokeless tobacco history on file. He reports that he drinks alcohol. He reports that he does not use illicit drugs.  ROS: UROLOGY Frequent Urination?: Yes Hard to postpone urination?: Yes Burning/pain with urination?: Yes Get up at night to urinate?: Yes Leakage of urine?: Yes Urine stream starts and stops?: No Trouble starting stream?: No Do you have to strain to urinate?: Yes Blood in urine?: No Urinary tract infection?: No Sexually transmitted disease?: No Injury to kidneys or bladder?: No Painful intercourse?: No Weak stream?: Yes Erection problems?: No Penile pain?: No  Gastrointestinal Nausea?: No Vomiting?: No Indigestion/heartburn?: No Diarrhea?: No Constipation?:  No  Constitutional Fever: No Night sweats?: No Weight loss?: Yes Fatigue?: No  Skin Skin rash/lesions?: Yes Itching?: No  Eyes Blurred vision?: No Double vision?: No  Ears/Nose/Throat Sore throat?: No Sinus problems?: No  Hematologic/Lymphatic Swollen glands?: No Easy bruising?: No  Cardiovascular Leg swelling?: No Chest pain?: No  Respiratory Cough?: No Shortness of breath?: No  Endocrine Excessive thirst?: No  Musculoskeletal Back pain?: No Joint pain?: Yes  Neurological Headaches?: No Dizziness?: No  Psychologic Depression?: No Anxiety?: No  Physical Exam: BP 143/75 mmHg  Pulse 71  Ht 5\' 4"  (1.626 m)  Wt 201 lb 3.2 oz (91.264 kg)  BMI 34.52 kg/m2  Constitutional: Well nourished. Alert and oriented, No acute distress. HEENT: [place] AT, moist mucus membranes. Trachea midline, no masses. Cardiovascular: No clubbing, cyanosis, or edema. Respiratory: Normal respiratory effort, no increased work of breathing. GI: Abdomen is soft, non tender, non distended, no abdominal masses. Liver and spleen not palpable.  No  hernias appreciated.  Stool sample for occult testing is not indicated.   GU: No CVA tenderness.  No bladder fullness or masses.  Patient with circumcised phallus.  Urethral meatus is patent.  No penile discharge. No penile lesions or rashes. Scrotum without lesions, cysts, rashes and/or edema.  Testicles are located scrotally bilaterally. No masses are appreciated in the testicles. Left and right epididymis are normal. Rectal: Patient with  normal sphincter tone. Anus and perineum without scarring or rashes. No rectal masses are appreciated. Prostate is approximately 55 grams, no nodules are appreciated. Seminal vesicles are normal. Skin: No rashes, bruises or suspicious lesions. Lymph: No cervical or inguinal adenopathy. Neurologic: Grossly intact, no focal deficits, moving all 4 extremities. Psychiatric: Normal mood and affect.  Laboratory  Data: Lab Results  Component Value Date   WBC 7.8 04/18/2011   HGB 14.6 04/18/2011   HCT 43.1 04/18/2011   MCV 91.8 04/18/2011   PLT 202.0 04/18/2011    Lab Results  Component Value Date   CREATININE 0.6 04/18/2011    Lab Results  Component Value Date   PSA 1.48 04/18/2011   PSA 1.24 03/08/2010   PSA 1.18 03/18/2009      Lab Results  Component Value Date   HGBA1C 6.8* 04/18/2011    Lab Results  Component Value Date   TSH 3.13 04/18/2011       Component Value Date/Time   CHOL 137 04/18/2011 1130   HDL 51.30 04/18/2011 1130   CHOLHDL 3 04/18/2011 1130   VLDL 15.2 04/18/2011 1130   LDLCALC 71 04/18/2011 1130    Lab Results  Component Value Date   AST 24 04/18/2011   Lab Results  Component Value Date   ALT 30 04/18/2011    Pertinent Imaging: Results for JAMAIN, COTTE (MRN [id number]) as of 09/28/2015 14:22  Ref. Range 09/28/2015 14:11  Scan Result Unknown 228    Assessment & Plan:    1. Hypogonadism:    I explained to patient that the current recommendations from the Endocrine Society reports the diagnosis of hypogonadism requires a serum total testosterone level obtained between 8 and 10 AM at least 2 days apart that is below the laboratory parameters  for normal testosterone.  Our office obtains testosterone levels before 9 AM as we have found some insurance for these requests this time.    At this time, the patient does not meet this requirement.  He will return in the morning before 9 AM for his second serum testosterone draw.    I discussed with the patient the side effects of testosterone therapy, such as: enlargement of the prostate gland that may in turn cause LUTS, possible increased risk of PCa, DVT's and/or PE's, possible increased risk of heart attack or stroke, lower sperm count, swelling of the ankles, feet, or body, with or without heart failure, enlarged or painful breasts, have problems breathing while you sleep (sleep apnea),  increased prostate specific antigen, mood swings, hypertension and increased red blood cell count.  2. Erectile dysfunction:  SHIM score is 23.  He is not interested in treatment at this time due to his wife having pain during sex.  He masturbates only at this time.    3. BPH with LUTS  - IPSS score is 17/3  - Initiate 5 alpha reductase inhibitor (finasteride), discussed side effects  - Continue tamsulosin 0.4 mg daily  - PSA   Return for return in the am for a testosterone draw (8-10AM).  These notes generated  with voice recognition software. I apologize for typographical errors.  Zara Council, [facility] [address] [place], [place] [address] [phone]

## 2015-09-29 LAB — PSA: Prostate Specific Ag, Serum: 1.9 ng/mL (ref 0.0–4.0)

## 2015-09-30 ENCOUNTER — Other Ambulatory Visit: Payer: Self-pay

## 2015-09-30 DIAGNOSIS — E291 Testicular hypofunction: Secondary | ICD-10-CM

## 2015-10-01 ENCOUNTER — Other Ambulatory Visit: Payer: Managed Care, Other (non HMO)

## 2015-10-01 DIAGNOSIS — E291 Testicular hypofunction: Secondary | ICD-10-CM

## 2015-10-02 LAB — TESTOSTERONE: Testosterone: 283 ng/dL — ABNORMAL LOW (ref 348–1197)

## 2015-10-05 ENCOUNTER — Telehealth: Payer: Self-pay

## 2015-10-05 NOTE — Telephone Encounter (Signed)
-----   Message from Nori Riis, PA-C sent at 10/02/2015  8:23 AM EDT ----- Patient's second testosterone is low.  Please add a FSH, LH, prolactin and estradiol level to his blood work.  He was also going to drop off the letter from his insurance company regarding the non-coverage of his AndroGel.

## 2015-10-05 NOTE — Telephone Encounter (Signed)
Labs were added by Danae Chen at [facility].

## 2015-10-05 NOTE — Telephone Encounter (Signed)
-----   Message from Nori Riis, PA-C sent at 09/30/2015  8:07 AM EDT ----- PSA is stable.  Testosterone is still pending.

## 2015-10-06 LAB — ESTRADIOL: Estradiol: 8.1 pg/mL (ref 7.6–42.6)

## 2015-10-06 LAB — FSH/LH
FSH: 6.4 m[IU]/mL (ref 1.5–12.4)
LH: 7.8 m[IU]/mL (ref 1.7–8.6)

## 2015-10-06 LAB — PROLACTIN: Prolactin: 20.4 ng/mL — ABNORMAL HIGH (ref 4.0–15.2)

## 2015-10-06 LAB — SPECIMEN STATUS REPORT

## 2015-10-16 ENCOUNTER — Telehealth: Payer: Self-pay

## 2015-10-16 DIAGNOSIS — R7989 Other specified abnormal findings of blood chemistry: Secondary | ICD-10-CM

## 2015-10-16 DIAGNOSIS — E229 Hyperfunction of pituitary gland, unspecified: Principal | ICD-10-CM

## 2015-10-16 NOTE — Telephone Encounter (Signed)
Spoke with pt in reference to MRI for elevated prolactin levels. Pt voiced understanding. Orders were placed.

## 2015-10-16 NOTE — Telephone Encounter (Signed)
LMOM

## 2015-10-16 NOTE — Telephone Encounter (Signed)
-----   Message from Nori Riis, PA-C sent at 10/14/2015 11:09 AM EDT ----- I tried to contact the patient, but he did not answer his mobile phone.  His prolactin level is elevated, so he will need an MRI of his brain to rule out a pitiuatary tumor.  If he has further questions, I am happy to speak with him.

## 2015-10-17 ENCOUNTER — Telehealth: Payer: Self-pay | Admitting: Urology

## 2015-10-17 NOTE — Telephone Encounter (Signed)
Please notify Jacob Perkins that I have reviewed the notice of denial for his AndroGel that he received from his insurance him pretty. At this time I am confused as he meets criteria for approval of this medication according to the parameters listed by his insurance company. I suggest we obtain one more serum testosterone before 10 AM and resubmit the prescription for the AndroGel.

## 2015-10-20 NOTE — Telephone Encounter (Signed)
LMOM

## 2015-10-21 ENCOUNTER — Other Ambulatory Visit: Payer: Managed Care, Other (non HMO)

## 2015-10-21 DIAGNOSIS — E291 Testicular hypofunction: Secondary | ICD-10-CM

## 2015-10-22 ENCOUNTER — Telehealth: Payer: Self-pay

## 2015-10-22 LAB — TESTOSTERONE: Testosterone: 303 ng/dL (ref 264–916)

## 2015-10-22 NOTE — Telephone Encounter (Signed)
LMOM

## 2015-10-22 NOTE — Telephone Encounter (Signed)
-----   Message from Nori Riis, PA-C sent at 10/22/2015  8:16 AM EDT ----- Please notify the patient that his second testosterone was within normal limits.  I suggest repeating the morning testosterone in one month.

## 2015-10-27 NOTE — Telephone Encounter (Signed)
-----   Message from Nori Riis, PA-C sent at 10/22/2015  8:16 AM EDT ----- Please notify the patient that his second testosterone was within normal limits.  I suggest repeating the morning testosterone in one month.

## 2015-10-27 NOTE — Telephone Encounter (Signed)
LMOM for patient to call back about labs. 

## 2015-10-28 NOTE — Telephone Encounter (Signed)
LMOM for patient to call back.

## 2015-10-28 NOTE — Telephone Encounter (Signed)
Spoke with patient he understands and states he has appointment already.

## 2015-10-29 NOTE — Telephone Encounter (Signed)
LMOM- will send pt a letter.

## 2015-10-30 ENCOUNTER — Ambulatory Visit
Admission: RE | Admit: 2015-10-30 | Discharge: 2015-10-30 | Disposition: A | Discharge status: Home / Self Care | Payer: Managed Care, Other (non HMO) | Source: Ambulatory Visit | Attending: Urology | Admitting: Urology

## 2015-10-30 DIAGNOSIS — R7989 Other specified abnormal findings of blood chemistry: Secondary | ICD-10-CM

## 2015-10-30 DIAGNOSIS — E229 Hyperfunction of pituitary gland, unspecified: Secondary | ICD-10-CM | POA: Insufficient documentation

## 2015-10-30 LAB — POCT I-STAT CREATININE: Creatinine, Ser: 0.6 mg/dL — ABNORMAL LOW (ref 0.61–1.24)

## 2015-10-30 MED ORDER — GADOBENATE DIMEGLUMINE 529 MG/ML IV SOLN
10.0000 mL | Freq: Once | INTRAVENOUS | Status: AC | PRN
  Administered 2015-10-30: 9 mL via INTRAVENOUS

## 2015-11-24 ENCOUNTER — Other Ambulatory Visit: Payer: Managed Care, Other (non HMO)

## 2015-12-01 ENCOUNTER — Other Ambulatory Visit: Payer: Self-pay

## 2015-12-01 DIAGNOSIS — E291 Testicular hypofunction: Secondary | ICD-10-CM

## 2015-12-02 ENCOUNTER — Other Ambulatory Visit: Payer: Managed Care, Other (non HMO)

## 2015-12-02 DIAGNOSIS — E291 Testicular hypofunction: Secondary | ICD-10-CM

## 2015-12-03 LAB — TESTOSTERONE: Testosterone: 204 ng/dL — ABNORMAL LOW (ref 264–916)

## 2015-12-04 ENCOUNTER — Telehealth: Payer: Self-pay

## 2015-12-04 DIAGNOSIS — E291 Testicular hypofunction: Secondary | ICD-10-CM

## 2015-12-04 MED ORDER — CLOMIPHENE CITRATE 50 MG PO TABS: ORAL_TABLET | ORAL | 0 refills | Status: DC

## 2015-12-04 NOTE — Telephone Encounter (Signed)
-----   Message from Nori Riis, PA-C sent at 12/03/2015  9:55 PM EDT ----- His testosterone level returned below the labs normal parameters.  We can at this time resubmit the prescription for the AndroGel, start Clomid or try a completely different testosterone (pellets, gels, patch).   What would the patient like to do?

## 2015-12-04 NOTE — Telephone Encounter (Signed)
LMOM

## 2015-12-04 NOTE — Telephone Encounter (Signed)
Spoke with pt in reference to testosterone levels and tx. Pt elected to try clomid. Script sent to [facility]. Pt voiced understanding.

## 2015-12-05 ENCOUNTER — Other Ambulatory Visit: Payer: Self-pay | Admitting: Urology

## 2015-12-05 DIAGNOSIS — E291 Testicular hypofunction: Secondary | ICD-10-CM

## 2015-12-05 NOTE — Telephone Encounter (Signed)
Patient will need anppointment in December for blood work and office visit.  He will need HCT, LFTs, morning testosterone before 10 AM and a PSA prior to that visit.

## 2015-12-07 ENCOUNTER — Other Ambulatory Visit: Payer: Self-pay | Admitting: Family Medicine

## 2015-12-07 ENCOUNTER — Telehealth: Payer: Self-pay | Admitting: Family Medicine

## 2015-12-07 DIAGNOSIS — E291 Testicular hypofunction: Secondary | ICD-10-CM

## 2015-12-07 NOTE — Telephone Encounter (Signed)
Left message on machine for patient to return call

## 2015-12-07 NOTE — Telephone Encounter (Signed)
Error

## 2016-01-06 ENCOUNTER — Other Ambulatory Visit: Payer: Managed Care, Other (non HMO)

## 2016-01-06 DIAGNOSIS — E291 Testicular hypofunction: Secondary | ICD-10-CM

## 2016-01-07 ENCOUNTER — Ambulatory Visit (INDEPENDENT_AMBULATORY_CARE_PROVIDER_SITE_OTHER): Payer: Managed Care, Other (non HMO) | Admitting: Urology

## 2016-01-07 ENCOUNTER — Encounter: Payer: Self-pay | Admitting: Urology

## 2016-01-07 ENCOUNTER — Telehealth: Payer: Self-pay | Admitting: Family Medicine

## 2016-01-07 VITALS — BP 156/72 | HR 59 | Ht 64.0 in | Wt 201.1 lb

## 2016-01-07 DIAGNOSIS — N529 Male erectile dysfunction, unspecified: Secondary | ICD-10-CM | POA: Diagnosis not present

## 2016-01-07 DIAGNOSIS — E291 Testicular hypofunction: Secondary | ICD-10-CM | POA: Diagnosis not present

## 2016-01-07 DIAGNOSIS — N138 Other obstructive and reflux uropathy: Secondary | ICD-10-CM | POA: Diagnosis not present

## 2016-01-07 DIAGNOSIS — N401 Enlarged prostate with lower urinary tract symptoms: Secondary | ICD-10-CM

## 2016-01-07 LAB — BLADDER SCAN AMB NON-IMAGING: Scan Result: 147

## 2016-01-07 LAB — TESTOSTERONE: Testosterone: 374 ng/dL (ref 264–916)

## 2016-01-07 MED ORDER — TAMSULOSIN HCL 0.4 MG PO CAPS: 0.4000 mg | ORAL_CAPSULE | Freq: Every day | ORAL | 12 refills | Status: DC

## 2016-01-07 MED ORDER — FINASTERIDE 5 MG PO TABS: 5.0000 mg | ORAL_TABLET | Freq: Every day | ORAL | 3 refills | Status: DC

## 2016-01-07 NOTE — Telephone Encounter (Signed)
Left message for patient to return call.

## 2016-01-07 NOTE — Progress Notes (Signed)
01/07/2016 12:24 PM   Jacob Pat Jr. [birth year] [id number]  Referring provider: Madelyn Brunner, MD [facility] [place], [place] [address]  Chief Complaint  Patient presents with  . Hypogonadism    3 month follow up    HPI: Patient is a 65 year old white male with a history of hypogonadism, erectile dysfunction and BPH with lower urinary tract symptoms who presents today for 3 month follow-up.  Hypogonadism Patient is experiencing a decrease in libido, a lack of energy, a decrease in strength, a loss in height, erections being less strong, a recent deterioration in an ability to play sports and falling asleep after dinner.  This is indicated by his responses to the ADAM questionnaire.  He is still having spontaneous erections at night.  He does not have sleep apnea.   He is currently managing his hypogonadism with Clomid 50 mg, 1/2 tablets daily.  His current serum testosterone level is 374 ng/dL on 01/06/2016.       Androgen Deficiency in the Aging Male    [place] Name 01/07/16 1200         Androgen Deficiency in the Aging Male   Do you have a decrease in libido (sex drive) Yes     Do you have lack of energy Yes     Do you have a decrease in strength and/or endurance Yes     Have you lost height Yes     Have you noticed a decreased "enjoyment of life" No     Are you sad and/or grumpy No     Are your erections less strong Yes     Have you noticed a recent deterioration in your ability to play sports Yes     Are you falling asleep after dinner Yes     Has there been a recent deterioration in your work performance No       Erectile dysfunction His SHIM score is 19, which is mild ED.   His previous SHIM score was 23.  He has been having difficulty with erections for the last few years.   His major complaint is still his confidence to keep an erection.  His libido is diminished.   His risk factors for ED are age, BPH, hypogonadism, spinal  stenosis, DM, HTN, HLD and anxiety.   He denies any painful erections or curvatures with his erections.        SHIM    Row Name 01/07/16 1206         SHIM: Over the last 6 months:   How do you rate your confidence that you could get and keep an erection? Moderate     When you had erections with sexual stimulation, how often were your erections hard enough for penetration (entering your partner)? Most Times (much more than half the time)     During sexual intercourse, how often were you able to maintain your erection after you had penetrated (entered) your partner? Slightly Difficult     During sexual intercourse, how difficult was it to maintain your erection to completion of intercourse? Slightly Difficult     When you attempted sexual intercourse, how often was it satisfactory for you? Slightly Difficult       SHIM Total Score   SHIM 19        Score: 1-7 Severe ED 8-11 Moderate ED 12-16 Mild-Moderate ED 17-21 Mild ED 22-25 No ED   BPH WITH LUTS His IPSS score today is  23, which is severe lower urinary tract symptomatology. He is mixed with his quality life due to his urinary symptoms. His PVR is 147 mL.  His previous IPSS score was 17/3.  His previous PVR is 228 mL.  His major complaint today frequency, urgency, nocturia, incontinence, intermittency, hesitancy and straining to urinate.  He has had these symptoms for two to three years.  He denies any dysuria, hematuria or suprapubic pain.   He currently taking tamsulosin 0.4 mg and finasteride 5 mg daily. He also denies any recent fevers, chills, nausea or vomiting.  He does not have a family history of PCa.      IPSS    Row Name 01/07/16 1200         International Prostate Symptom Score   How often have you had the sensation of not emptying your bladder? Less than half the time     How often have you had to urinate less than every two hours? More than half the time     How often have you found you stopped and started  again several times when you urinated? Less than half the time     How often have you found it difficult to postpone urination? More than half the time     How often have you had a weak urinary stream? More than half the time     How often have you had to strain to start urination? More than half the time     How many times did you typically get up at night to urinate? 3 Times     Total IPSS Score 23       Quality of Life due to urinary symptoms   If you were to spend the rest of your life with your urinary condition just the way it is now how would you feel about that? Mixed        Score:  1-7 Mild 8-19 Moderate 20-35 Severe     PMH: Past Medical History:  Diagnosis Date  . Adenomatous polyp   . Allergy   . Anxiety   . BPH (benign prostatic hypertrophy)   . Diabetes mellitus   . Diverticulosis   . Hyperlipidemia   . Hypertension   . Rosacea   . Spinal stenosis    disc disease    Surgical History: Past Surgical History:  Procedure Laterality Date  . ASD REPAIR    . L5-S1 Diskectomy  7/13   Dr Arnoldo Morale  . SPINAL CORD DECOMPRESSION      Home Medications:    Medication List       Accurate as of 01/07/16 12:24 PM. Always use your most recent med list.          aspirin 81 MG tablet Take 81 mg by mouth daily.   Aspirin-Calcium Carbonate 81-777 MG Tabs Take by mouth.   B-D ULTRAFINE III SHORT PEN 31G X 8 MM Misc Generic drug:  Insulin Pen Needle USE AS DIRECTED   clomiPHENE 50 MG tablet Commonly known as:  CLOMID TAKE 1/2 TABLET BY MOUTH DAILY.   finasteride 5 MG tablet Commonly known as:  PROSCAR Take 1 tablet (5 mg total) by mouth daily.   fish oil-omega-3 fatty acids 1000 MG capsule Take 1 g by mouth 2 (two) times daily.   fluticasone 50 MCG/ACT nasal spray Commonly known as:  FLONASE USE 2 SPRAYS IN EACH NOSTRIL            EVERY DAY   gabapentin  300 MG capsule Commonly known as:  NEURONTIN   glucosamine-chondroitin 500-400 MG tablet Take  1 tablet by mouth daily.   HYDROcodone-acetaminophen 10-500 MG tablet Commonly known as:  LORTAB Take 1 tablet by mouth every 6 (six) hours as needed. Reported on 09/28/2015   insulin glargine 100 UNIT/ML injection Commonly known as:  LANTUS Inject 40 Units into the skin at bedtime.   LANTUS SOLOSTAR 100 UNIT/ML Solostar Pen Generic drug:  Insulin Glargine INJECT 60 UNITS SQ NIGHTLY   metFORMIN 1000 MG tablet Commonly known as:  GLUCOPHAGE TAKE ONE TABLET TWICE A DAY   multivitamin capsule Take 1 capsule by mouth daily.   ONE TOUCH ULTRA TEST test strip Generic drug:  glucose blood USE AS DIRECTED   PARoxetine 20 MG tablet Commonly known as:  PAXIL TAKE 1 TABLET EVERY DAY   pyridOXINE 100 MG tablet Commonly known as:  VITAMIN B-6 Take 100 mg by mouth daily.   ramipril 5 MG capsule Commonly known as:  ALTACE TAKE ONE CAPSULE DAILY   Selenium 100 MCG Caps Take by mouth as needed.   simvastatin 40 MG tablet Commonly known as:  ZOCOR TAKE 1 TABLET EVERY DAY   sitaGLIPtin 50 MG tablet Commonly known as:  JANUVIA Take by mouth.   tamsulosin 0.4 MG Caps capsule Commonly known as:  FLOMAX   vitamin C 500 MG tablet Commonly known as:  ASCORBIC ACID Take 500 mg by mouth daily.   zinc gluconate 50 MG tablet Take 50 mg by mouth daily.       Allergies: No Known Allergies  Family History: Family History  Problem Relation Age of Onset  . Diabetes Father   . Urolithiasis Father   . Alcohol abuse Paternal Aunt   . Kidney disease Cousin   . Kidney cancer Neg Hx   . Prostate cancer Neg Hx     Social History:  reports that he quit smoking about 8 years ago. His smoking use included Cigars. He has never used smokeless tobacco. He reports that he drinks alcohol. He reports that he does not use drugs.  ROS: UROLOGY Frequent Urination?: Yes Hard to postpone urination?: Yes Burning/pain with urination?: No Get up at night to urinate?: Yes Leakage of urine?:  Yes Urine stream starts and stops?: Yes Trouble starting stream?: Yes Do you have to strain to urinate?: Yes Blood in urine?: No Urinary tract infection?: No Sexually transmitted disease?: No Injury to kidneys or bladder?: No Painful intercourse?: No Weak stream?: No Erection problems?: No Penile pain?: No  Gastrointestinal Nausea?: No Vomiting?: No Indigestion/heartburn?: No Diarrhea?: No Constipation?: No  Constitutional Fever: No Night sweats?: No Weight loss?: No Fatigue?: No  Skin Skin rash/lesions?: No Itching?: No  Eyes Blurred vision?: No Double vision?: No  Ears/Nose/Throat Sore throat?: No Sinus problems?: Yes  Hematologic/Lymphatic Swollen glands?: No Easy bruising?: No  Cardiovascular Leg swelling?: No Chest pain?: No  Respiratory Cough?: Yes Shortness of breath?: No  Endocrine Excessive thirst?: No  Musculoskeletal Back pain?: Yes Joint pain?: Yes  Neurological Headaches?: No Dizziness?: No  Psychologic Depression?: No Anxiety?: No  Physical Exam: BP (!) 156/72   Pulse (!) 59   Ht 5\' 4"  (1.626 m)   Wt 201 lb 1.6 oz (91.2 kg)   BMI 34.52 kg/m   Constitutional: Well nourished. Alert and oriented, No acute distress. HEENT: [place] AT, moist mucus membranes. Trachea midline, no masses. Cardiovascular: No clubbing, cyanosis, or edema. Respiratory: Normal respiratory effort, no increased work of breathing. GI: Abdomen  is soft, non tender, non distended, no abdominal masses. Liver and spleen not palpable.  No hernias appreciated.  Stool sample for occult testing is not indicated.   GU: No CVA tenderness.  No bladder fullness or masses.  Patient with circumcised phallus.  Urethral meatus is patent.  No penile discharge. No penile lesions or rashes. Scrotum without lesions, cysts, rashes and/or edema.  Testicles are located scrotally bilaterally. No masses are appreciated in the testicles. Left and right epididymis are normal. Rectal:  Patient with  normal sphincter tone. Anus and perineum without scarring or rashes. No rectal masses are appreciated. Prostate is approximately 50 grams, no nodules are appreciated. Seminal vesicles are normal. Skin: No rashes, bruises or suspicious lesions. Lymph: No cervical or inguinal adenopathy. Neurologic: Grossly intact, no focal deficits, moving all 4 extremities. Psychiatric: Normal mood and affect.  Laboratory Data: Lab Results  Component Value Date   WBC 7.8 04/18/2011   HGB 14.6 04/18/2011   HCT 43.1 04/18/2011   MCV 91.8 04/18/2011   PLT 202.0 04/18/2011    Lab Results  Component Value Date   CREATININE 0.60 (L) 10/30/2015    Lab Results  Component Value Date   PSA 1.48 04/18/2011   PSA 1.24 03/08/2010   PSA 1.18 03/18/2009    Lab Results  Component Value Date   TESTOSTERONE 374 01/06/2016    Lab Results  Component Value Date   HGBA1C 6.8 (H) 04/18/2011  HGBA1C     7.6%  On 12/15/2015  Lab Results  Component Value Date   TSH 3.13 04/18/2011       Component Value Date/Time   CHOL 137 04/18/2011 1130   HDL 51.30 04/18/2011 1130   CHOLHDL 3 04/18/2011 1130   VLDL 15.2 04/18/2011 1130   LDLCALC 71 04/18/2011 1130    Lab Results  Component Value Date   AST 24 04/18/2011   Lab Results  Component Value Date   ALT 30 04/18/2011    Pertinent Imaging: Results for DAVIONTE, LAPLANTE (MRN [id number]) as of 01/17/2016 19:52  Ref. Range 01/07/2016 12:23  Scan Result Unknown [id number]    Assessment & Plan:    1. Hypogonadism:     -most recent testosterone level is 374 ng/dL on 01/06/2016  -continue Clomid 50 mg, 1/2 tablet daily - may increase to one tablet daily  -RTC in 6 months for HCT, testosterone, PSA, LFT's, ADAM and exam  2. BPH with LUTS  - IPSS score is 23/3 , it is worsening  - Continue conservative management, avoiding bladder irritants and timed voiding's  - Continue tamsulosin 0.4 mg daily and finasteride 5 mg daily, refills  given  - offered a work up for a bladder outlet procedure, patient deferred at this time  - RTC in 6 months for IPSS, PSA, PVR and exam, as testosterone therapy can cause prostate enlargement and worsen LUTS  3. Erectile dysfunction:     -SHIM score is 19, it is worsening  -patient not sexually active with his wife at this time, masturbates only  -RTC in 6 months for SHIM score and exam, as testosterone therapy can affect erections    Return in about 6 months (around 07/07/2016) for ADAM, IPSS, SHIM and exam, PSA, LFT's, HCT, testosterone (before 10 AM).  These notes generated with voice recognition software. I apologize for typographical errors.  Zara Council, [facility] [address] [place], [place] [address] [phone]

## 2016-01-07 NOTE — Telephone Encounter (Signed)
Please advise 

## 2016-01-07 NOTE — Telephone Encounter (Signed)
-----   Message from Nori Riis, PA-C sent at 01/07/2016  8:42 AM EDT ----- Please tell the patient that his serum testosterone is normal.  Has he been on the Clomid for one month?

## 2016-01-07 NOTE — Telephone Encounter (Signed)
The patient called back to let us know that he has been on the Clomid for a month. He said you can send him a message via mychart if you need to reach him again.  [email or web address]

## 2016-01-08 ENCOUNTER — Telehealth: Payer: Self-pay

## 2016-01-08 ENCOUNTER — Ambulatory Visit: Payer: Self-pay | Admitting: Urology

## 2016-01-08 LAB — HEPATIC FUNCTION PANEL
ALT: 25 IU/L (ref 0–44)
AST: 17 IU/L (ref 0–40)
Albumin: 4.5 g/dL (ref 3.6–4.8)
Alkaline Phosphatase: 39 IU/L (ref 39–117)
Bilirubin Total: 0.4 mg/dL (ref 0.0–1.2)
Bilirubin, Direct: 0.12 mg/dL (ref 0.00–0.40)
Total Protein: 6.9 g/dL (ref 6.0–8.5)

## 2016-01-08 LAB — PSA: Prostate Specific Ag, Serum: 1.4 ng/mL (ref 0.0–4.0)

## 2016-01-08 LAB — HEMATOCRIT: Hematocrit: 44.5 % (ref 37.5–51.0)

## 2016-01-08 NOTE — Telephone Encounter (Signed)
Spoke with pt in reference to lab results and clomid. Pt stated that he is currently taking a 1/2 tablet and Sunday he will start a whole tablet of clomid.

## 2016-01-08 NOTE — Telephone Encounter (Signed)
Has he been on Clomid 50 mg, 1/2 tablet daily or 1 tablet daily?

## 2016-01-08 NOTE — Telephone Encounter (Signed)
-----   Message from Nori Riis, PA-C sent at 01/08/2016  8:06 AM EDT ----- Patient's labs are normal.

## 2016-06-01 DIAGNOSIS — E119 Type 2 diabetes mellitus without complications: Secondary | ICD-10-CM | POA: Diagnosis not present

## 2016-06-08 DIAGNOSIS — E119 Type 2 diabetes mellitus without complications: Secondary | ICD-10-CM | POA: Diagnosis not present

## 2016-06-08 DIAGNOSIS — I10 Essential (primary) hypertension: Secondary | ICD-10-CM | POA: Diagnosis not present

## 2016-07-04 ENCOUNTER — Other Ambulatory Visit: Payer: Self-pay

## 2016-07-04 DIAGNOSIS — E291 Testicular hypofunction: Secondary | ICD-10-CM

## 2016-07-05 ENCOUNTER — Other Ambulatory Visit: Payer: Medicare HMO

## 2016-07-05 DIAGNOSIS — N138 Other obstructive and reflux uropathy: Secondary | ICD-10-CM | POA: Diagnosis not present

## 2016-07-05 DIAGNOSIS — E291 Testicular hypofunction: Secondary | ICD-10-CM

## 2016-07-05 DIAGNOSIS — N401 Enlarged prostate with lower urinary tract symptoms: Secondary | ICD-10-CM | POA: Diagnosis not present

## 2016-07-05 DIAGNOSIS — R69 Illness, unspecified: Secondary | ICD-10-CM | POA: Diagnosis not present

## 2016-07-06 LAB — HEPATIC FUNCTION PANEL
ALT: 30 IU/L (ref 0–44)
AST: 18 IU/L (ref 0–40)
Albumin: 4.3 g/dL (ref 3.6–4.8)
Alkaline Phosphatase: 29 IU/L — ABNORMAL LOW (ref 39–117)
Bilirubin Total: 0.3 mg/dL (ref 0.0–1.2)
Bilirubin, Direct: 0.1 mg/dL (ref 0.00–0.40)
Total Protein: 6.3 g/dL (ref 6.0–8.5)

## 2016-07-06 LAB — PSA: Prostate Specific Ag, Serum: 1 ng/mL (ref 0.0–4.0)

## 2016-07-06 LAB — TESTOSTERONE: Testosterone: 433 ng/dL (ref 264–916)

## 2016-07-06 LAB — HEMATOCRIT: Hematocrit: 45.1 % (ref 37.5–51.0)

## 2016-07-06 NOTE — Progress Notes (Deleted)
07/07/2016 10:14 AM   Jacob Pat Jr. 10-08-1950 [id number]  Referring provider: Madelyn Brunner, MD [facility] [place], [place] [address]  No chief complaint on file.   HPI: Patient is a 66 year old WM with hypogonadism, erectile dysfunction and BPH with lower urinary tract symptoms who presents today for 6 month follow-up.  Hypogonadism Patient is experiencing a decrease in libido, a lack of energy, a decrease in strength, a loss in height, erections being less strong, a recent deterioration in an ability to play sports and falling asleep after dinner.  This is indicated by his responses to the ADAM questionnaire.  He is still having spontaneous erections at night.  He does not have sleep apnea.   He is currently managing his hypogonadism with Clomid 50 mg, 1/2 tablets daily.  His current serum testosterone level is 433 ng/dL on 07/05/2016.  His HCT and LFT's are normal.    Erectile dysfunction His SHIM score is 23, which is mild ED.   His previous SHIM score was 19.  He has been having difficulty with erections for the last few years.   His major complaint is still his confidence to keep an erection.  His libido is diminished.   His risk factors for ED are age, BPH, hypogonadism, spinal stenosis, DM, HTN, HLD and anxiety.   He denies any painful erections or curvatures with his erections.      Score: 1-7 Severe ED 8-11 Moderate ED 12-16 Mild-Moderate ED 17-21 Mild ED 22-25 No ED   BPH WITH LUTS His IPSS score today is ***, which is *** lower urinary tract symptomatology. He is *** with his quality life due to his urinary symptoms. His PVR is *** mL.  His previous IPSS score was 23/3.  His previous PVR is 147 mL.  His major complaint today frequency, urgency, nocturia, incontinence, intermittency, hesitancy and straining to urinate.  He has had these symptoms for two to three years.  He denies any dysuria, hematuria or suprapubic pain.   He  currently taking tamsulosin 0.4 mg and finasteride 5 mg daily. He also denies any recent fevers, chills, nausea or vomiting.  He does not have a family history of PCa.    Score:  1-7 Mild 8-19 Moderate 20-35 Severe     PMH: Past Medical History:  Diagnosis Date  . Adenomatous polyp   . Allergy   . Anxiety   . BPH (benign prostatic hypertrophy)   . Diabetes mellitus   . Diverticulosis   . Hyperlipidemia   . Hypertension   . Rosacea   . Spinal stenosis    disc disease    Surgical History: Past Surgical History:  Procedure Laterality Date  . ASD REPAIR    . L5-S1 Diskectomy  7/13   Dr Arnoldo Morale  . SPINAL CORD DECOMPRESSION      Home Medications:  Allergies as of 07/07/2016   No Known Allergies     Medication List       Accurate as of 07/06/16 10:14 AM. Always use your most recent med list.          aspirin 81 MG tablet Take 81 mg by mouth daily.   Aspirin-Calcium Carbonate 81-777 MG Tabs Take by mouth.   B-D ULTRAFINE III SHORT PEN 31G X 8 MM Misc Generic drug:  Insulin Pen Needle USE AS DIRECTED   clomiPHENE 50 MG tablet Commonly known as:  CLOMID TAKE 1/2 TABLET BY MOUTH DAILY.  finasteride 5 MG tablet Commonly known as:  PROSCAR Take 1 tablet (5 mg total) by mouth daily.   fish oil-omega-3 fatty acids 1000 MG capsule Take 1 g by mouth 2 (two) times daily.   fluticasone 50 MCG/ACT nasal spray Commonly known as:  FLONASE USE 2 SPRAYS IN EACH NOSTRIL            EVERY DAY   gabapentin 300 MG capsule Commonly known as:  NEURONTIN   glucosamine-chondroitin 500-400 MG tablet Take 1 tablet by mouth daily.   HYDROcodone-acetaminophen 10-500 MG tablet Commonly known as:  LORTAB Take 1 tablet by mouth every 6 (six) hours as needed. Reported on 09/28/2015   insulin glargine 100 UNIT/ML injection Commonly known as:  LANTUS Inject 40 Units into the skin at bedtime.   LANTUS SOLOSTAR 100 UNIT/ML Solostar Pen Generic drug:  Insulin  Glargine INJECT 60 UNITS SQ NIGHTLY   metFORMIN 1000 MG tablet Commonly known as:  GLUCOPHAGE TAKE ONE TABLET TWICE A DAY   multivitamin capsule Take 1 capsule by mouth daily.   ONE TOUCH ULTRA TEST test strip Generic drug:  glucose blood USE AS DIRECTED   PARoxetine 20 MG tablet Commonly known as:  PAXIL TAKE 1 TABLET EVERY DAY   pyridOXINE 100 MG tablet Commonly known as:  VITAMIN B-6 Take 100 mg by mouth daily.   ramipril 5 MG capsule Commonly known as:  ALTACE TAKE ONE CAPSULE DAILY   Selenium 100 MCG Caps Take by mouth as needed.   simvastatin 40 MG tablet Commonly known as:  ZOCOR TAKE 1 TABLET EVERY DAY   sitaGLIPtin 50 MG tablet Commonly known as:  JANUVIA Take by mouth.   tamsulosin 0.4 MG Caps capsule Commonly known as:  FLOMAX Take 1 capsule (0.4 mg total) by mouth daily.   vitamin C 500 MG tablet Commonly known as:  ASCORBIC ACID Take 500 mg by mouth daily.   zinc gluconate 50 MG tablet Take 50 mg by mouth daily.       Allergies: No Known Allergies  Family History: Family History  Problem Relation Age of Onset  . Diabetes Father   . Urolithiasis Father   . Alcohol abuse Paternal Aunt   . Kidney disease Cousin   . Kidney cancer Neg Hx   . Prostate cancer Neg Hx     Social History:  reports that he quit smoking about 9 years ago. His smoking use included Cigars. He has never used smokeless tobacco. He reports that he drinks alcohol. He reports that he does not use drugs.  ROS:                                        Physical Exam: There were no vitals taken for this visit.  Constitutional: Well nourished. Alert and oriented, No acute distress. HEENT: [place] AT, moist mucus membranes. Trachea midline, no masses. Cardiovascular: No clubbing, cyanosis, or edema. Respiratory: Normal respiratory effort, no increased work of breathing. GI: Abdomen is soft, non tender, non distended, no abdominal masses. Liver and  spleen not palpable.  No hernias appreciated.  Stool sample for occult testing is not indicated.   GU: No CVA tenderness.  No bladder fullness or masses.  Patient with circumcised phallus.  Urethral meatus is patent.  No penile discharge. No penile lesions or rashes. Scrotum without lesions, cysts, rashes and/or edema.  Testicles are located scrotally bilaterally. No masses  are appreciated in the testicles. Left and right epididymis are normal. Rectal: Patient with  normal sphincter tone. Anus and perineum without scarring or rashes. No rectal masses are appreciated. Prostate is approximately 50 grams, no nodules are appreciated. Seminal vesicles are normal. Skin: No rashes, bruises or suspicious lesions. Lymph: No cervical or inguinal adenopathy. Neurologic: Grossly intact, no focal deficits, moving all 4 extremities. Psychiatric: Normal mood and affect.  Laboratory Data: Lab Results  Component Value Date   WBC 7.8 04/18/2011   HGB 14.6 04/18/2011   HCT 45.1 07/05/2016   MCV 91.8 04/18/2011   PLT 202.0 04/18/2011    Lab Results  Component Value Date   CREATININE 0.60 (L) 10/30/2015   PSA 1.0 ng/mL on 07/05/2016 Lab Results  Component Value Date   PSA 1.48 04/18/2011   PSA 1.24 03/08/2010   PSA 1.18 03/18/2009    Lab Results  Component Value Date   TESTOSTERONE 433 07/05/2016    Lab Results  Component Value Date   HGBA1C 6.8 (H) 04/18/2011  HGBA1C     7.6%  On 12/15/2015  Lab Results  Component Value Date   TSH 3.13 04/18/2011       Component Value Date/Time   CHOL 137 04/18/2011 1130   HDL 51.30 04/18/2011 1130   CHOLHDL 3 04/18/2011 1130   VLDL 15.2 04/18/2011 1130   LDLCALC 71 04/18/2011 1130    Lab Results  Component Value Date   AST 18 07/05/2016   Lab Results  Component Value Date   ALT 30 07/05/2016    Pertinent Imaging: ***   Assessment & Plan:    1. Hypogonadism:     -most recent testosterone level is 433ng/dL on 07/05/2016  -continue  Clomid 50 mg, 1/2 tablet daily - may increase to one tablet daily ***  -RTC in 6 months for HCT, testosterone, PSA, LFT's, ADAM and exam  2. BPH with LUTS  - IPSS score is 23/3 , it is worsening  - Continue conservative management, avoiding bladder irritants and timed voiding's  - Continue tamsulosin 0.4 mg daily and finasteride 5 mg daily, refills given  - offered a work up for a bladder outlet procedure, patient deferred at this time  - RTC in 6 months for IPSS, PSA, PVR and exam, as testosterone therapy can cause prostate enlargement and worsen LUTS  3. Erectile dysfunction:     -SHIM score is 19, it is worsening  -patient not sexually active with his wife at this time, masturbates only  -RTC in 6 months for SHIM score and exam, as testosterone therapy can affect erections    No Follow-up on file.  These notes generated with voice recognition software. I apologize for typographical errors.  Zara Council, [facility] [address] [place],  [address] [phone]

## 2016-07-07 ENCOUNTER — Encounter: Payer: Self-pay | Admitting: Urology

## 2016-07-07 ENCOUNTER — Ambulatory Visit (INDEPENDENT_AMBULATORY_CARE_PROVIDER_SITE_OTHER): Payer: Medicare HMO | Admitting: Urology

## 2016-07-07 ENCOUNTER — Ambulatory Visit: Payer: Managed Care, Other (non HMO) | Admitting: Urology

## 2016-07-07 VITALS — BP 163/82 | HR 56 | Ht 66.0 in | Wt 200.9 lb

## 2016-07-07 DIAGNOSIS — N138 Other obstructive and reflux uropathy: Secondary | ICD-10-CM | POA: Diagnosis not present

## 2016-07-07 DIAGNOSIS — N400 Enlarged prostate without lower urinary tract symptoms: Secondary | ICD-10-CM

## 2016-07-07 DIAGNOSIS — N529 Male erectile dysfunction, unspecified: Secondary | ICD-10-CM | POA: Diagnosis not present

## 2016-07-07 DIAGNOSIS — E291 Testicular hypofunction: Secondary | ICD-10-CM | POA: Diagnosis not present

## 2016-07-07 DIAGNOSIS — N401 Enlarged prostate with lower urinary tract symptoms: Secondary | ICD-10-CM | POA: Diagnosis not present

## 2016-07-07 LAB — BLADDER SCAN AMB NON-IMAGING: Scan Result: 133

## 2016-07-07 MED ORDER — FINASTERIDE 5 MG PO TABS: 5.0000 mg | ORAL_TABLET | Freq: Every day | ORAL | 3 refills | Status: DC

## 2016-07-07 MED ORDER — CLOMIPHENE CITRATE 50 MG PO TABS: 25.0000 mg | ORAL_TABLET | Freq: Every day | ORAL | 1 refills | Status: DC

## 2016-07-07 MED ORDER — TAMSULOSIN HCL 0.4 MG PO CAPS: 0.4000 mg | ORAL_CAPSULE | Freq: Every day | ORAL | 3 refills | Status: DC

## 2016-07-07 NOTE — Progress Notes (Signed)
07/07/2016 11:40 AM   Jacob Perkins. 1950/06/04 [id number]  Referring provider: Madelyn Brunner, MD [facility] [place], [place] [address]  Chief Complaint  Patient presents with  . Benign Prostatic Hypertrophy    6 month follow up  . Hypogonadism    HPI: Patient is a 66 year old WM with hypogonadism, erectile dysfunction and BPH with lower urinary tract symptoms who presents today for 6 month follow-up.  Hypogonadism Patient is experiencing a decrease in libido, a lack of energy, a loss in height and falling asleep after dinner.  This is indicated by his responses to the ADAM questionnaire.  He is no longer having spontaneous erections at night.  He does not have sleep apnea.   He is currently managing his hypogonadism with Clomid 50 mg, 1/2 tablets daily.  His current serum testosterone level is 433 ng/dL on 07/05/2016.  His HCT and LFT's are normal.       Androgen Deficiency in the Aging Male    Martin Name 07/07/16 1100         Androgen Deficiency in the Aging Male   Do you have a decrease in libido (sex drive) Yes     Do you have lack of energy Yes     Do you have a decrease in strength and/or endurance No     Have you lost height Yes     Have you noticed a decreased "enjoyment of life" No     Are you sad and/or grumpy No     Are your erections less strong No     Have you noticed a recent deterioration in your ability to play sports No     Are you falling asleep after dinner Yes     Has there been a recent deterioration in your work performance No       Erectile dysfunction His SHIM score is 20, which is mild ED.   His previous SHIM score was 19.  He has been having difficulty with erections for the last few years.   His major complaint is still his confidence to keep an erection.  His libido is diminished.   His risk factors for ED are age, BPH, hypogonadism, spinal stenosis, DM, HTN, HLD and anxiety.   He denies any painful  erections or curvatures with his erections.        SHIM    Row Name 07/07/16 1107         SHIM: Over the last 6 months:   How do you rate your confidence that you could get and keep an erection? High     When you had erections with sexual stimulation, how often were your erections hard enough for penetration (entering your partner)? Most Times (much more than half the time)     During sexual intercourse, how often were you able to maintain your erection after you had penetrated (entered) your partner? Most Times (much more than half the time)     During sexual intercourse, how difficult was it to maintain your erection to completion of intercourse? Slightly Difficult     When you attempted sexual intercourse, how often was it satisfactory for you? Most Times (much more than half the time)       SHIM Total Score   SHIM 20        Score: 1-7 Severe ED 8-11 Moderate ED 12-16 Mild-Moderate ED 17-21 Mild ED 22-25 No ED   BPH WITH LUTS His  IPSS score today is 25, which is severe lower urinary tract symptomatology. He is mostly satisfied with his quality life due to his urinary symptoms. His PVR is 133 mL.  His previous IPSS score was 23/3.  His previous PVR is 147 mL.  His major complaint today frequency, urgency, incontinence and straining to urinate.  He has had these symptoms for two to three years.  He denies any dysuria, hematuria or suprapubic pain.   He currently taking tamsulosin 0.4 mg and finasteride 5 mg daily. He also denies any recent fevers, chills, nausea or vomiting.  He does not have a family history of PCa.      IPSS    Row Name 07/07/16 1100         International Prostate Symptom Score   How often have you had the sensation of not emptying your bladder? About half the time     How often have you had to urinate less than every two hours? Almost always     How often have you found you stopped and started again several times when you urinated? Less than half the  time     How often have you found it difficult to postpone urination? Almost always     How often have you had a weak urinary stream? Less than half the time     How often have you had to strain to start urination? About half the time     How many times did you typically get up at night to urinate? 5 Times     Total IPSS Score 25       Quality of Life due to urinary symptoms   If you were to spend the rest of your life with your urinary condition just the way it is now how would you feel about that? Mostly Satisfied        Score:  1-7 Mild 8-19 Moderate 20-35 Severe     PMH: Past Medical History:  Diagnosis Date  . Adenomatous polyp   . Allergy   . Anxiety   . BPH (benign prostatic hypertrophy)   . Diabetes mellitus   . Diverticulosis   . Hyperlipidemia   . Hypertension   . Rosacea   . Spinal stenosis    disc disease    Surgical History: Past Surgical History:  Procedure Laterality Date  . ASD REPAIR    . L5-S1 Diskectomy  7/13   Dr Arnoldo Morale  . SPINAL CORD DECOMPRESSION      Home Medications:  Allergies as of 07/07/2016   No Known Allergies     Medication List       Accurate as of 07/07/16 11:40 AM. Always use your most recent med list.          aspirin 81 MG tablet Take 81 mg by mouth daily.   Aspirin-Calcium Carbonate 81-777 MG Tabs Take by mouth.   B-D ULTRAFINE III SHORT PEN 31G X 8 MM Misc Generic drug:  Insulin Pen Needle USE AS DIRECTED   clomiPHENE 50 MG tablet Commonly known as:  CLOMID Take 0.5 tablets (25 mg total) by mouth daily.   finasteride 5 MG tablet Commonly known as:  PROSCAR Take 1 tablet (5 mg total) by mouth daily.   fish oil-omega-3 fatty acids 1000 MG capsule Take 1 g by mouth 2 (two) times daily.   fluticasone 50 MCG/ACT nasal spray Commonly known as:  FLONASE USE 2 SPRAYS IN EACH NOSTRIL  EVERY DAY   gabapentin 300 MG capsule Commonly known as:  NEURONTIN   glucosamine-chondroitin 500-400 MG  tablet Take 1 tablet by mouth daily.   HYDROcodone-acetaminophen 10-500 MG tablet Commonly known as:  LORTAB Take 1 tablet by mouth every 6 (six) hours as needed. Reported on 09/28/2015   insulin glargine 100 UNIT/ML injection Commonly known as:  LANTUS Inject 40 Units into the skin at bedtime.   LANTUS SOLOSTAR 100 UNIT/ML Solostar Pen Generic drug:  Insulin Glargine INJECT 60 UNITS SQ NIGHTLY   metFORMIN 1000 MG tablet Commonly known as:  GLUCOPHAGE TAKE ONE TABLET TWICE A DAY   multivitamin capsule Take 1 capsule by mouth daily.   ONE TOUCH ULTRA TEST test strip Generic drug:  glucose blood USE AS DIRECTED   PARoxetine 20 MG tablet Commonly known as:  PAXIL TAKE 1 TABLET EVERY DAY   pyridOXINE 100 MG tablet Commonly known as:  VITAMIN B-6 Take 100 mg by mouth daily.   ramipril 5 MG capsule Commonly known as:  ALTACE TAKE ONE CAPSULE DAILY   Selenium 100 MCG Caps Take by mouth as needed.   simvastatin 40 MG tablet Commonly known as:  ZOCOR TAKE 1 TABLET EVERY DAY   sitaGLIPtin 50 MG tablet Commonly known as:  JANUVIA Take by mouth.   JANUVIA 50 MG tablet Generic drug:  sitaGLIPtin TAKE 1 TABLET EVERY DAY   tamsulosin 0.4 MG Caps capsule Commonly known as:  FLOMAX Take 1 capsule (0.4 mg total) by mouth daily.   vitamin C 500 MG tablet Commonly known as:  ASCORBIC ACID Take 500 mg by mouth daily.   zinc gluconate 50 MG tablet Take 50 mg by mouth daily.       Allergies: No Known Allergies  Family History: Family History  Problem Relation Age of Onset  . Diabetes Father   . Urolithiasis Father   . Alcohol abuse Paternal Aunt   . Kidney disease Cousin   . Kidney cancer Neg Hx   . Prostate cancer Neg Hx   . Bladder Cancer Neg Hx     Social History:  reports that he quit smoking about 9 years ago. His smoking use included Cigars. He has never used smokeless tobacco. He reports that he drinks alcohol. He reports that he does not use  drugs.  ROS: UROLOGY Frequent Urination?: Yes Hard to postpone urination?: Yes Burning/pain with urination?: No Get up at night to urinate?: No Leakage of urine?: Yes Urine stream starts and stops?: No Trouble starting stream?: No Do you have to strain to urinate?: Yes Blood in urine?: No Urinary tract infection?: No Sexually transmitted disease?: No Injury to kidneys or bladder?: No Painful intercourse?: No Weak stream?: No Erection problems?: No Penile pain?: No  Gastrointestinal Nausea?: No Vomiting?: No Indigestion/heartburn?: No Diarrhea?: No Constipation?: No  Constitutional Fever: No Night sweats?: No Weight loss?: No Fatigue?: No  Skin Skin rash/lesions?: No Itching?: No  Eyes Blurred vision?: No Double vision?: No  Ears/Nose/Throat Sore throat?: No Sinus problems?: No  Hematologic/Lymphatic Swollen glands?: No Easy bruising?: No  Cardiovascular Leg swelling?: No Chest pain?: No  Respiratory Cough?: No Shortness of breath?: No  Endocrine Excessive thirst?: No  Musculoskeletal Back pain?: No Joint pain?: No  Neurological Headaches?: No Dizziness?: No  Psychologic Depression?: No Anxiety?: No  Physical Exam: BP (!) 163/82   Pulse (!) 56   Ht 5\' 6"  (1.676 m)   Wt 200 lb 14.4 oz (91.1 kg)   BMI 32.43 kg/m  Constitutional: Well nourished. Alert and oriented, No acute distress. HEENT: [place] AT, moist mucus membranes. Trachea midline, no masses. Cardiovascular: No clubbing, cyanosis, or edema. Respiratory: Normal respiratory effort, no increased work of breathing. GI: Abdomen is soft, non tender, non distended, no abdominal masses. Liver and spleen not palpable.  No hernias appreciated.  Stool sample for occult testing is not indicated.   GU: No CVA tenderness.  No bladder fullness or masses.  Patient with circumcised phallus.  Urethral meatus is patent.  No penile discharge. No penile lesions or rashes. Scrotum without lesions,  cysts, rashes and/or edema.  Testicles are located scrotally bilaterally. No masses are appreciated in the testicles. Left and right epididymis are normal. Rectal: Patient with  normal sphincter tone. Anus and perineum without scarring or rashes. No rectal masses are appreciated. Prostate is approximately 50 grams, no nodules are appreciated. Seminal vesicles are normal. Skin: No rashes, bruises or suspicious lesions. Lymph: No cervical or inguinal adenopathy. Neurologic: Grossly intact, no focal deficits, moving all 4 extremities. Psychiatric: Normal mood and affect.  Laboratory Data: Lab Results  Component Value Date   WBC 7.8 04/18/2011   HGB 14.6 04/18/2011   HCT 45.1 07/05/2016   MCV 91.8 04/18/2011   PLT 202.0 04/18/2011    Lab Results  Component Value Date   CREATININE 0.60 (L) 10/30/2015   PSA 1.0 ng/mL on 07/05/2016 Lab Results  Component Value Date   PSA 1.48 04/18/2011   PSA 1.24 03/08/2010   PSA 1.18 03/18/2009    Lab Results  Component Value Date   TESTOSTERONE 433 07/05/2016    Lab Results  Component Value Date   HGBA1C 6.8 (H) 04/18/2011  HGBA1C     7.6%  On 12/15/2015  Lab Results  Component Value Date   TSH 3.13 04/18/2011       Component Value Date/Time   CHOL 137 04/18/2011 1130   HDL 51.30 04/18/2011 1130   CHOLHDL 3 04/18/2011 1130   VLDL 15.2 04/18/2011 1130   LDLCALC 71 04/18/2011 1130    Lab Results  Component Value Date   AST 18 07/05/2016   Lab Results  Component Value Date   ALT 30 07/05/2016    Pertinent Imaging: Results for NATHANAEL, KRIST (MRN [id number]) as of 07/07/2016 22:33  Ref. Range 07/07/2016 11:10  Scan Result Unknown 133     Assessment & Plan:    1. Hypogonadism:     -most recent testosterone level is 433ng/dL on 07/05/2016  -continue Clomid 50 mg, 1/2 tablet daily; refills given  -RTC in 6 months for HCT, testosterone, PSA, LFT's, ADAM and exam  2. BPH with LUTS  - IPSS score is 23/3 , it is  worsening  - Continue conservative management, avoiding bladder irritants and timed voiding's  - Continue tamsulosin 0.4 mg daily and finasteride 5 mg daily, refills given  - offered a work up for a bladder outlet procedure, patient deferred at this time  - RTC in 6 months for IPSS, PSA, PVR and exam, as testosterone therapy can cause prostate enlargement and worsen LUTS  3. Erectile dysfunction:     -SHIM score is 20 it is improving  -patient not sexually active with his wife at this time, masturbates only  -RTC in 6 months for SHIM score and exam, as testosterone therapy can affect erections    Return in about 6 months (around 01/06/2017) for PSA, LFT's, HCT, testosterone (before 10 AM), ADAM, IPSS, SHIM and exam.  These notes generated with  voice recognition software. I apologize for typographical errors.  Zara Council, [facility] [address], [place], [place] [address] [phone]

## 2016-08-24 DIAGNOSIS — R69 Illness, unspecified: Secondary | ICD-10-CM | POA: Diagnosis not present

## 2016-09-06 DIAGNOSIS — E119 Type 2 diabetes mellitus without complications: Secondary | ICD-10-CM | POA: Diagnosis not present

## 2016-09-14 ENCOUNTER — Other Ambulatory Visit: Payer: Self-pay | Admitting: Urology

## 2016-09-14 DIAGNOSIS — E291 Testicular hypofunction: Secondary | ICD-10-CM

## 2016-09-20 DIAGNOSIS — E119 Type 2 diabetes mellitus without complications: Secondary | ICD-10-CM | POA: Diagnosis not present

## 2016-09-20 DIAGNOSIS — I10 Essential (primary) hypertension: Secondary | ICD-10-CM | POA: Diagnosis not present

## 2016-10-17 DIAGNOSIS — R69 Illness, unspecified: Secondary | ICD-10-CM | POA: Diagnosis not present

## 2016-11-18 DIAGNOSIS — M5416 Radiculopathy, lumbar region: Secondary | ICD-10-CM | POA: Diagnosis not present

## 2016-11-18 DIAGNOSIS — M5136 Other intervertebral disc degeneration, lumbar region: Secondary | ICD-10-CM | POA: Diagnosis not present

## 2016-12-05 DIAGNOSIS — R69 Illness, unspecified: Secondary | ICD-10-CM | POA: Diagnosis not present

## 2016-12-16 DIAGNOSIS — M5136 Other intervertebral disc degeneration, lumbar region: Secondary | ICD-10-CM | POA: Diagnosis not present

## 2016-12-16 DIAGNOSIS — M5416 Radiculopathy, lumbar region: Secondary | ICD-10-CM | POA: Diagnosis not present

## 2016-12-23 ENCOUNTER — Other Ambulatory Visit: Payer: Self-pay | Admitting: Urology

## 2016-12-23 DIAGNOSIS — E291 Testicular hypofunction: Secondary | ICD-10-CM

## 2016-12-26 ENCOUNTER — Encounter: Payer: Self-pay | Admitting: Urology

## 2016-12-26 DIAGNOSIS — R69 Illness, unspecified: Secondary | ICD-10-CM | POA: Diagnosis not present

## 2016-12-27 DIAGNOSIS — I10 Essential (primary) hypertension: Secondary | ICD-10-CM | POA: Diagnosis not present

## 2016-12-27 DIAGNOSIS — E119 Type 2 diabetes mellitus without complications: Secondary | ICD-10-CM | POA: Diagnosis not present

## 2016-12-27 DIAGNOSIS — K219 Gastro-esophageal reflux disease without esophagitis: Secondary | ICD-10-CM | POA: Diagnosis not present

## 2016-12-27 DIAGNOSIS — E7800 Pure hypercholesterolemia, unspecified: Secondary | ICD-10-CM | POA: Diagnosis not present

## 2016-12-27 DIAGNOSIS — E669 Obesity, unspecified: Secondary | ICD-10-CM | POA: Diagnosis not present

## 2016-12-27 DIAGNOSIS — I2510 Atherosclerotic heart disease of native coronary artery without angina pectoris: Secondary | ICD-10-CM | POA: Diagnosis not present

## 2016-12-27 DIAGNOSIS — Z0000 Encounter for general adult medical examination without abnormal findings: Secondary | ICD-10-CM | POA: Diagnosis not present

## 2016-12-27 DIAGNOSIS — R69 Illness, unspecified: Secondary | ICD-10-CM | POA: Diagnosis not present

## 2016-12-27 DIAGNOSIS — C679 Malignant neoplasm of bladder, unspecified: Secondary | ICD-10-CM | POA: Diagnosis not present

## 2017-01-02 ENCOUNTER — Other Ambulatory Visit: Payer: Self-pay

## 2017-01-02 ENCOUNTER — Other Ambulatory Visit: Payer: Medicare HMO

## 2017-01-02 DIAGNOSIS — N401 Enlarged prostate with lower urinary tract symptoms: Secondary | ICD-10-CM | POA: Diagnosis not present

## 2017-01-02 DIAGNOSIS — E291 Testicular hypofunction: Secondary | ICD-10-CM

## 2017-01-03 LAB — HEMOGLOBIN: Hemoglobin: 13.9 g/dL (ref 13.0–17.7)

## 2017-01-03 LAB — TESTOSTERONE: Testosterone: 491 ng/dL (ref 264–916)

## 2017-01-03 LAB — HEPATIC FUNCTION PANEL
ALT: 26 IU/L (ref 0–44)
AST: 19 IU/L (ref 0–40)
Albumin: 4.1 g/dL (ref 3.6–4.8)
Alkaline Phosphatase: 28 IU/L — ABNORMAL LOW (ref 39–117)
Bilirubin Total: 0.3 mg/dL (ref 0.0–1.2)
Bilirubin, Direct: 0.08 mg/dL (ref 0.00–0.40)
Total Protein: 5.9 g/dL — ABNORMAL LOW (ref 6.0–8.5)

## 2017-01-03 LAB — HEMATOCRIT: Hematocrit: 42.9 % (ref 37.5–51.0)

## 2017-01-03 LAB — PSA: Prostate Specific Ag, Serum: 0.9 ng/mL (ref 0.0–4.0)

## 2017-01-04 NOTE — Progress Notes (Signed)
01/05/2017 11:50 AM   Jacob Perkins. 25-Oct-1950 [id number]  Referring provider: Madelyn Brunner, MD [facility] [place], [place] [address]  Chief Complaint  Patient presents with  . Hypogonadism    6 month follow up  . Erectile Dysfunction    HPI: Patient is a 66 year old WM with testosterone deficiency, erectile dysfunction and BPH with lower urinary tract symptoms who presents today for 6 month follow-up.  Testosterone deficiency Patient is experiencing a lack of energy, a loss in height, His erections being less strong, deterioration in his ability to play sports and falling asleep after dinner.  This is indicated by his responses to the ADAM questionnaire.  He is no longer having spontaneous erections at night.  He does not have sleep apnea.   He is currently managing his hypogonadism with Clomid 50 mg, 1/2 tablets daily.  His current serum testosterone level is 491 ng/dL on 01/02/2017.  His HCT and LFT's are normal.       Androgen Deficiency in the Aging Male    [place] Name 01/05/17 1100         Androgen Deficiency in the Aging Male   Do you have a decrease in libido (sex drive) No     Do you have lack of energy Yes     Do you have a decrease in strength and/or endurance No     Have you lost height Yes     Have you noticed a decreased "enjoyment of life" No     Are you sad and/or grumpy Yes     Are your erections less strong Yes     Have you noticed a recent deterioration in your ability to play sports Yes     Are you falling asleep after dinner Yes     Has there been a recent deterioration in your work performance No       Erectile dysfunction His SHIM score is 18, which is mild ED.   His previous SHIM score was 20.  He has been having difficulty with erections for the last few years.   His major complaint is still his confidence to keep an erection.  His libido is diminished.   His risk factors for ED are age, BPH, hypogonadism,  spinal stenosis, DM, HTN, HLD and anxiety.   He denies any painful erections or curvatures with his erections.        SHIM    Row Name 01/05/17 1134         SHIM: Over the last 6 months:   How do you rate your confidence that you could get and keep an erection? Moderate     When you had erections with sexual stimulation, how often were your erections hard enough for penetration (entering your partner)? Most Times (much more than half the time)     During sexual intercourse, how often were you able to maintain your erection after you had penetrated (entered) your partner? Most Times (much more than half the time)     During sexual intercourse, how difficult was it to maintain your erection to completion of intercourse? Slightly Difficult     When you attempted sexual intercourse, how often was it satisfactory for you? Sometimes (about half the time)       SHIM Total Score   SHIM 18        Score: 1-7 Severe ED 8-11 Moderate ED 12-16 Mild-Moderate ED 17-21 Mild ED 22-25 No ED  BPH WITH LUTS His IPSS score today is 20, which is severe lower urinary tract symptomatology.  He is mixed with his quality life due to his urinary symptoms. His PVR is 133 mL.  His previous IPSS score was 25/2.  His previous PVR is 133 mL.  His major complaint today frequency, urgency, dysuria, nocturia, incontinence, intermittency, hesitancy, weak urinary stream and straining to urinate.  He has had these symptoms for two to three years.  He denies any dysuria, hematuria or suprapubic pain.   He currently taking tamsulosin 0.4 mg and finasteride 5 mg daily.  He also denies any recent fevers, chills, nausea or vomiting.  He does not have a family history of PCa.      IPSS    Row Name 01/05/17 1100         International Prostate Symptom Score   How often have you had the sensation of not emptying your bladder? About half the time     How often have you had to urinate less than every two hours? About half  the time     How often have you found you stopped and started again several times when you urinated? Less than half the time     How often have you found it difficult to postpone urination? About half the time     How often have you had a weak urinary stream? Less than half the time     How often have you had to strain to start urination? Less than half the time     How many times did you typically get up at night to urinate? 5 Times     Total IPSS Score 20       Quality of Life due to urinary symptoms   If you were to spend the rest of your life with your urinary condition just the way it is now how would you feel about that? Mixed        Score:  1-7 Mild 8-19 Moderate 20-35 Severe     PMH: Past Medical History:  Diagnosis Date  . Adenomatous polyp   . Allergy   . Anxiety   . BPH (benign prostatic hypertrophy)   . Diabetes mellitus   . Diverticulosis   . Hyperlipidemia   . Hypertension   . Rosacea   . Spinal stenosis    disc disease    Surgical History: Past Surgical History:  Procedure Laterality Date  . ASD REPAIR    . L5-S1 Diskectomy  7/13   Dr Arnoldo Morale  . SPINAL CORD DECOMPRESSION      Home Medications:  Allergies as of 01/05/2017   No Known Allergies     Medication List       Accurate as of 01/05/17 11:50 AM. Always use your most recent med list.          aspirin 81 MG tablet Take 81 mg by mouth daily.   Aspirin-Calcium Carbonate 81-777 MG Tabs Take by mouth.   B-D ULTRAFINE III SHORT PEN 31G X 8 MM Misc Generic drug:  Insulin Pen Needle USE AS DIRECTED   clomiPHENE 50 MG tablet Commonly known as:  CLOMID Take 0.5 tablets (25 mg total) by mouth daily.   esomeprazole 40 MG capsule Commonly known as:  NEXIUM TAKE ONE CAPSULE DAILY   finasteride 5 MG tablet Commonly known as:  PROSCAR Take 1 tablet (5 mg total) by mouth daily.   fish oil-omega-3 fatty acids 1000 MG capsule Take 1 g  by mouth 2 (two) times daily.   fluticasone 50  MCG/ACT nasal spray Commonly known as:  FLONASE USE 2 SPRAYS IN EACH NOSTRIL            EVERY DAY   gabapentin 300 MG capsule Commonly known as:  NEURONTIN   glucosamine-chondroitin 500-400 MG tablet Take 1 tablet by mouth daily.   HYDROcodone-acetaminophen 10-500 MG tablet Commonly known as:  LORTAB Take 1 tablet by mouth every 6 (six) hours as needed. Reported on 09/28/2015   insulin glargine 100 UNIT/ML injection Commonly known as:  LANTUS Inject 40 Units into the skin at bedtime.   LANTUS SOLOSTAR 100 UNIT/ML Solostar Pen Generic drug:  Insulin Glargine INJECT 60 UNITS SQ NIGHTLY   metFORMIN 1000 MG tablet Commonly known as:  GLUCOPHAGE TAKE ONE TABLET TWICE A DAY   multivitamin capsule Take 1 capsule by mouth daily.   ONE TOUCH ULTRA TEST test strip Generic drug:  glucose blood USE AS DIRECTED   PARoxetine 20 MG tablet Commonly known as:  PAXIL TAKE 1 TABLET EVERY DAY   pyridOXINE 100 MG tablet Commonly known as:  VITAMIN B-6 Take 100 mg by mouth daily.   ramipril 5 MG capsule Commonly known as:  ALTACE TAKE ONE CAPSULE DAILY   Selenium 100 MCG Caps Take by mouth as needed.   simvastatin 40 MG tablet Commonly known as:  ZOCOR TAKE 1 TABLET EVERY DAY   sitaGLIPtin 50 MG tablet Commonly known as:  JANUVIA Take by mouth.   JANUVIA 50 MG tablet Generic drug:  sitaGLIPtin TAKE 1 TABLET EVERY DAY   tamsulosin 0.4 MG Caps capsule Commonly known as:  FLOMAX Take 1 capsule (0.4 mg total) by mouth daily.   vitamin C 500 MG tablet Commonly known as:  ASCORBIC ACID Take 500 mg by mouth daily.   zinc gluconate 50 MG tablet Take 50 mg by mouth daily.       Allergies: No Known Allergies  Family History: Family History  Problem Relation Age of Onset  . Diabetes Father   . Urolithiasis Father   . Alcohol abuse Paternal Aunt   . Kidney disease Cousin   . Kidney cancer Neg Hx   . Prostate cancer Neg Hx   . Bladder Cancer Neg Hx     Social  History:  reports that he quit smoking about 9 years ago. His smoking use included Cigars. He has never used smokeless tobacco. He reports that he drinks alcohol. He reports that he does not use drugs.  ROS: UROLOGY Frequent Urination?: Yes Hard to postpone urination?: Yes Burning/pain with urination?: Yes Get up at night to urinate?: Yes Leakage of urine?: Yes Urine stream starts and stops?: Yes Trouble starting stream?: Yes Do you have to strain to urinate?: Yes Blood in urine?: No Urinary tract infection?: No Sexually transmitted disease?: No Injury to kidneys or bladder?: No Painful intercourse?: No Weak stream?: Yes Erection problems?: No Penile pain?: No  Gastrointestinal Nausea?: No Vomiting?: No Indigestion/heartburn?: No Diarrhea?: No Constipation?: Yes  Constitutional Fever: No Night sweats?: No Weight loss?: No Fatigue?: No  Skin Skin rash/lesions?: No Itching?: No  Eyes Blurred vision?: No Double vision?: No  Ears/Nose/Throat Sore throat?: No Sinus problems?: No  Hematologic/Lymphatic Swollen glands?: No Easy bruising?: No  Cardiovascular Leg swelling?: No Chest pain?: No  Respiratory Cough?: No Shortness of breath?: No  Endocrine Excessive thirst?: No  Musculoskeletal Back pain?: Yes Joint pain?: Yes  Neurological Headaches?: No Dizziness?: No  Psychologic Depression?: No Anxiety?:  No  Physical Exam: BP (!) 144/78   Pulse (!) 57   Ht 5' (1.524 m)   Wt 194 lb 9.6 oz (88.3 kg)   BMI 38.01 kg/m   Constitutional: Well nourished. Alert and oriented, No acute distress. HEENT: [place] AT, moist mucus membranes. Trachea midline, no masses. Cardiovascular: No clubbing, cyanosis, or edema. Respiratory: Normal respiratory effort, no increased work of breathing. GI: Abdomen is soft, non tender, non distended, no abdominal masses. Liver and spleen not palpable.  No hernias appreciated.  Stool sample for occult testing is not indicated.    GU: No CVA tenderness.  No bladder fullness or masses.  Patient with circumcised phallus.  Urethral meatus is patent.  No penile discharge. No penile lesions or rashes. Scrotum without lesions, cysts, rashes and/or edema.  Testicles are located scrotally bilaterally. No masses are appreciated in the testicles. Left and right epididymis are normal. Rectal: Patient with  normal sphincter tone. Anus and perineum without scarring or rashes. No rectal masses are appreciated. Prostate is approximately 50 grams, no nodules are appreciated. Seminal vesicles are normal. Skin: No rashes, bruises or suspicious lesions. Lymph: No cervical or inguinal adenopathy. Neurologic: Grossly intact, no focal deficits, moving all 4 extremities. Psychiatric: Normal mood and affect.  Laboratory Data: PSA  0.9 ng/mL on 01/02/2017 PSA 1.0 ng/mL on 07/05/2016 Lab Results  Component Value Date   PSA 1.48 04/18/2011   PSA 1.24 03/08/2010   PSA 1.18 03/18/2009    Lab Results  Component Value Date   TESTOSTERONE 491 01/02/2017    Lab Results  Component Value Date   AST 19 01/02/2017   Lab Results  Component Value Date   ALT 26 01/02/2017   TSH  1.530  02/2016  I have reviewed the labs.  Pertinent Imaging: Results for MAYUR, DUMAN (MRN [id number]) as of 01/05/2017 11:43  Ref. Range 01/05/2017 11:36  Scan Result Unknown [id number]    Assessment & Plan:    1. Testosterone deficiency  -most recent testosterone level is 491 ng/dL on 01/02/2017  -continue Clomid 50 mg, 1/2 tablet daily; refills given  -RTC in 6 months for HCT, testosterone, PSA, LFT's, ADAM and exam  2. BPH with LUTS  - IPSS score is 20/3 , it is improving  - Continue conservative management, avoiding bladder irritants and timed voiding's  - Continue tamsulosin 0.4 mg daily and finasteride 5 mg daily, refills given  - offered a work up for a bladder outlet procedure, patient deferred at this time  - RTC in 6 months for IPSS, PSA,  PVR and exam, as testosterone therapy can cause prostate enlargement and worsen LUTS  3. Erectile dysfunction:     -SHIM score is 18 it is worsening  -patient not sexually active with his wife at this time, masturbates only  -RTC in 6 months for SHIM score and exam, as testosterone therapy can affect erections  Return in about 6 months (around 07/06/2017) for LFTs, PSA,Testoterone and  HCT/HBG, ADAM, IPSS, SHIM and exam.  These notes generated with voice recognition software. I apologize for typographical errors.  Zara Council, [facility] [address], [place],  [address] [phone]

## 2017-01-05 ENCOUNTER — Ambulatory Visit (INDEPENDENT_AMBULATORY_CARE_PROVIDER_SITE_OTHER): Payer: Medicare HMO | Admitting: Urology

## 2017-01-05 ENCOUNTER — Encounter: Payer: Self-pay | Admitting: Urology

## 2017-01-05 VITALS — BP 144/78 | HR 57 | Ht 60.0 in | Wt 194.6 lb

## 2017-01-05 DIAGNOSIS — E291 Testicular hypofunction: Secondary | ICD-10-CM

## 2017-01-05 DIAGNOSIS — N138 Other obstructive and reflux uropathy: Secondary | ICD-10-CM

## 2017-01-05 DIAGNOSIS — N401 Enlarged prostate with lower urinary tract symptoms: Secondary | ICD-10-CM

## 2017-01-05 DIAGNOSIS — N529 Male erectile dysfunction, unspecified: Secondary | ICD-10-CM | POA: Diagnosis not present

## 2017-01-05 DIAGNOSIS — E349 Endocrine disorder, unspecified: Secondary | ICD-10-CM | POA: Diagnosis not present

## 2017-01-05 LAB — BLADDER SCAN AMB NON-IMAGING: Scan Result: 138

## 2017-01-05 MED ORDER — CLOMIPHENE CITRATE 50 MG PO TABS: 25.0000 mg | ORAL_TABLET | Freq: Every day | ORAL | 3 refills | Status: DC

## 2017-01-05 MED ORDER — FINASTERIDE 5 MG PO TABS: 5.0000 mg | ORAL_TABLET | Freq: Every day | ORAL | 3 refills | Status: DC

## 2017-01-05 MED ORDER — TAMSULOSIN HCL 0.4 MG PO CAPS: 0.4000 mg | ORAL_CAPSULE | Freq: Every day | ORAL | 3 refills | Status: DC

## 2017-01-12 DIAGNOSIS — E119 Type 2 diabetes mellitus without complications: Secondary | ICD-10-CM | POA: Diagnosis not present

## 2017-01-13 DIAGNOSIS — M5416 Radiculopathy, lumbar region: Secondary | ICD-10-CM | POA: Diagnosis not present

## 2017-01-13 DIAGNOSIS — M5136 Other intervertebral disc degeneration, lumbar region: Secondary | ICD-10-CM | POA: Diagnosis not present

## 2017-01-19 DIAGNOSIS — Z9189 Other specified personal risk factors, not elsewhere classified: Secondary | ICD-10-CM | POA: Diagnosis not present

## 2017-01-19 DIAGNOSIS — R69 Illness, unspecified: Secondary | ICD-10-CM | POA: Diagnosis not present

## 2017-01-19 DIAGNOSIS — R7989 Other specified abnormal findings of blood chemistry: Secondary | ICD-10-CM | POA: Insufficient documentation

## 2017-01-19 DIAGNOSIS — Z8619 Personal history of other infectious and parasitic diseases: Secondary | ICD-10-CM | POA: Diagnosis not present

## 2017-01-19 DIAGNOSIS — M5416 Radiculopathy, lumbar region: Secondary | ICD-10-CM | POA: Diagnosis not present

## 2017-01-19 DIAGNOSIS — E119 Type 2 diabetes mellitus without complications: Secondary | ICD-10-CM | POA: Diagnosis not present

## 2017-01-19 DIAGNOSIS — Z8659 Personal history of other mental and behavioral disorders: Secondary | ICD-10-CM | POA: Insufficient documentation

## 2017-01-19 DIAGNOSIS — Z23 Encounter for immunization: Secondary | ICD-10-CM | POA: Diagnosis not present

## 2017-01-19 DIAGNOSIS — I10 Essential (primary) hypertension: Secondary | ICD-10-CM | POA: Diagnosis not present

## 2017-01-19 DIAGNOSIS — E7800 Pure hypercholesterolemia, unspecified: Secondary | ICD-10-CM | POA: Diagnosis not present

## 2017-02-27 DIAGNOSIS — R69 Illness, unspecified: Secondary | ICD-10-CM | POA: Diagnosis not present

## 2017-03-15 DIAGNOSIS — M5136 Other intervertebral disc degeneration, lumbar region: Secondary | ICD-10-CM | POA: Diagnosis not present

## 2017-03-15 DIAGNOSIS — M1612 Unilateral primary osteoarthritis, left hip: Secondary | ICD-10-CM | POA: Diagnosis not present

## 2017-03-15 DIAGNOSIS — M5416 Radiculopathy, lumbar region: Secondary | ICD-10-CM | POA: Diagnosis not present

## 2017-03-16 DIAGNOSIS — M1612 Unilateral primary osteoarthritis, left hip: Secondary | ICD-10-CM | POA: Diagnosis not present

## 2017-04-10 DIAGNOSIS — R69 Illness, unspecified: Secondary | ICD-10-CM | POA: Diagnosis not present

## 2017-04-12 DIAGNOSIS — E119 Type 2 diabetes mellitus without complications: Secondary | ICD-10-CM | POA: Diagnosis not present

## 2017-04-19 DIAGNOSIS — E7800 Pure hypercholesterolemia, unspecified: Secondary | ICD-10-CM | POA: Diagnosis not present

## 2017-04-19 DIAGNOSIS — E119 Type 2 diabetes mellitus without complications: Secondary | ICD-10-CM | POA: Diagnosis not present

## 2017-04-19 DIAGNOSIS — I10 Essential (primary) hypertension: Secondary | ICD-10-CM | POA: Diagnosis not present

## 2017-04-20 DIAGNOSIS — H43813 Vitreous degeneration, bilateral: Secondary | ICD-10-CM | POA: Diagnosis not present

## 2017-04-20 DIAGNOSIS — E119 Type 2 diabetes mellitus without complications: Secondary | ICD-10-CM | POA: Diagnosis not present

## 2017-05-17 DIAGNOSIS — R69 Illness, unspecified: Secondary | ICD-10-CM | POA: Diagnosis not present

## 2017-05-23 ENCOUNTER — Other Ambulatory Visit: Payer: Self-pay | Admitting: Urology

## 2017-05-23 DIAGNOSIS — E291 Testicular hypofunction: Secondary | ICD-10-CM

## 2017-06-08 IMAGING — MR MR HEAD WO/W CM
10 of 18 series · 35 of 48 positions shown · IV contrast (multihance)
Comparison: None.

CLINICAL DATA: Elevated prolactin.

EXAM:
MRI HEAD WITHOUT AND WITH CONTRAST
TECHNIQUE: Multiplanar, multiecho pulse sequences of the brain and surrounding
structures were obtained without and with intravenous contrast.
CONTRAST:  9mL MULTIHANCE GADOBENATE DIMEGLUMINE 529 MG/ML IV SOLN

[Series 2: T1 · sagittal · 5.0mm · 0.62mm/px · 4 of 29 slices shown (1 of 2)]
[im 1/29]
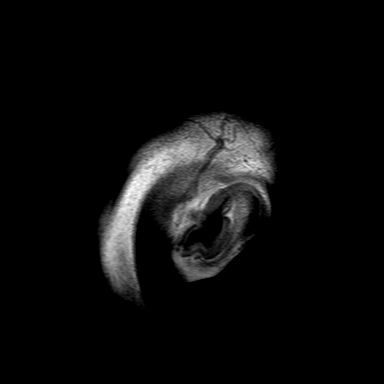
[im 10/29]
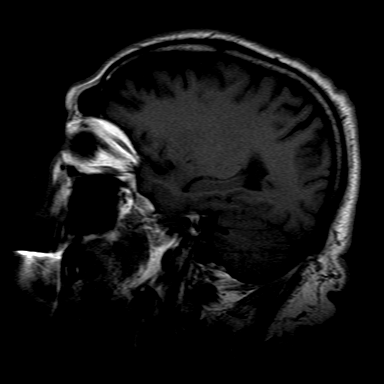
[im 19/29]
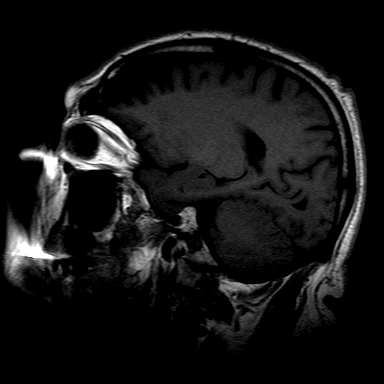
[im 29/29]
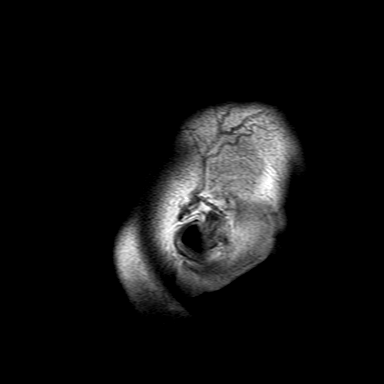

[Series 4: DWI · axial · 4.0mm · 0.94mm/px · z∈[-56,+97]mm · 6 of 42 slices shown]
[im 1/42]
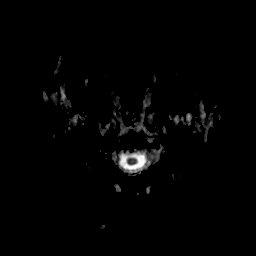
[im 9/42]
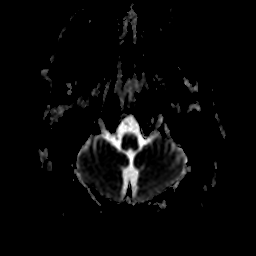
[im 17/42]
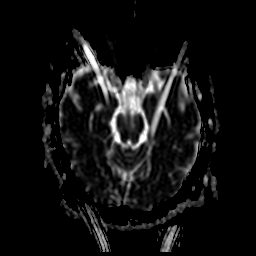
[im 25/42]
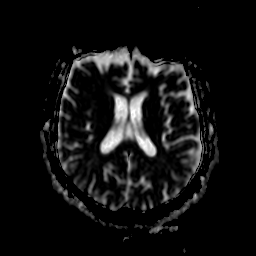
[im 33/42]
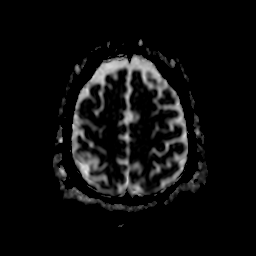
[im 42/42]
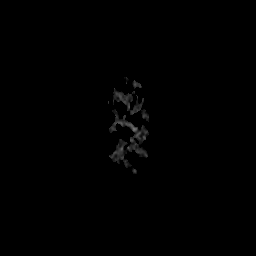

[Series 6: T2 · axial · 5.0mm · 0.60mm/px · z∈[-57,+100]mm · 3 of 25 slices shown (1 of 2)]
[im 1/25]
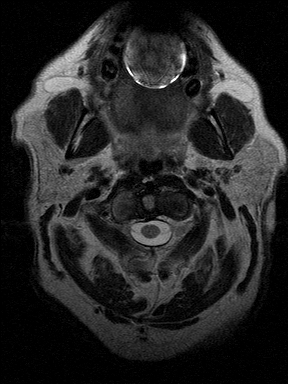
[im 13/25]
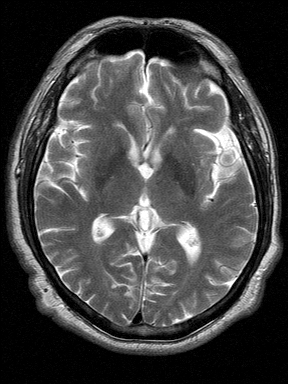
[im 25/25]
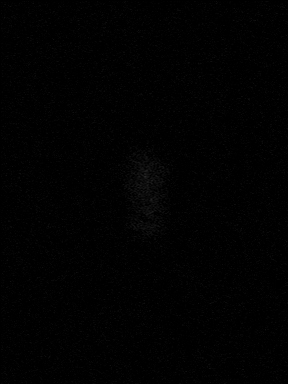

[Series 7: T2 · axial · 5.0mm · 0.60mm/px · z∈[-57,+100]mm · 3 of 25 slices shown (2 of 2)]
[im 1/25]
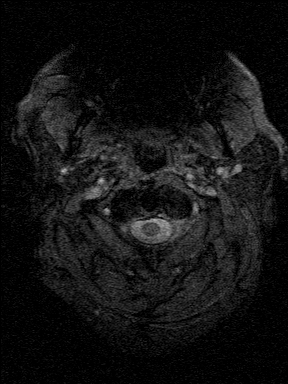
[im 13/25]
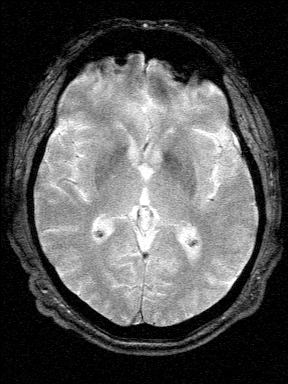
[im 25/25]
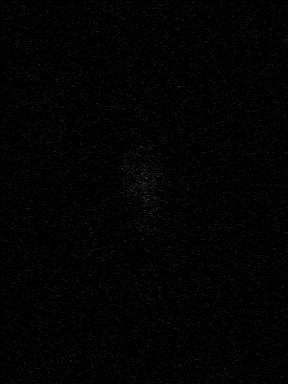

[Series 8: FLAIR · axial · 5.0mm · 0.60mm/px · z∈[-57,+100]mm · 3 of 25 slices shown]
[im 1/25]
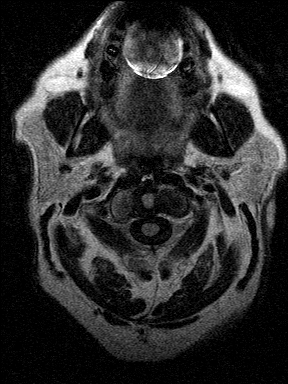
[im 13/25]
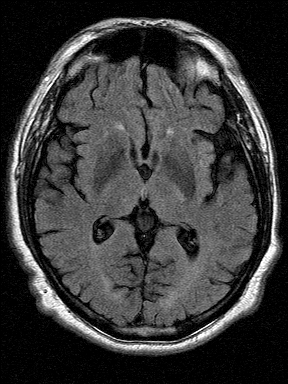
[im 25/25]
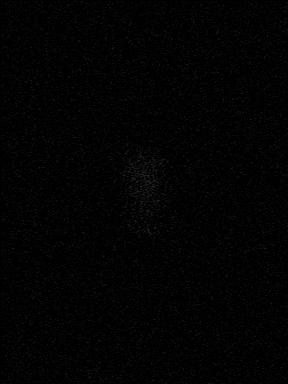

[Series 9: T1 · coronal · 3.0mm · 0.42mm/px · 2 of 15 slices shown (2 of 2)]
[im 1/15]
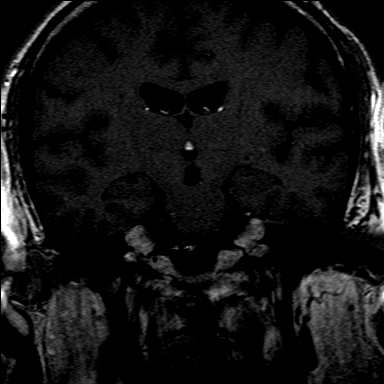
[im 15/15]
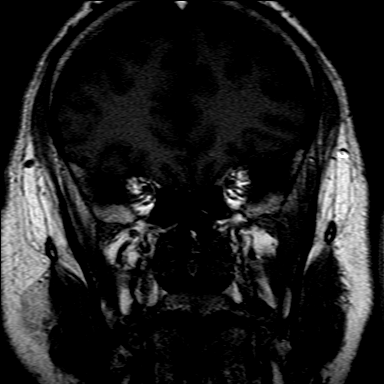

[Series 22: T1 post-contrast · sagittal · 3.0mm · 0.42mm/px · 2 of 19 slices shown (1 of 4)]
[im 1/19]
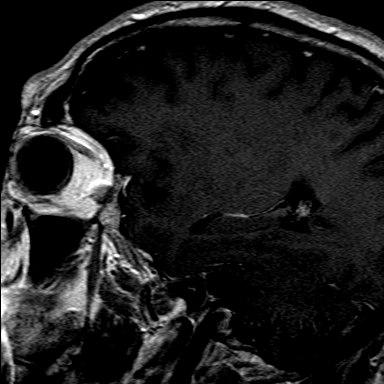
[im 19/19]
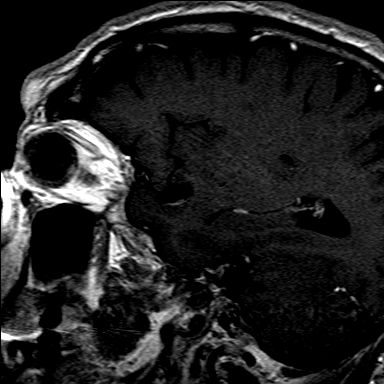

[Series 23: T1 post-contrast · coronal · 3.0mm · 0.42mm/px · 2 of 15 slices shown (2 of 4)]
[im 1/15]
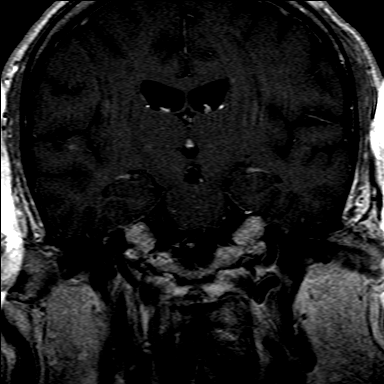
[im 15/15]
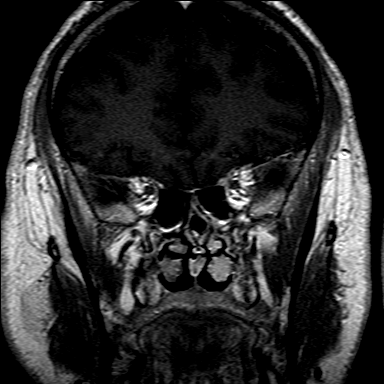

[Series 24: T1 post-contrast · axial · 3.0mm · 0.43mm/px · z∈[-63,+102]mm · 7 of 60 slices shown (3 of 4)]
[im 1/60]
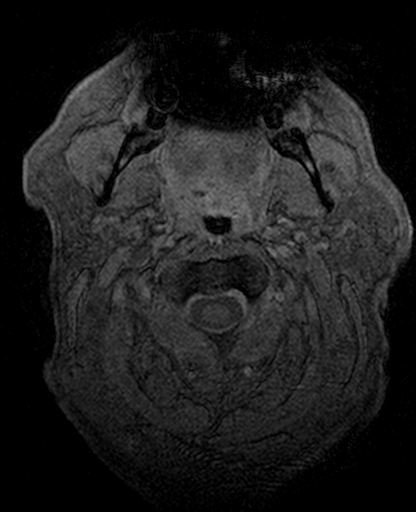
[im 10/60]
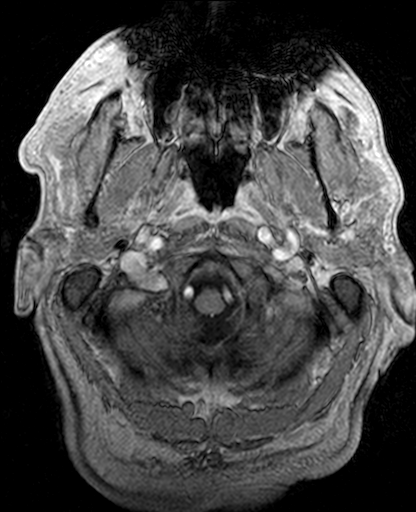
[im 20/60]
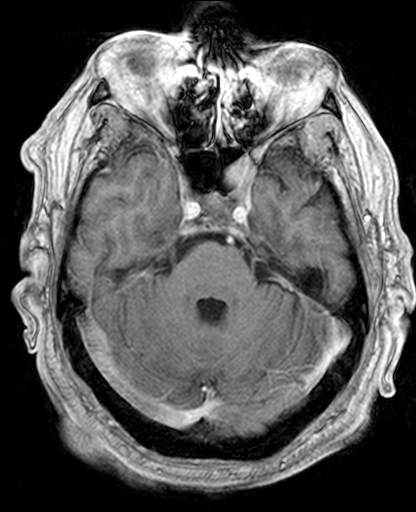
[im 30/60]
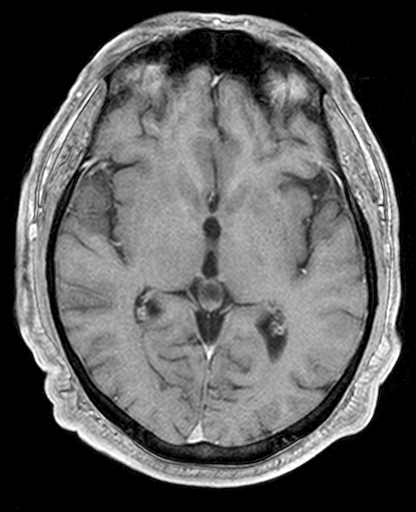
[im 40/60]
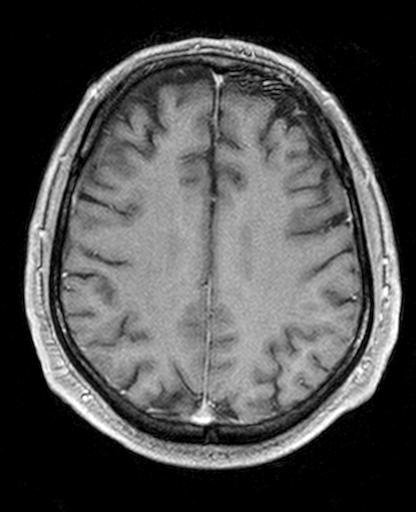
[im 50/60]
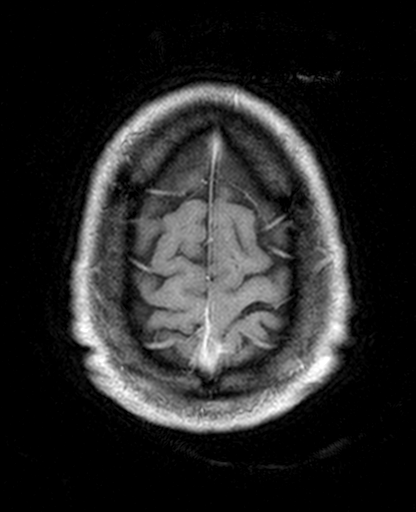
[im 60/60]
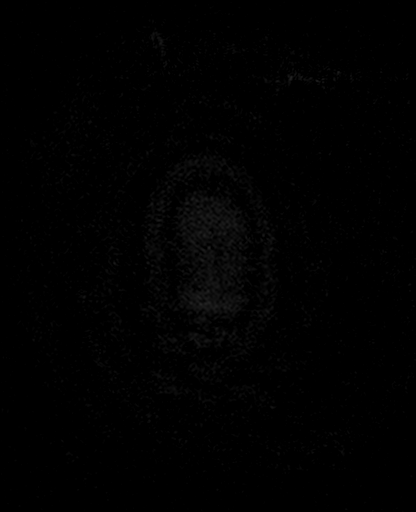

[Series 25: T1 post-contrast · coronal · 5.0mm · 0.43mm/px · 3 of 29 slices shown (4 of 4)]
[im 1/29]
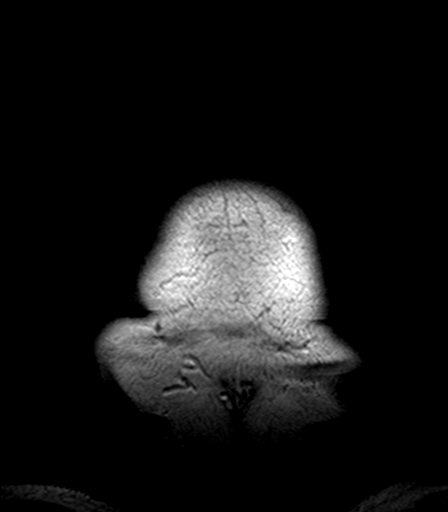
[im 15/29]
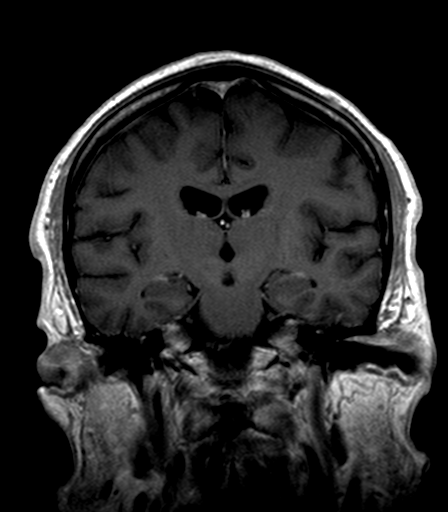
[im 29/29]
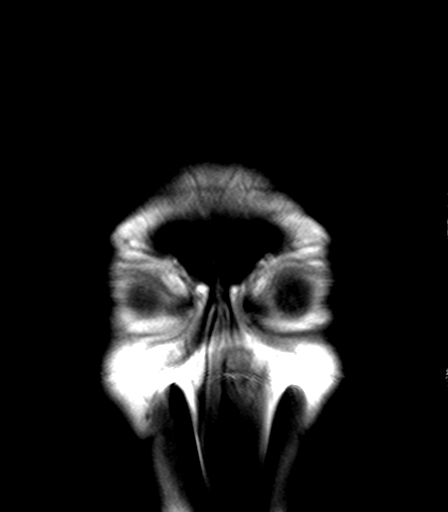

[35 of 48 positions shown; findings below may reference images not displayed]

FINDINGS: The brain itself shows mild generalized volume loss. No evidence of
small or large vessel infarction, mass lesion, hemorrhage,
hydrocephalus or extra-axial collection.

The pituitary gland shows normal size. The upper surface is concave.
The infundibulum is midline. The gland enhances homogeneously and
normally.

There is a retention cyst in the left division of the sphenoid sinus
that should not be of any clinical significance. Paranasal sinuses
are otherwise clear.
IMPRESSION: No brain abnormality.

No pituitary abnormality.

## 2017-06-12 DIAGNOSIS — Z794 Long term (current) use of insulin: Secondary | ICD-10-CM | POA: Diagnosis not present

## 2017-06-12 DIAGNOSIS — E1165 Type 2 diabetes mellitus with hyperglycemia: Secondary | ICD-10-CM | POA: Diagnosis not present

## 2017-06-14 DIAGNOSIS — M5416 Radiculopathy, lumbar region: Secondary | ICD-10-CM | POA: Diagnosis not present

## 2017-06-14 DIAGNOSIS — M5136 Other intervertebral disc degeneration, lumbar region: Secondary | ICD-10-CM | POA: Diagnosis not present

## 2017-06-26 DIAGNOSIS — L814 Other melanin hyperpigmentation: Secondary | ICD-10-CM | POA: Diagnosis not present

## 2017-06-26 DIAGNOSIS — Z1283 Encounter for screening for malignant neoplasm of skin: Secondary | ICD-10-CM | POA: Diagnosis not present

## 2017-06-26 DIAGNOSIS — D229 Melanocytic nevi, unspecified: Secondary | ICD-10-CM | POA: Diagnosis not present

## 2017-06-26 DIAGNOSIS — L578 Other skin changes due to chronic exposure to nonionizing radiation: Secondary | ICD-10-CM | POA: Diagnosis not present

## 2017-06-26 DIAGNOSIS — L821 Other seborrheic keratosis: Secondary | ICD-10-CM | POA: Diagnosis not present

## 2017-06-26 DIAGNOSIS — L718 Other rosacea: Secondary | ICD-10-CM | POA: Diagnosis not present

## 2017-06-26 DIAGNOSIS — L853 Xerosis cutis: Secondary | ICD-10-CM | POA: Diagnosis not present

## 2017-06-29 DIAGNOSIS — M48062 Spinal stenosis, lumbar region with neurogenic claudication: Secondary | ICD-10-CM | POA: Diagnosis not present

## 2017-06-29 DIAGNOSIS — M5136 Other intervertebral disc degeneration, lumbar region: Secondary | ICD-10-CM | POA: Diagnosis not present

## 2017-06-29 DIAGNOSIS — M5416 Radiculopathy, lumbar region: Secondary | ICD-10-CM | POA: Diagnosis not present

## 2017-06-30 DIAGNOSIS — R69 Illness, unspecified: Secondary | ICD-10-CM | POA: Diagnosis not present

## 2017-07-04 ENCOUNTER — Other Ambulatory Visit: Payer: Self-pay

## 2017-07-04 ENCOUNTER — Other Ambulatory Visit: Payer: Medicare HMO

## 2017-07-04 DIAGNOSIS — E349 Endocrine disorder, unspecified: Secondary | ICD-10-CM

## 2017-07-04 DIAGNOSIS — N529 Male erectile dysfunction, unspecified: Secondary | ICD-10-CM | POA: Diagnosis not present

## 2017-07-04 DIAGNOSIS — N401 Enlarged prostate with lower urinary tract symptoms: Secondary | ICD-10-CM | POA: Diagnosis not present

## 2017-07-04 DIAGNOSIS — N138 Other obstructive and reflux uropathy: Secondary | ICD-10-CM | POA: Diagnosis not present

## 2017-07-05 LAB — HEPATIC FUNCTION PANEL
ALT: 29 IU/L (ref 0–44)
AST: 17 IU/L (ref 0–40)
Albumin: 4.5 g/dL (ref 3.6–4.8)
Alkaline Phosphatase: 27 IU/L — ABNORMAL LOW (ref 39–117)
Bilirubin Total: 0.4 mg/dL (ref 0.0–1.2)
Bilirubin, Direct: 0.11 mg/dL (ref 0.00–0.40)
Total Protein: 6.5 g/dL (ref 6.0–8.5)

## 2017-07-05 LAB — HEMOGLOBIN: Hemoglobin: 15.3 g/dL (ref 13.0–17.7)

## 2017-07-05 LAB — PSA: Prostate Specific Ag, Serum: 0.9 ng/mL (ref 0.0–4.0)

## 2017-07-05 LAB — TESTOSTERONE: Testosterone: 440 ng/dL (ref 264–916)

## 2017-07-05 LAB — HEMATOCRIT: Hematocrit: 44.3 % (ref 37.5–51.0)

## 2017-07-05 NOTE — Progress Notes (Signed)
07/06/2017 11:11 AM   Jacob Perkins. 04/10/1950 [id number]  Referring provider: Baxter Hire, MD Jacob Perkins, Jacob Perkins [id number]  Chief Complaint  Patient presents with  . Hypogonadism    80month    HPI: Patient is a 67 year old WM with testosterone deficiency, erectile dysfunction and BPH with lower urinary tract symptoms who presents today for 6 month follow-up.  Testosterone deficiency He is no longer having spontaneous erections at night.  He does not have sleep apnea.   He is currently managing his hypogonadism with Clomid 50 mg, 1 tablets daily.  His current serum testosterone level is 440 ng/dL on 07/04/2017.  His HCT and LFT's are normal.    Erectile dysfunction His SHIM score is 20, which is mild ED.   His previous SHIM score was 18.  He has been having difficulty with erections for the last few years.   His major complaint is still his confidence to keep an erection.  His libido is diminished.   His risk factors for ED are age, BPH, testosterone deficiency, spinal stenosis, DM, HTN, HLD and anxiety.   He denies any painful erections or curvatures with his erections.    SHIM    Row Name 07/06/17 1053         SHIM: Over the last 6 months:   How do you rate your confidence that you could get and keep an erection?  Moderate     When you had erections with sexual stimulation, how often were your erections hard enough for penetration (entering your partner)?  Most Times (much more than half the time)     During sexual intercourse, how often were you able to maintain your erection after you had penetrated (entered) your partner?  Most Times (much more than half the time)     During sexual intercourse, how difficult was it to maintain your erection to completion of intercourse?  Not Difficult     When you attempted sexual intercourse, how often was it satisfactory for you?  Most Times (much more than half the time)       SHIM Total Score   SHIM  20          Score: 1-7 Severe ED 8-11 Moderate ED 12-16 Mild-Moderate ED 17-21 Mild ED 22-25 No ED   BPH WITH LUTS His IPSS score today is 28, which is severe lower urinary tract symptomatology.  He is mostly satisfied with his quality life due to his urinary symptoms. His previous IPSS score was 20/3.  His previous PVR is 133 mL.  His major complaint today frequency, urgency, nocturia, incontinence, intermittency and weak urinary stream and straining to urinate.  He has had these symptoms for two to three years.  He denies any dysuria, hematuria or suprapubic pain.   He currently taking tamsulosin 0.4 mg and finasteride 5 mg daily.  He also denies any recent fevers, chills, nausea or vomiting.  He does not have a family history of PCa.  IPSS    Row Name 07/06/17 1000         International Prostate Symptom Score   How often have you had the sensation of not emptying your bladder?  More than half the time     How often have you had to urinate less than every two hours?  Almost always     How often have you found you stopped and started again several times when you urinated?  More than half the time  How often have you found it difficult to postpone urination?  More than half the time     How often have you had a weak urinary stream?  More than half the time     How often have you had to strain to start urination?  More than half the time     How many times did you typically get up at night to urinate?  3 Times     Total IPSS Score  28       Quality of Life due to urinary symptoms   If you were to spend the rest of your life with your urinary condition just the way it is now how would you feel about that?  Mostly Satisfied        Score:  1-7 Mild 8-19 Moderate 20-35 Severe   PMH: Past Medical History:  Diagnosis Date  . Adenomatous polyp   . Allergy   . Anxiety   . BPH (benign prostatic hypertrophy)   . Diabetes mellitus   . Diverticulosis   . Hyperlipidemia   .  Hypertension   . Rosacea   . Spinal stenosis    disc disease    Surgical History: Past Surgical History:  Procedure Laterality Date  . ASD REPAIR    . L5-S1 Diskectomy  7/13   Dr Jacob Perkins  . SPINAL CORD DECOMPRESSION      Home Medications:  Allergies as of 07/06/2017   No Known Allergies     Medication List        Accurate as of 07/06/17 11:11 AM. Always use your most recent med list.          aspirin 81 MG tablet Take 81 mg by mouth daily.   Aspirin-Calcium Carbonate 81-777 MG Tabs Take by mouth.   B-D ULTRAFINE III SHORT PEN 31G X 8 MM Misc Generic drug:  Insulin Pen Needle USE AS DIRECTED   clomiPHENE 50 MG tablet Commonly known as:  CLOMID TAKE 1/2 TABLET BY MOUTH ONCE DAILY AS DIRECTED   esomeprazole 40 MG capsule Commonly known as:  NEXIUM TAKE ONE CAPSULE DAILY   finasteride 5 MG tablet Commonly known as:  PROSCAR Take 1 tablet (5 mg total) by mouth daily.   fish oil-omega-3 fatty acids 1000 MG capsule Take 1 g by mouth 2 (two) times daily.   fluticasone 50 MCG/ACT nasal spray Commonly known as:  FLONASE USE 2 SPRAYS IN EACH NOSTRIL            EVERY DAY   gabapentin 300 MG capsule Commonly known as:  NEURONTIN   glucosamine-chondroitin 500-400 MG tablet Take 1 tablet by mouth daily.   insulin glargine 100 UNIT/ML injection Commonly known as:  LANTUS Inject 40 Units into the skin at bedtime.   LANTUS SOLOSTAR 100 UNIT/ML Solostar Pen Generic drug:  Insulin Glargine INJECT 60 UNITS SQ NIGHTLY   metFORMIN 1000 MG tablet Commonly known as:  GLUCOPHAGE TAKE ONE TABLET TWICE A DAY   multivitamin capsule Take 1 capsule by mouth daily.   ONE TOUCH ULTRA TEST test strip Generic drug:  glucose blood USE AS DIRECTED   PARoxetine 20 MG tablet Commonly known as:  PAXIL TAKE 1 TABLET EVERY DAY   pyridOXINE 100 MG tablet Commonly known as:  VITAMIN B-6 Take 100 mg by mouth daily.   ramipril 5 MG capsule Commonly known as:  ALTACE TAKE  ONE CAPSULE DAILY   Selenium 100 MCG Caps Take by mouth as needed.   simvastatin  40 MG tablet Commonly known as:  ZOCOR TAKE 1 TABLET EVERY DAY   sitaGLIPtin 50 MG tablet Commonly known as:  JANUVIA Take by mouth.   JANUVIA 50 MG tablet Generic drug:  sitaGLIPtin TAKE 1 TABLET EVERY DAY   tamsulosin 0.4 MG Caps capsule Commonly known as:  FLOMAX Take 1 capsule (0.4 mg total) by mouth daily.   TRULICITY 1.5 DZ/3.2DJ Sopn Generic drug:  Dulaglutide Inject into the skin.   vitamin C 500 MG tablet Commonly known as:  ASCORBIC ACID Take 500 mg by mouth daily.   zinc gluconate 50 MG tablet Take 50 mg by mouth daily.       Allergies: No Known Allergies  Family History: Family History  Problem Relation Age of Onset  . Diabetes Father   . Urolithiasis Father   . Alcohol abuse Paternal Aunt   . Kidney disease Cousin   . Kidney cancer Neg Hx   . Prostate cancer Neg Hx   . Bladder Cancer Neg Hx     Social History:  reports that he quit smoking about 10 years ago. His smoking use included cigars. He has never used smokeless tobacco. He reports that he drinks alcohol. He reports that he does not use drugs.  ROS: UROLOGY Frequent Urination?: Yes Hard to postpone urination?: Yes Burning/pain with urination?: No Get up at night to urinate?: Yes Leakage of urine?: Yes Urine stream starts and stops?: Yes Trouble starting stream?: No Do you have to strain to urinate?: Yes Blood in urine?: No Urinary tract infection?: No Sexually transmitted disease?: No Injury to kidneys or bladder?: No Painful intercourse?: No Weak stream?: Yes Erection problems?: No Penile pain?: No  Gastrointestinal Nausea?: No Vomiting?: No Indigestion/heartburn?: No Diarrhea?: No Constipation?: No  Constitutional Fever: No Night sweats?: No Weight loss?: No Fatigue?: No  Skin Skin rash/lesions?: No Itching?: No  Eyes Blurred vision?: No Double vision?:  No  Ears/Nose/Throat Sore throat?: No Sinus problems?: No  Hematologic/Lymphatic Swollen glands?: No Easy bruising?: No  Cardiovascular Leg swelling?: No Chest pain?: No  Respiratory Cough?: No Shortness of breath?: No  Endocrine Excessive thirst?: No  Musculoskeletal Back pain?: Yes Joint pain?: Yes  Neurological Headaches?: No Dizziness?: No  Psychologic Depression?: No Anxiety?: No  Physical Exam: BP 128/62   Pulse (!) 55   Ht 5\' 5"  (1.651 m)   Wt 178 lb (80.7 kg)   BMI 29.62 kg/m   Constitutional: Well nourished. Alert and oriented, No acute distress. HEENT: [place] AT, moist mucus membranes. Trachea midline, no masses. Cardiovascular: No clubbing, cyanosis, or edema. Respiratory: Normal respiratory effort, no increased work of breathing. GI: Abdomen is soft, non tender, non distended, no abdominal masses. Liver and spleen not palpable.  No hernias appreciated.  Stool sample for occult testing is not indicated.   GU: No CVA tenderness.  No bladder fullness or masses.  Patient with circumcised phallus.   Urethral meatus is patent.  No penile discharge. No penile lesions or rashes. Scrotum without lesions, cysts, rashes and/or edema.  Testicles are located scrotally bilaterally. No masses are appreciated in the testicles. Left and right epididymis are normal. Rectal: Patient with  normal sphincter tone. Anus and perineum without scarring or rashes. No rectal masses are appreciated. Prostate is approximately 50 grams, no nodules are appreciated. Seminal vesicles are normal. Skin: No rashes, bruises or suspicious lesions. Lymph: No cervical or inguinal adenopathy. Neurologic: Grossly intact, no focal deficits, moving all 4 extremities. Psychiatric: Normal mood and affect.   Laboratory  Data: PSA  0.9 ng/mL on 01/02/2017 PSA 1.0 ng/mL on 07/05/2016 PSA 0.9 ng/mL on 07/04/2017 Lab Results  Component Value Date   PSA 1.48 04/18/2011   PSA 1.24 03/08/2010   PSA  1.18 03/18/2009    Lab Results  Component Value Date   TESTOSTERONE 440 07/04/2017    Lab Results  Component Value Date   AST 17 07/04/2017   Lab Results  Component Value Date   ALT 29 07/04/2017   TSH  1.530  02/2016  I have reviewed the labs.  Assessment & Plan:    1. Testosterone deficiency  -most recent testosterone level is 440 ng/dL on 07/04/2017  -continue Clomid 50 mg, 1 tablet daily  -RTC in 6 months for HCT, testosterone, PSA, LFT's, ADAM and exam  2. BPH with LUTS  - IPSS score is 28/2 , it is worsening  - Continue conservative management, avoiding bladder irritants and timed voiding's  - most bothersome symptoms are urgency and urge incontinence  - Continue tamsulosin 0.4 mg daily and finasteride 5 mg daily  - offered a work up for a bladder outlet procedure, would like to have a cystoscopy at this time  - RTC in 6 months for IPSS, PSA, PVR and exam, as testosterone therapy can cause prostate enlargement and worsen LUTS  3. Erectile dysfunction:     -SHIM score is 20 it is improving  -patient not sexually active with his wife at this time, masturbates only  -RTC in 6 months for SHIM score and exam, as testosterone therapy can affect erections  Return for cystoscopy to evaluate for BOO.  These notes generated with voice recognition software. I apologize for typographical errors.  Zara Council, PA-C  [facility] [address] [place], [place] [address] [phone]

## 2017-07-06 ENCOUNTER — Encounter: Payer: Self-pay | Admitting: Urology

## 2017-07-06 ENCOUNTER — Ambulatory Visit: Payer: Medicare HMO | Admitting: Urology

## 2017-07-06 VITALS — BP 128/62 | HR 55 | Ht 65.0 in | Wt 178.0 lb

## 2017-07-06 DIAGNOSIS — N529 Male erectile dysfunction, unspecified: Secondary | ICD-10-CM

## 2017-07-06 DIAGNOSIS — N401 Enlarged prostate with lower urinary tract symptoms: Secondary | ICD-10-CM | POA: Diagnosis not present

## 2017-07-06 DIAGNOSIS — N138 Other obstructive and reflux uropathy: Secondary | ICD-10-CM

## 2017-07-06 DIAGNOSIS — E349 Endocrine disorder, unspecified: Secondary | ICD-10-CM | POA: Diagnosis not present

## 2017-07-06 NOTE — Patient Instructions (Signed)
Cystoscopy  Cystoscopy is a procedure that is used to help diagnose and sometimes treat conditions that affect that lower urinary tract. The lower urinary tract includes the bladder and the tube that drains urine from the bladder out of the body (urethra). Cystoscopy is performed with a thin, tube-shaped instrument with a light and camera at the end (cystoscope). The cystoscope may be hard (rigid) or flexible, depending on the goal of the procedure.The cystoscope is inserted through the urethra, into the bladder.  Cystoscopy may be recommended if you have:   Urinary tractinfections that keep coming back (recurring).   Blood in the urine (hematuria).   Loss of bladder control (urinary incontinence) or an overactive bladder.   Unusual cells found in a urine sample.   A blockage in the urethra.   Painful urination.   An abnormality in the bladder found during an intravenous pyelogram (IVP) or CT scan.    Cystoscopy may also be done to remove a sample of tissue to be examined under a microscope (biopsy).  Tell a health care provider about:   Any allergies you have.   All medicines you are taking, including vitamins, herbs, eye drops, creams, and over-the-counter medicines.   Any problems you or family members have had with anesthetic medicines.   Any blood disorders you have.   Any surgeries you have had.   Any medical conditions you have.   Whether you are pregnant or may be pregnant.  What are the risks?  Generally, this is a safe procedure. However, problems may occur, including:   Infection.   Bleeding.   Allergic reactions to medicines.   Damage to other structures or organs.    What happens before the procedure?   Ask your health care provider about:  ? Changing or stopping your regular medicines. This is especially important if you are taking diabetes medicines or blood thinners.  ? Taking medicines such as aspirin and ibuprofen. These medicines can thin your blood. Do not take these medicines  before your procedure if your health care provider instructs you not to.   Follow instructions from your health care provider about eating or drinking restrictions.   You may be given antibiotic medicine to help prevent infection.   You may have an exam or testing, such as X-rays of the bladder, urethra, or kidneys.   You may have urine tests to check for signs of infection.   Plan to have someone take you home after the procedure.  What happens during the procedure?   To reduce your risk of infection,your health care team will wash or sanitize their hands.   You will be given one or more of the following:  ? A medicine to help you relax (sedative).  ? A medicine to numb the area (local anesthetic).   The area around the opening of your urethra will be cleaned.   The cystoscope will be passed through your urethra into your bladder.   Germ-free (sterile)fluid will flow through the cystoscope to fill your bladder. The fluid will stretch your bladder so that your surgeon can clearly examine your bladder walls.   The cystoscope will be removed and your bladder will be emptied.  The procedure may vary among health care providers and hospitals.  What happens after the procedure?   You may have some soreness or pain in your abdomen and urethra. Medicines will be available to help you.   You may have some blood in your urine.   Do not   drive for 24 hours if you received a sedative.  This information is not intended to replace advice given to you by your health care provider. Make sure you discuss any questions you have with your health care provider.  Document Released: 03/11/2000 Document Revised: 07/23/2015 Document Reviewed: 01/29/2015  Elsevier Interactive Patient Education  2018 [employer]

## 2017-07-11 DIAGNOSIS — E7800 Pure hypercholesterolemia, unspecified: Secondary | ICD-10-CM | POA: Diagnosis not present

## 2017-07-11 DIAGNOSIS — E119 Type 2 diabetes mellitus without complications: Secondary | ICD-10-CM | POA: Diagnosis not present

## 2017-07-18 DIAGNOSIS — Z0001 Encounter for general adult medical examination with abnormal findings: Secondary | ICD-10-CM | POA: Diagnosis not present

## 2017-07-18 DIAGNOSIS — I10 Essential (primary) hypertension: Secondary | ICD-10-CM | POA: Diagnosis not present

## 2017-07-18 DIAGNOSIS — N400 Enlarged prostate without lower urinary tract symptoms: Secondary | ICD-10-CM | POA: Diagnosis not present

## 2017-07-18 DIAGNOSIS — Z0000 Encounter for general adult medical examination without abnormal findings: Secondary | ICD-10-CM | POA: Diagnosis not present

## 2017-07-18 DIAGNOSIS — E119 Type 2 diabetes mellitus without complications: Secondary | ICD-10-CM | POA: Diagnosis not present

## 2017-07-20 ENCOUNTER — Encounter: Payer: Self-pay | Admitting: Urology

## 2017-07-28 ENCOUNTER — Ambulatory Visit: Payer: Medicare HMO | Admitting: Urology

## 2017-07-28 ENCOUNTER — Encounter: Payer: Self-pay | Admitting: Urology

## 2017-07-28 VITALS — BP 131/77 | HR 52 | Ht 65.0 in | Wt 182.1 lb

## 2017-07-28 DIAGNOSIS — N401 Enlarged prostate with lower urinary tract symptoms: Secondary | ICD-10-CM | POA: Diagnosis not present

## 2017-07-28 DIAGNOSIS — N138 Other obstructive and reflux uropathy: Secondary | ICD-10-CM | POA: Diagnosis not present

## 2017-07-28 LAB — URINALYSIS, COMPLETE
Bilirubin, UA: NEGATIVE
Glucose, UA: NEGATIVE
Ketones, UA: NEGATIVE
Nitrite, UA: NEGATIVE
Protein, UA: NEGATIVE
RBC, UA: NEGATIVE
Specific Gravity, UA: 1.01 (ref 1.005–1.030)
Urobilinogen, Ur: 0.2 mg/dL (ref 0.2–1.0)
pH, UA: 5 (ref 5.0–7.5)

## 2017-07-28 LAB — MICROSCOPIC EXAMINATION
Bacteria, UA: NONE SEEN
Epithelial Cells (non renal): NONE SEEN /hpf (ref 0–10)
RBC, UA: NONE SEEN /hpf (ref 0–2)

## 2017-07-28 MED ORDER — CIPROFLOXACIN HCL 500 MG PO TABS: 500.0000 mg | ORAL_TABLET | Freq: Once | ORAL | Status: DC

## 2017-07-28 MED ORDER — MIRABEGRON ER 25 MG PO TB24: 25.0000 mg | ORAL_TABLET | Freq: Every day | ORAL | 11 refills | Status: DC

## 2017-07-28 MED ORDER — LIDOCAINE HCL URETHRAL/MUCOSAL 2 % EX GEL: 1.0000 "application " | Freq: Once | CUTANEOUS | Status: DC

## 2017-07-28 NOTE — Addendum Note (Signed)
Addended by: Kyra Manges on: 07/28/2017 04:38 PM   Modules accepted: Orders

## 2017-07-28 NOTE — Progress Notes (Signed)
   07/28/17  CC:  Chief Complaint  Patient presents with  . Cysto    HPI: The patient is a 67 year old gentleman with a past medical history of hypogonadism on Clomid, erectile dysfunction, and BPH on Flomax 0.4 mg milligrams and finasteride.  He presents today for cystoscopy.  His IPSS score recently was 28.  His PVR was 133 cc.  His complaints include frequency, urgency, nocturia, incontinence intermittency, weak stream.  He has had these symptoms for 2 to 3 years.  He is most bothered by urgency and urge incontinence.  He has to wear depends as a result.  He states that if his urgency with urge incontinence were corrected his urinary quality of life would be excellent.  Blood pressure 131/77, pulse (!) 52, height 5\' 5"  (1.651 m), weight 182 lb 1.6 oz (82.6 kg), SpO2 97 %. NED. A&Ox3.   No respiratory distress   Abd soft, NT, ND Normal phallus with bilateral descended testicles  Cystoscopy Procedure Note  Patient identification was confirmed, informed consent was obtained, and patient was prepped using Betadine solution.  Lidocaine jelly was administered per urethral meatus.    Preoperative abx where received prior to procedure.     Pre-Procedure: - Inspection reveals a normal caliber ureteral meatus.  Procedure: The flexible cystoscope was introduced without difficulty - No urethral strictures/lesions are present. - Enlarged prostate  with moderate to severe visual obstruction.  Approximately 5 cm in length. - Normal bladder neck - Bilateral ureteral orifices identified - Bladder mucosa  reveals no ulcers, tumors, or lesions - No bladder stones - No trabeculation  Retroflexion shows no intravesical lobe.   Post-Procedure: - Patient tolerated the procedure well  Assessment/ Plan:  1.  BPH with urgency/urge incontinence -Continue Flomax and finasteride -The patient does have a visually obstructive prostate with a fairly long prostatic urethra.  However, his biggest  complaint remains his urgency with urge incontinence.  We did discuss definitive treatment via TURP versus trying Myrbetriq to improve his urgency/urge incontinence and avoid surgery.  He does understand the Myrbetriq will not address any of his obstructive urinary symptoms, but he does not find other aspects of urination very bothersome.  He would like to try the conservative route with Myrbetriq 25 mg daily for 6 weeks.  At that time, we will reassess his symptoms.  We did discuss that definitive treatment would be a TURP.  We discussed this briefly.  He is familiar with this procedure as his father has had the surgery.  2.  Hypogonadism/erectile dysfunction Please see note from Zara Council, [place] that is dated 07/06/2017.  These issues were not discussed during today's visit.

## 2017-08-28 DIAGNOSIS — R69 Illness, unspecified: Secondary | ICD-10-CM | POA: Diagnosis not present

## 2017-09-05 NOTE — Progress Notes (Signed)
09/06/2017 2:13 PM   Jacob Pat Jr. Dec 28, 1950 [id number]  Referring provider: Baxter Hire, MD [place], [place] [address]  Chief Complaint  Patient presents with  . Testosterone Deficiency    HPI: Patient is a 67 year old WM with testosterone deficiency, erectile dysfunction and BPH with lower urinary tract symptoms who presents today for 6 week follow-up.  Testosterone deficiency He is no longer having spontaneous erections at night.  He does not have sleep apnea.   He is currently managing his hypogonadism with Clomid 50 mg, 1 tablets daily.  His current serum testosterone level is 440 ng/dL on 07/04/2017.  His HCT and LFT's are normal.    Erectile dysfunction His SHIM score is 20, which is mild ED.   His previous SHIM score was 18.  He has been having difficulty with erections for the last few years.   His major complaint is still his confidence to keep an erection.  His libido is diminished.   His risk factors for ED are age, BPH, testosterone deficiency, spinal stenosis, DM, HTN, HLD and anxiety.   He denies any painful erections or curvatures with his erections.      BPH WITH LUTS His IPSS score today is 15, which is moderate lower urinary tract symptomatology.  He is mixed with his quality life due to his urinary symptoms.   His PVR is 160 mL.   His BP is 149/76.  His previous IPSS score was 28/2.  His previous PVR is 133 mL.  His major complaint today is urgency and incontinence.  He has had these symptoms for two to three years.  He denies any dysuria, hematuria or suprapubic pain.   He currently taking tamsulosin 0.4 mg and finasteride 5 mg daily.  He also denies any recent fevers, chills, nausea or vomiting.  He does not have a family history of PCa.  He underwent cystoscopy with Dr.Budzyn on 07/28/2017 and was found to have a prostate with moderate to severe visual obstruction.  Approximately 5 cm in length.  He was given a trial of Myrbetriq 25  mg daily for his irritative symptoms.  He states the urge incontinence has improved 70% with the Myrbetriq 25 mg.  He still has to wear depends on long trips.   IPSS    Row Name 09/06/17 1300         International Prostate Symptom Score   How often have you had the sensation of not emptying your bladder?  About half the time     How often have you had to urinate less than every two hours?  About half the time     How often have you found you stopped and started again several times when you urinated?  Less than half the time     How often have you found it difficult to postpone urination?  About half the time     How often have you had a weak urinary stream?  Less than half the time     How often have you had to strain to start urination?  Less than 1 in 5 times     How many times did you typically get up at night to urinate?  1 Time     Total IPSS Score  15       Quality of Life due to urinary symptoms   If you were to spend the rest of your life with your urinary condition just the way it  is now how would you feel about that?  Mixed        Score:  1-7 Mild 8-19 Moderate 20-35 Severe   PMH: Past Medical History:  Diagnosis Date  . Adenomatous polyp   . Allergy   . Anxiety   . BPH (benign prostatic hypertrophy)   . Diabetes mellitus   . Diverticulosis   . Hyperlipidemia   . Hypertension   . Rosacea   . Spinal stenosis    disc disease    Surgical History: Past Surgical History:  Procedure Laterality Date  . ASD REPAIR    . L5-S1 Diskectomy  7/13   Dr Arnoldo Morale  . SPINAL CORD DECOMPRESSION      Home Medications:  Allergies as of 09/06/2017   No Known Allergies     Medication List        Accurate as of 09/06/17  2:13 PM. Always use your most recent med list.          aspirin 81 MG tablet Take 81 mg by mouth daily.   Aspirin-Calcium Carbonate 81-777 MG Tabs Take by mouth.   B-D ULTRAFINE III SHORT PEN 31G X 8 MM Misc Generic drug:  Insulin Pen  Needle USE AS DIRECTED   clomiPHENE 50 MG tablet Commonly known as:  CLOMID TAKE 1/2 TABLET BY MOUTH ONCE DAILY AS DIRECTED   esomeprazole 40 MG capsule Commonly known as:  NEXIUM TAKE ONE CAPSULE DAILY   finasteride 5 MG tablet Commonly known as:  PROSCAR Take 1 tablet (5 mg total) by mouth daily.   fish oil-omega-3 fatty acids 1000 MG capsule Take 1 g by mouth 2 (two) times daily.   fluticasone 50 MCG/ACT nasal spray Commonly known as:  FLONASE USE 2 SPRAYS IN EACH NOSTRIL            EVERY DAY   gabapentin 300 MG capsule Commonly known as:  NEURONTIN   glucosamine-chondroitin 500-400 MG tablet Take 1 tablet by mouth daily.   insulin glargine 100 UNIT/ML injection Commonly known as:  LANTUS Inject 40 Units into the skin at bedtime.   LANTUS SOLOSTAR 100 UNIT/ML Solostar Pen Generic drug:  Insulin Glargine INJECT 60 UNITS SQ NIGHTLY   LEVEMIR FLEXTOUCH 100 UNIT/ML Pen Generic drug:  Insulin Detemir   metFORMIN 1000 MG tablet Commonly known as:  GLUCOPHAGE TAKE ONE TABLET TWICE A DAY   mirabegron ER 25 MG Tb24 tablet Commonly known as:  MYRBETRIQ Take 1 tablet (25 mg total) by mouth daily.   mirabegron ER 25 MG Tb24 tablet Commonly known as:  MYRBETRIQ Take 1 tablet (25 mg total) by mouth daily.   multivitamin capsule Take 1 capsule by mouth daily.   ONE TOUCH ULTRA TEST test strip Generic drug:  glucose blood USE AS DIRECTED   pyridOXINE 100 MG tablet Commonly known as:  VITAMIN B-6 Take 100 mg by mouth daily.   ramipril 5 MG capsule Commonly known as:  ALTACE TAKE ONE CAPSULE DAILY   Selenium 100 MCG Caps Take by mouth as needed.   simvastatin 40 MG tablet Commonly known as:  ZOCOR TAKE 1 TABLET EVERY DAY   sitaGLIPtin 50 MG tablet Commonly known as:  JANUVIA Take by mouth.   tamsulosin 0.4 MG Caps capsule Commonly known as:  FLOMAX Take 1 capsule (0.4 mg total) by mouth daily.   TRULICITY 1.5 LF/8.1OF Sopn Generic drug:   Dulaglutide Inject into the skin.   vitamin C 500 MG tablet Commonly known as:  ASCORBIC ACID Take  500 mg by mouth daily.   zinc gluconate 50 MG tablet Take 50 mg by mouth daily.       Allergies: No Known Allergies  Family History: Family History  Problem Relation Age of Onset  . Diabetes Father   . Urolithiasis Father   . Alcohol abuse Paternal Aunt   . Kidney disease Cousin   . Kidney cancer Neg Hx   . Prostate cancer Neg Hx   . Bladder Cancer Neg Hx     Social History:  reports that he quit smoking about 10 years ago. His smoking use included cigars. He has never used smokeless tobacco. He reports that he drinks alcohol. He reports that he does not use drugs.  ROS: UROLOGY Frequent Urination?: No Hard to postpone urination?: Yes Burning/pain with urination?: No Get up at night to urinate?: No Leakage of urine?: Yes Urine stream starts and stops?: No Trouble starting stream?: No Do you have to strain to urinate?: No Blood in urine?: No Urinary tract infection?: No Sexually transmitted disease?: No Injury to kidneys or bladder?: No Painful intercourse?: No Weak stream?: No Erection problems?: No Penile pain?: No  Gastrointestinal Nausea?: No Vomiting?: No Indigestion/heartburn?: No Diarrhea?: No Constipation?: No  Constitutional Fever: No Night sweats?: No Weight loss?: No Fatigue?: No  Skin Skin rash/lesions?: No Itching?: No  Eyes Blurred vision?: No Double vision?: No  Ears/Nose/Throat Sore throat?: No Sinus problems?: No  Hematologic/Lymphatic Swollen glands?: No Easy bruising?: No  Cardiovascular Leg swelling?: No Chest pain?: No  Respiratory Cough?: No Shortness of breath?: No  Endocrine Excessive thirst?: No  Musculoskeletal Back pain?: No Joint pain?: No  Neurological Headaches?: No Dizziness?: No  Psychologic Depression?: No Anxiety?: No  Physical Exam: BP (!) 149/76 (BP Location: Right Arm, Patient  Position: Sitting, Cuff Size: Normal)   Ht 5\' 5"  (1.651 m)   Wt 176 lb 6.4 oz (80 kg)   BMI 29.35 kg/m   Constitutional: Well nourished. Alert and oriented, No acute distress. HEENT: [place] AT, moist mucus membranes. Trachea midline, no masses. Cardiovascular: No clubbing, cyanosis, or edema. Respiratory: Normal respiratory effort, no increased work of breathing. Skin: No rashes, bruises or suspicious lesions. Lymph: No cervical or inguinal adenopathy. Neurologic: Grossly intact, no focal deficits, moving all 4 extremities. Psychiatric: Normal mood and affect.  Laboratory Data: PSA  0.9 ng/mL on 01/02/2017 PSA 1.0 ng/mL on 07/05/2016 PSA 0.9 ng/mL on 07/04/2017 Lab Results  Component Value Date   PSA 1.48 04/18/2011   PSA 1.24 03/08/2010   PSA 1.18 03/18/2009    Lab Results  Component Value Date   TESTOSTERONE 440 07/04/2017    Lab Results  Component Value Date   AST 17 07/04/2017   Lab Results  Component Value Date   ALT 29 07/04/2017   TSH  1.530  02/2016  I have reviewed the labs.  Assessment & Plan:    1. Testosterone deficiency RTC in 6 months for HCT, testosterone, PSA, LFT's, ADAM and exam  2. BPH with LUTS I PSS score is 15/3, it is improved Would like to continue the Myrbetriq 25 mg daily RTC in 6 months I PSS, PSA and exam    3. Erectile dysfunction:    RTC in 6 months for SHIM score and exam, as testosterone therapy can affect erections  Return in about 6 months (around 03/08/2018) for IPSS, SHIM, PSA and exam, PSA, LFT's, HCT, HBG, testosterone (before 10 AM).  These notes generated with voice recognition software. I apologize for typographical  errors.  Zara Council, PA-C  [facility] [address] [place], [place] [address] [phone]

## 2017-09-06 ENCOUNTER — Ambulatory Visit (INDEPENDENT_AMBULATORY_CARE_PROVIDER_SITE_OTHER): Payer: Medicare HMO | Admitting: Urology

## 2017-09-06 ENCOUNTER — Encounter: Payer: Self-pay | Admitting: Urology

## 2017-09-06 VITALS — BP 149/76 | Ht 65.0 in | Wt 176.4 lb

## 2017-09-06 DIAGNOSIS — N138 Other obstructive and reflux uropathy: Secondary | ICD-10-CM

## 2017-09-06 DIAGNOSIS — N401 Enlarged prostate with lower urinary tract symptoms: Secondary | ICD-10-CM

## 2017-09-06 LAB — BLADDER SCAN AMB NON-IMAGING: Scan Result: 160

## 2017-09-06 MED ORDER — MIRABEGRON ER 25 MG PO TB24: 25.0000 mg | ORAL_TABLET | Freq: Every day | ORAL | 12 refills | Status: DC

## 2017-09-08 ENCOUNTER — Ambulatory Visit: Payer: Medicare HMO

## 2017-09-12 DIAGNOSIS — Z794 Long term (current) use of insulin: Secondary | ICD-10-CM | POA: Diagnosis not present

## 2017-09-12 DIAGNOSIS — E119 Type 2 diabetes mellitus without complications: Secondary | ICD-10-CM | POA: Diagnosis not present

## 2017-09-15 ENCOUNTER — Telehealth: Payer: Self-pay

## 2017-09-15 DIAGNOSIS — M5416 Radiculopathy, lumbar region: Secondary | ICD-10-CM | POA: Diagnosis not present

## 2017-09-15 DIAGNOSIS — M1711 Unilateral primary osteoarthritis, right knee: Secondary | ICD-10-CM | POA: Diagnosis not present

## 2017-09-15 DIAGNOSIS — M5136 Other intervertebral disc degeneration, lumbar region: Secondary | ICD-10-CM | POA: Diagnosis not present

## 2017-09-15 NOTE — Telephone Encounter (Signed)
Received prior auth for myrbetriq, covered alternatives include trospium, oxybutynin, or tolterodine. Please advise.

## 2017-09-16 NOTE — Telephone Encounter (Signed)
Please let Jacob Perkins know that his insurance requires him to try an alternative medication before covering Myrbetriq.  He can certainly stay on the Myrbetriq, but it will most likely be expensive.  We can send in a prescription for tropsium XL 60 mg daily and see how this works for him.    Tropsium XL 60 mg, 1 po daily, # 30.

## 2017-09-18 MED ORDER — TROSPIUM CHLORIDE ER 60 MG PO CP24: 60.0000 mg | ORAL_CAPSULE | Freq: Every day | ORAL | 2 refills | Status: DC

## 2017-09-18 NOTE — Telephone Encounter (Signed)
Patient notified and script sent 

## 2017-09-19 ENCOUNTER — Telehealth: Payer: Self-pay | Admitting: Urology

## 2017-09-19 NOTE — Telephone Encounter (Signed)
Pt called office today asking for the name of last Rx that was sent in.  I advised it was Tropsium XL 60 mg one a day.

## 2017-09-21 ENCOUNTER — Encounter: Payer: Self-pay | Admitting: Urology

## 2017-09-26 ENCOUNTER — Telehealth: Payer: Self-pay | Admitting: Urology

## 2017-09-26 DIAGNOSIS — E291 Testicular hypofunction: Secondary | ICD-10-CM

## 2017-09-26 NOTE — Telephone Encounter (Signed)
Pharm called [facility] asking for a Rx for Clomid for pt. Also advises pt will now be using [facility] for his pharmacy of choice. Please advise. Thanks.

## 2017-09-27 NOTE — Telephone Encounter (Signed)
Yes.  It is okay to refill the Clomid.

## 2017-09-27 NOTE — Telephone Encounter (Signed)
Okay for refill?  

## 2017-09-29 MED ORDER — CLOMIPHENE CITRATE 50 MG PO TABS: ORAL_TABLET | ORAL | 3 refills | Status: DC

## 2017-09-29 NOTE — Addendum Note (Signed)
Addended by: Garnette Gunner on: 09/29/2017 08:44 AM   Modules accepted: Orders

## 2017-10-09 DIAGNOSIS — R69 Illness, unspecified: Secondary | ICD-10-CM | POA: Diagnosis not present

## 2017-10-20 DIAGNOSIS — E119 Type 2 diabetes mellitus without complications: Secondary | ICD-10-CM | POA: Diagnosis not present

## 2017-10-27 DIAGNOSIS — J00 Acute nasopharyngitis [common cold]: Secondary | ICD-10-CM | POA: Diagnosis not present

## 2017-10-27 DIAGNOSIS — M5136 Other intervertebral disc degeneration, lumbar region: Secondary | ICD-10-CM | POA: Diagnosis not present

## 2017-10-27 DIAGNOSIS — N400 Enlarged prostate without lower urinary tract symptoms: Secondary | ICD-10-CM | POA: Diagnosis not present

## 2017-10-27 DIAGNOSIS — I10 Essential (primary) hypertension: Secondary | ICD-10-CM | POA: Diagnosis not present

## 2017-10-27 DIAGNOSIS — E7800 Pure hypercholesterolemia, unspecified: Secondary | ICD-10-CM | POA: Diagnosis not present

## 2017-10-27 DIAGNOSIS — E119 Type 2 diabetes mellitus without complications: Secondary | ICD-10-CM | POA: Diagnosis not present

## 2017-12-05 DIAGNOSIS — R69 Illness, unspecified: Secondary | ICD-10-CM | POA: Diagnosis not present

## 2018-01-10 ENCOUNTER — Other Ambulatory Visit: Payer: Self-pay | Admitting: Urology

## 2018-01-10 DIAGNOSIS — E119 Type 2 diabetes mellitus without complications: Secondary | ICD-10-CM | POA: Diagnosis not present

## 2018-01-10 DIAGNOSIS — Z794 Long term (current) use of insulin: Secondary | ICD-10-CM | POA: Diagnosis not present

## 2018-01-12 ENCOUNTER — Other Ambulatory Visit: Payer: Self-pay | Admitting: Family Medicine

## 2018-01-12 MED ORDER — TAMSULOSIN HCL 0.4 MG PO CAPS: 0.4000 mg | ORAL_CAPSULE | Freq: Every day | ORAL | 4 refills | Status: DC

## 2018-01-16 ENCOUNTER — Other Ambulatory Visit: Payer: Self-pay | Admitting: Urology

## 2018-01-16 DIAGNOSIS — N138 Other obstructive and reflux uropathy: Secondary | ICD-10-CM

## 2018-01-16 DIAGNOSIS — N401 Enlarged prostate with lower urinary tract symptoms: Secondary | ICD-10-CM

## 2018-01-17 DIAGNOSIS — Z794 Long term (current) use of insulin: Secondary | ICD-10-CM | POA: Diagnosis not present

## 2018-01-17 DIAGNOSIS — E119 Type 2 diabetes mellitus without complications: Secondary | ICD-10-CM | POA: Diagnosis not present

## 2018-01-18 ENCOUNTER — Other Ambulatory Visit: Payer: Self-pay | Admitting: Family Medicine

## 2018-01-18 DIAGNOSIS — N401 Enlarged prostate with lower urinary tract symptoms: Secondary | ICD-10-CM

## 2018-01-18 DIAGNOSIS — N138 Other obstructive and reflux uropathy: Secondary | ICD-10-CM

## 2018-01-18 MED ORDER — FINASTERIDE 5 MG PO TABS: 5.0000 mg | ORAL_TABLET | Freq: Every day | ORAL | 3 refills | Status: DC

## 2018-01-18 MED ORDER — TROSPIUM CHLORIDE ER 60 MG PO CP24: 60.0000 mg | ORAL_CAPSULE | Freq: Every day | ORAL | 3 refills | Status: DC

## 2018-01-19 DIAGNOSIS — R69 Illness, unspecified: Secondary | ICD-10-CM | POA: Diagnosis not present

## 2018-01-22 ENCOUNTER — Other Ambulatory Visit: Payer: Self-pay | Admitting: Urology

## 2018-01-22 DIAGNOSIS — E291 Testicular hypofunction: Secondary | ICD-10-CM

## 2018-01-22 NOTE — Telephone Encounter (Signed)
Pt needs a refill for Clomiphene sent to [facility].

## 2018-01-22 NOTE — Telephone Encounter (Signed)
Please advise 

## 2018-02-08 ENCOUNTER — Telehealth: Payer: Self-pay

## 2018-02-08 NOTE — Telephone Encounter (Signed)
Jacob Perkins has approved Clomiphene Citrate for patient from 03/26/2017-03/28/2019 REF # [id number]

## 2018-02-08 NOTE — Telephone Encounter (Signed)
Called patient insurance [employer] [phone] about the clomiphene prior authorization, and they have the information under review.  Topic ID [id number].

## 2018-03-01 ENCOUNTER — Other Ambulatory Visit: Payer: Self-pay

## 2018-03-01 DIAGNOSIS — E291 Testicular hypofunction: Secondary | ICD-10-CM

## 2018-03-01 DIAGNOSIS — N138 Other obstructive and reflux uropathy: Secondary | ICD-10-CM

## 2018-03-01 DIAGNOSIS — N529 Male erectile dysfunction, unspecified: Secondary | ICD-10-CM

## 2018-03-01 DIAGNOSIS — E349 Endocrine disorder, unspecified: Secondary | ICD-10-CM

## 2018-03-01 DIAGNOSIS — N401 Enlarged prostate with lower urinary tract symptoms: Secondary | ICD-10-CM

## 2018-03-02 ENCOUNTER — Other Ambulatory Visit: Payer: Medicare HMO

## 2018-03-02 DIAGNOSIS — E349 Endocrine disorder, unspecified: Secondary | ICD-10-CM | POA: Diagnosis not present

## 2018-03-02 DIAGNOSIS — E291 Testicular hypofunction: Secondary | ICD-10-CM | POA: Diagnosis not present

## 2018-03-02 DIAGNOSIS — N401 Enlarged prostate with lower urinary tract symptoms: Secondary | ICD-10-CM | POA: Diagnosis not present

## 2018-03-02 DIAGNOSIS — N529 Male erectile dysfunction, unspecified: Secondary | ICD-10-CM | POA: Diagnosis not present

## 2018-03-02 DIAGNOSIS — N138 Other obstructive and reflux uropathy: Secondary | ICD-10-CM

## 2018-03-03 LAB — HEMOGLOBIN AND HEMATOCRIT, BLOOD
Hematocrit: 41.8 % (ref 37.5–51.0)
Hemoglobin: 14.2 g/dL (ref 13.0–17.7)

## 2018-03-03 LAB — HEPATIC FUNCTION PANEL
ALT: 14 IU/L (ref 0–44)
AST: 15 IU/L (ref 0–40)
Albumin: 4.5 g/dL (ref 3.6–4.8)
Alkaline Phosphatase: 27 IU/L — ABNORMAL LOW (ref 39–117)
Bilirubin Total: 0.3 mg/dL (ref 0.0–1.2)
Bilirubin, Direct: 0.09 mg/dL (ref 0.00–0.40)
Total Protein: 6.4 g/dL (ref 6.0–8.5)

## 2018-03-03 LAB — TESTOSTERONE: Testosterone: 493 ng/dL (ref 264–916)

## 2018-03-03 LAB — PSA: Prostate Specific Ag, Serum: 0.9 ng/mL (ref 0.0–4.0)

## 2018-03-06 DIAGNOSIS — I10 Essential (primary) hypertension: Secondary | ICD-10-CM | POA: Diagnosis not present

## 2018-03-06 DIAGNOSIS — N400 Enlarged prostate without lower urinary tract symptoms: Secondary | ICD-10-CM | POA: Diagnosis not present

## 2018-03-06 DIAGNOSIS — E119 Type 2 diabetes mellitus without complications: Secondary | ICD-10-CM | POA: Diagnosis not present

## 2018-03-06 DIAGNOSIS — E7800 Pure hypercholesterolemia, unspecified: Secondary | ICD-10-CM | POA: Diagnosis not present

## 2018-03-07 ENCOUNTER — Encounter: Payer: Self-pay | Admitting: Urology

## 2018-03-07 ENCOUNTER — Ambulatory Visit: Payer: Medicare HMO | Admitting: Urology

## 2018-03-07 VITALS — BP 133/71 | HR 58 | Ht 65.0 in | Wt 175.0 lb

## 2018-03-07 DIAGNOSIS — N138 Other obstructive and reflux uropathy: Secondary | ICD-10-CM | POA: Diagnosis not present

## 2018-03-07 DIAGNOSIS — R69 Illness, unspecified: Secondary | ICD-10-CM | POA: Diagnosis not present

## 2018-03-07 DIAGNOSIS — N401 Enlarged prostate with lower urinary tract symptoms: Secondary | ICD-10-CM | POA: Diagnosis not present

## 2018-03-07 DIAGNOSIS — E349 Endocrine disorder, unspecified: Secondary | ICD-10-CM

## 2018-03-07 DIAGNOSIS — N529 Male erectile dysfunction, unspecified: Secondary | ICD-10-CM

## 2018-03-07 LAB — BLADDER SCAN AMB NON-IMAGING: Scan Result: 217

## 2018-03-07 NOTE — Progress Notes (Signed)
03/07/2018 2:33 PM   Jacob Pat Jr. 09-19-1950 [id number]  Referring provider: Baxter Hire, MD [place], [place] [address]  Chief Complaint  Patient presents with  . Follow-up    HPI: Patient is a 67 year old WM with testosterone deficiency, erectile dysfunction and BPH with lower urinary tract symptoms who presents today for 6 month follow-up.  Testosterone deficiency He is no longer having spontaneous erections at night.  He does not have sleep apnea.   He is currently managing his testosterone deficiency with Clomid 50 mg, 1 tablets daily.  His current serum testosterone level is 493 ng/dL on 03/02/2018.  His HCT and LFT's are normal.    Erectile dysfunction His SHIM score is 18, which is mild ED.   His previous SHIM score was 20.  He has been having difficulty with erections for the last few years.   His major complaint is still his confidence to keep an erection.  His libido is diminished.   His risk factors for ED are age, BPH, testosterone deficiency, spinal stenosis, DM, HTN, HLD and anxiety.   He denies any painful erections or curvatures with his erections.     SHIM    Row Name 03/07/18 1412         SHIM: Over the last 6 months:   How do you rate your confidence that you could get and keep an erection?  Moderate     When you had erections with sexual stimulation, how often were your erections hard enough for penetration (entering your partner)?  Most Times (much more than half the time)     During sexual intercourse, how often were you able to maintain your erection after you had penetrated (entered) your partner?  Most Times (much more than half the time)     During sexual intercourse, how difficult was it to maintain your erection to completion of intercourse?  Slightly Difficult     When you attempted sexual intercourse, how often was it satisfactory for you?  Sometimes (about half the time)       SHIM Total Score   SHIM  18         Score: 1-7 Severe ED 8-11 Moderate ED 12-16 Mild-Moderate ED 17-21 Mild ED 22-25 No ED   BPH WITH LUTS His IPSS score today is 12, which is moderate lower urinary tract symptomatology.  He is mixed with his quality life due to his urinary symptoms.   His PVR is 217 mL.   His BP is 133/71.  His previous IPSS score was 15/3.  His previous PVR is 160 mL.  His major complaint today is frequency, urgency and incontinence.  He has had these symptoms for two to three years.  He denies any dysuria, hematuria or suprapubic pain.   He currently taking tropsium XL 60 mg, tamsulosin 0.4 mg and finasteride 5 mg daily.  He also denies any recent fevers, chills, nausea or vomiting.  He does not have a family history of PCa.  He underwent cystoscopy with Dr.Budzyn on 07/28/2017 and was found to have a prostate with moderate to severe visual obstruction.  Approximately 5 cm in length.  He still has to wear depends on long trips.   IPSS    Row Name 03/07/18 1400         International Prostate Symptom Score   How often have you had the sensation of not emptying your bladder?  Less than half the time  How often have you had to urinate less than every two hours?  About half the time     How often have you found you stopped and started again several times when you urinated?  Less than 1 in 5 times     How often have you found it difficult to postpone urination?  About half the time     How often have you had a weak urinary stream?  Less than 1 in 5 times     How often have you had to strain to start urination?  Less than 1 in 5 times     How many times did you typically get up at night to urinate?  1 Time     Total IPSS Score  12       Quality of Life due to urinary symptoms   If you were to spend the rest of your life with your urinary condition just the way it is now how would you feel about that?  Mixed        Score:  1-7 Mild 8-19 Moderate 20-35 Severe   PMH: Past Medical History:   Diagnosis Date  . Adenomatous polyp   . Allergy   . Anxiety   . BPH (benign prostatic hypertrophy)   . Diabetes mellitus   . Diverticulosis   . Hyperlipidemia   . Hypertension   . Rosacea   . Spinal stenosis    disc disease    Surgical History: Past Surgical History:  Procedure Laterality Date  . ASD REPAIR    . L5-S1 Diskectomy  7/13   Dr Arnoldo Morale  . SPINAL CORD DECOMPRESSION      Home Medications:  Allergies as of 03/07/2018   No Known Allergies     Medication List        Accurate as of 03/07/18  2:33 PM. Always use your most recent med list.          aspirin 81 MG tablet Take 81 mg by mouth daily.   Aspirin-Calcium Carbonate 81-777 MG Tabs Take by mouth.   B-D ULTRAFINE III SHORT PEN 31G X 8 MM Misc Generic drug:  Insulin Pen Needle USE AS DIRECTED   clomiPHENE 50 MG tablet Commonly known as:  CLOMID TAKE 1/2 TABLET BY MOUTH ONCE DAILY AS DIRECTED   esomeprazole 40 MG capsule Commonly known as:  NEXIUM TAKE ONE CAPSULE DAILY   finasteride 5 MG tablet Commonly known as:  PROSCAR Take 1 tablet (5 mg total) by mouth daily.   finasteride 5 MG tablet Commonly known as:  PROSCAR TAKE 1 TABLET BY MOUTH ONCE DAILY   fish oil-omega-3 fatty acids 1000 MG capsule Take 1 g by mouth 2 (two) times daily.   fluticasone 50 MCG/ACT nasal spray Commonly known as:  FLONASE USE 2 SPRAYS IN EACH NOSTRIL            EVERY DAY   gabapentin 300 MG capsule Commonly known as:  NEURONTIN   glucosamine-chondroitin 500-400 MG tablet Take 1 tablet by mouth daily.   insulin glargine 100 UNIT/ML injection Commonly known as:  LANTUS Inject 40 Units into the skin at bedtime.   LANTUS SOLOSTAR 100 UNIT/ML Solostar Pen Generic drug:  Insulin Glargine INJECT 60 UNITS SQ NIGHTLY   LEVEMIR FLEXTOUCH 100 UNIT/ML Pen Generic drug:  Insulin Detemir   metFORMIN 1000 MG tablet Commonly known as:  GLUCOPHAGE TAKE ONE TABLET TWICE A DAY   mirabegron ER 25 MG Tb24  tablet Commonly known as:  MYRBETRIQ Take 1 tablet (25 mg total) by mouth daily.   mirabegron ER 25 MG Tb24 tablet Commonly known as:  MYRBETRIQ Take 1 tablet (25 mg total) by mouth daily.   multivitamin capsule Take 1 capsule by mouth daily.   ONE TOUCH ULTRA TEST test strip Generic drug:  glucose blood USE AS DIRECTED   pyridOXINE 100 MG tablet Commonly known as:  VITAMIN B-6 Take 100 mg by mouth daily.   ramipril 5 MG capsule Commonly known as:  ALTACE TAKE ONE CAPSULE DAILY   Selenium 100 MCG Caps Take by mouth as needed.   simvastatin 40 MG tablet Commonly known as:  ZOCOR TAKE 1 TABLET EVERY DAY   sitaGLIPtin 50 MG tablet Commonly known as:  JANUVIA Take by mouth.   tamsulosin 0.4 MG Caps capsule Commonly known as:  FLOMAX Take 1 capsule (0.4 mg total) by mouth daily.   Trospium Chloride 60 MG Cp24 Take 1 capsule (60 mg total) by mouth daily.   Trospium Chloride 60 MG Cp24 TAKE 1 CAPSULE BY MOUTH ONCE DAILY   TRULICITY 1.5 JT/7.0VX Sopn Generic drug:  Dulaglutide Inject into the skin.   vitamin C 500 MG tablet Commonly known as:  ASCORBIC ACID Take 500 mg by mouth daily.   zinc gluconate 50 MG tablet Take 50 mg by mouth daily.       Allergies: No Known Allergies  Family History: Family History  Problem Relation Age of Onset  . Diabetes Father   . Urolithiasis Father   . Alcohol abuse Paternal Aunt   . Kidney disease Cousin   . Kidney cancer Neg Hx   . Prostate cancer Neg Hx   . Bladder Cancer Neg Hx     Social History:  reports that he quit smoking about 10 years ago. His smoking use included cigars. He has never used smokeless tobacco. He reports that he drinks alcohol. He reports that he does not use drugs.  ROS: UROLOGY Frequent Urination?: Yes Hard to postpone urination?: Yes Burning/pain with urination?: No Get up at night to urinate?: No Leakage of urine?: Yes Urine stream starts and stops?: No Trouble starting stream?:  No Do you have to strain to urinate?: No Blood in urine?: No Urinary tract infection?: No Sexually transmitted disease?: No Injury to kidneys or bladder?: No Painful intercourse?: No Weak stream?: No Erection problems?: No Penile pain?: No  Gastrointestinal Nausea?: No Vomiting?: No Indigestion/heartburn?: No Diarrhea?: No Constipation?: No  Constitutional Fever: No Night sweats?: No Weight loss?: No Fatigue?: No  Skin Skin rash/lesions?: No Itching?: No  Eyes Blurred vision?: No Double vision?: No  Ears/Nose/Throat Sore throat?: No Sinus problems?: No  Hematologic/Lymphatic Swollen glands?: No Easy bruising?: No  Cardiovascular Leg swelling?: No Chest pain?: No  Respiratory Cough?: No Shortness of breath?: No  Endocrine Excessive thirst?: No  Musculoskeletal Back pain?: Yes Joint pain?: Yes  Neurological Headaches?: No Dizziness?: No  Psychologic Depression?: No Anxiety?: No  Physical Exam: BP 133/71   Pulse (!) 58   Ht 5\' 5"  (1.651 m)   Wt 175 lb (79.4 kg)   BMI 29.12 kg/m   Constitutional: Well nourished. Alert and oriented, No acute distress. HEENT: [place] AT, moist mucus membranes. Trachea midline, no masses. Cardiovascular: No clubbing, cyanosis, or edema. Respiratory: Normal respiratory effort, no increased work of breathing. GI: Abdomen is soft, non tender, non distended, no abdominal masses. Liver and spleen not palpable.  No hernias appreciated.  Stool sample for occult testing is not indicated.  GU: No CVA tenderness.  No bladder fullness or masses.  Patient with circumcised phallus.  Urethral meatus is patent.  No penile discharge. No penile lesions or rashes. Scrotum without lesions, cysts, rashes and/or edema.  Testicles are located scrotally bilaterally. No masses are appreciated in the testicles. Left and right epididymis are normal. Rectal: Patient with  normal sphincter tone. Anus and perineum without scarring or rashes. No  rectal masses are appreciated. Prostate is approximately 50 grams, no nodules are appreciated. Seminal vesicles are normal. Skin: No rashes, bruises or suspicious lesions. Lymph: No cervical or inguinal adenopathy. Neurologic: Grossly intact, no focal deficits, moving all 4 extremities. Psychiatric: Normal mood and affect.   Laboratory Data: PSA  0.9 ng/mL on 01/02/2017 PSA 1.0 ng/mL on 07/05/2016 PSA 0.9 ng/mL on 07/04/2017 PSA 0.9 ng/mL on 03/02/2018 Lab Results  Component Value Date   PSA 1.48 04/18/2011   PSA 1.24 03/08/2010   PSA 1.18 03/18/2009    Lab Results  Component Value Date   TESTOSTERONE 493 03/02/2018    Lab Results  Component Value Date   AST 15 03/02/2018   Lab Results  Component Value Date   ALT 14 03/02/2018   TSH  1.530  02/2016  I have reviewed the labs.  Pertinent Imaging Component     Latest Ref Rng & Units 09/28/2015 01/07/2016 07/07/2016 01/05/2017  Scan Result      228 147 133 138   Component     Latest Ref Rng & Units 09/06/2017 03/07/2018  Scan Result      160 217 ml    Assessment & Plan:    1. Testosterone deficiency -continue Clomid 50 mg, 1 tablet daily -RTC in 6 months for HCT, testosterone, PSA, LFT's, ADAM and exam  2. BPH with LUTS -IPSS score is 12/3, it is improving -Continue conservative management, avoiding bladder irritants and timed voiding's -Most bothersome symptoms is/are urgency -Continue tamsulosin 0.4 mg daily and finasteride 5 mg daily; refills given -Considering a bladder outlet procedure -RTC in 6 months for IPSS, PSA, PVR and exam   3. Erectile dysfunction:   -SHIM score 18, it is stable  -RTC in 6 months for SHIM score and exam, as testosterone therapy can affect erections  Return for appointment with one of the physicians to discuss BOO surgery .  These notes generated with voice recognition software. I apologize for typographical errors.  Zara Council, PA-C  [facility] [address] [place], [place] [address] [phone]

## 2018-03-13 DIAGNOSIS — E291 Testicular hypofunction: Secondary | ICD-10-CM | POA: Diagnosis not present

## 2018-03-13 DIAGNOSIS — I10 Essential (primary) hypertension: Secondary | ICD-10-CM | POA: Diagnosis not present

## 2018-03-13 DIAGNOSIS — Z794 Long term (current) use of insulin: Secondary | ICD-10-CM | POA: Diagnosis not present

## 2018-03-13 DIAGNOSIS — E1142 Type 2 diabetes mellitus with diabetic polyneuropathy: Secondary | ICD-10-CM | POA: Diagnosis not present

## 2018-03-13 DIAGNOSIS — E785 Hyperlipidemia, unspecified: Secondary | ICD-10-CM | POA: Diagnosis not present

## 2018-03-13 DIAGNOSIS — N3941 Urge incontinence: Secondary | ICD-10-CM | POA: Diagnosis not present

## 2018-03-13 DIAGNOSIS — K5900 Constipation, unspecified: Secondary | ICD-10-CM | POA: Diagnosis not present

## 2018-03-13 DIAGNOSIS — K219 Gastro-esophageal reflux disease without esophagitis: Secondary | ICD-10-CM | POA: Diagnosis not present

## 2018-03-13 DIAGNOSIS — M1990 Unspecified osteoarthritis, unspecified site: Secondary | ICD-10-CM | POA: Diagnosis not present

## 2018-03-13 DIAGNOSIS — H547 Unspecified visual loss: Secondary | ICD-10-CM | POA: Diagnosis not present

## 2018-04-03 ENCOUNTER — Encounter: Payer: Self-pay | Admitting: Urology

## 2018-04-03 ENCOUNTER — Ambulatory Visit (INDEPENDENT_AMBULATORY_CARE_PROVIDER_SITE_OTHER): Payer: Medicare HMO | Admitting: Urology

## 2018-04-03 VITALS — BP 129/70 | HR 51 | Wt 178.4 lb

## 2018-04-03 DIAGNOSIS — R350 Frequency of micturition: Secondary | ICD-10-CM

## 2018-04-03 DIAGNOSIS — N401 Enlarged prostate with lower urinary tract symptoms: Secondary | ICD-10-CM | POA: Diagnosis not present

## 2018-04-03 NOTE — Progress Notes (Addendum)
   04/03/2018 3:18 PM   Jacob Pat Jr. 1950-11-05 [id number]  Reason for visit: Follow up BPH/LUTS  HPI: I met Jacob Perkins for the first time today to discuss his BPH and urinary symptoms.  He was previously followed by Dr. Pilar Jarvis as well as our PA Zara Council.  His primary complaint is urinary frequency, urgency, and urge incontinence, however he has had PVRs around 200 to 250 cc previously.  He is on maximal medical therapy with tamsulosin and finasteride.  He was previously on Myrbetriq which significantly improved his symptoms, however this was cost prohibitive.  He also trialed trospium in the fall, but discontinued this secondary to severe constipation.  He underwent cystoscopy with Dr. Ivory Broad in May 2019 which showed a 5 cm length prostate with moderate to severe visual obstruction.  He has no cross-sectional imaging to evaluate prostate volume.  PSA 02/2018 0.9 on finasteride, corrected 1.8.  He is otherwise very healthy and does not take any blood thinning medications.   Assessment & Plan:   In summary, patient is a healthy 68 year old male with mixed bothersome urinary symptoms, primarily urgency and urge incontinence but with elevated PVR around 200 cc.  He is interested in definitive surgical management.  We had a long discussion about the differences between bladder outlet obstruction and overactive bladder, and the different pathways for management and surgical intervention.   I strongly recommended pursuing urodynamics at [facility] in [place], and following up with me to discuss the results.  We discussed the importance of objective data before pursuing more definitive management.  The patient and his wife are in agreement.  -RTC to review UDS results -BOO, will need TRUS to evaluate prostate volume prior to considering TURP or HOLEP -If detrusor overactivity, likely pursue PTNS and/or re-trial Mybetriq with savings card  A total of 15 minutes were spent  face-to-face with the patient, greater than 50% was spent in patient education, counseling, and coordination of care regarding BPH, overactive bladder, outlet procedures, and overactive bladder treatment strategies.   Billey Co, [facility] [address] [place], [place] [address] [phone]

## 2018-04-25 DIAGNOSIS — R3915 Urgency of urination: Secondary | ICD-10-CM | POA: Diagnosis not present

## 2018-04-25 DIAGNOSIS — R350 Frequency of micturition: Secondary | ICD-10-CM | POA: Diagnosis not present

## 2018-04-25 DIAGNOSIS — R69 Illness, unspecified: Secondary | ICD-10-CM | POA: Diagnosis not present

## 2018-05-01 ENCOUNTER — Other Ambulatory Visit: Payer: Self-pay | Admitting: Urology

## 2018-05-01 DIAGNOSIS — M5136 Other intervertebral disc degeneration, lumbar region: Secondary | ICD-10-CM | POA: Diagnosis not present

## 2018-05-01 DIAGNOSIS — R69 Illness, unspecified: Secondary | ICD-10-CM | POA: Diagnosis not present

## 2018-05-01 DIAGNOSIS — Z86010 Personal history of colonic polyps: Secondary | ICD-10-CM | POA: Diagnosis not present

## 2018-05-01 DIAGNOSIS — I10 Essential (primary) hypertension: Secondary | ICD-10-CM | POA: Diagnosis not present

## 2018-05-01 DIAGNOSIS — E119 Type 2 diabetes mellitus without complications: Secondary | ICD-10-CM | POA: Diagnosis not present

## 2018-05-14 ENCOUNTER — Ambulatory Visit (INDEPENDENT_AMBULATORY_CARE_PROVIDER_SITE_OTHER): Payer: Medicare HMO | Admitting: Urology

## 2018-05-14 ENCOUNTER — Telehealth: Payer: Self-pay | Admitting: Radiology

## 2018-05-14 ENCOUNTER — Other Ambulatory Visit: Payer: Self-pay | Admitting: Radiology

## 2018-05-14 ENCOUNTER — Encounter: Payer: Self-pay | Admitting: Urology

## 2018-05-14 VITALS — BP 148/74 | HR 53 | Ht 65.0 in | Wt 179.2 lb

## 2018-05-14 DIAGNOSIS — R350 Frequency of micturition: Secondary | ICD-10-CM

## 2018-05-14 DIAGNOSIS — N401 Enlarged prostate with lower urinary tract symptoms: Secondary | ICD-10-CM

## 2018-05-14 DIAGNOSIS — N138 Other obstructive and reflux uropathy: Secondary | ICD-10-CM

## 2018-05-14 LAB — MICROSCOPIC EXAMINATION
Bacteria, UA: NONE SEEN
WBC, UA: NONE SEEN /HPF (ref 0–5)

## 2018-05-14 LAB — URINALYSIS, COMPLETE
Bilirubin, UA: NEGATIVE
Glucose, UA: NEGATIVE
Ketones, UA: NEGATIVE
Leukocytes, UA: NEGATIVE
Nitrite, UA: NEGATIVE
Protein, UA: NEGATIVE
Specific Gravity, UA: 1.01 (ref 1.005–1.030)
Urobilinogen, Ur: 0.2 mg/dL (ref 0.2–1.0)
pH, UA: 5.5 (ref 5.0–7.5)

## 2018-05-14 NOTE — Progress Notes (Signed)
   05/14/2018 3:16 PM   Jacob Pat Jr. [birth year] [id number]  Reason for visit: Follow up UDS results  Mr. Dooly is a healthy 68 year old male with a long history of significant urinary symptoms including urgency, urge incontinence, weak stream, feeling of incomplete emptying, and elevated PVRs of 200-300cc.  He has had minimal symptom improvement despite maximal medical therapy with Flomax and finasteride.    He recently underwent urodynamics for further evaluation, which demonstrated bladder outlet obstruction with max flow of 12 mL/s, max detrusor pressure of 71 cm of water, and PVR of 240 cc.  There was some low amplitude instability at 500 cc, however he was able to inhibit the contraction without leakage.  There was no urge incontinence.  Transrectal ultrasound of the prostate was performed and demonstrated an 82 g gland with intravesical protrusion.  There were no hypoechoic areas noted.  PSA December 2019 was 0.9, corrected on finasteride to 1.8.  We discussed the risks and benefits of HOLEP at length.  The procedure requires general anesthesia and takes 2 to 3 hours, and a holmium laser is used to enucleate the prostate and push this tissue into the bladder.  A morcellator is then used to remove this tissue, which is sent for pathology.  Majority of patients are able to discharge the same day with a catheter in place for 2 to 3 days, and will follow-up in clinic for a voiding trial.  Approximately 10% of patients will be admitted overnight to monitor the urine, or if they have multiple comorbidities.  We specifically discussed the risks of bleeding, infection, retrograde ejaculation, temporary urgency and urge incontinence, very low risk of long-term incontinence, and possible need for additional procedures.  Schedule HOLEP  Billey Co, [facility] [address] [place], [place] [address] [phone]

## 2018-05-14 NOTE — Telephone Encounter (Signed)
Patient was given the [facility] Information form below as well as the Instructions for Pre-Admission Testing form & a map of [facility].   [place], [place], [place] [address] Telephone: [phone] Fax: [phone]   Thank you for choosing [facility] for your upcoming surgery!  We are always here to assist in your urological needs.  Please read the following information with specific details for your upcoming appointments related to your surgery. Please contact Laraine Samet at [phone] Option 3 with any questions.  The Name of Your Surgery: Holmium laser enucleation of the prostate Your Surgery Date: 05/25/2018 Your Surgeon: Nickolas Madrid  Please call Same Day Surgery at [phone] between the hours of 1pm-3pm one day prior to your surgery. They will inform you of the time to arrive at Same Day Surgery which is located on the second floor of the [facility].   Please refer to the attached letter regarding instructions for Pre-Admission Testing. You will receive a call from the [place] office regarding your appointment with them.  The Pre-Admission Testing office is located at [place], on the first floor of the [place] at [facility] in [place] (office is to the right as you enter through the [employer] of the [facility]). Please have all medications you are currently taking and your insurance card available.   Patient was advised to have nothing to eat or drink after midnight the night prior to surgery except that he may have only water until 2 hours before surgery with nothing to drink within 2 hours of surgery.  The patient states he currently takes aspirin 81mg  & was informed to hold medication for 7 days prior to surgery beginning on 05/18/2018. Patient's questions were answered and he expressed  understanding of these instructions.

## 2018-05-16 LAB — CULTURE, URINE COMPREHENSIVE

## 2018-05-17 ENCOUNTER — Telehealth: Payer: Self-pay

## 2018-05-17 MED ORDER — SULFAMETHOXAZOLE-TRIMETHOPRIM 800-160 MG PO TABS: 1.0000 | ORAL_TABLET | Freq: Two times a day (BID) | ORAL | 0 refills | Status: AC

## 2018-05-17 NOTE — Telephone Encounter (Signed)
-----   Message from Billey Co, MD sent at 05/16/2018  2:57 PM EST ----- Please start him on a 7 day course of Bactrim DS BID now, he has surgery next Friday. Bacteria was seen in his urine.  Nickolas Madrid, MD 05/16/2018

## 2018-05-17 NOTE — Telephone Encounter (Signed)
Bactrim DS sent to pharmacy per Dr Diamantina Providence. Called and advised patient.

## 2018-05-18 ENCOUNTER — Encounter
Admission: RE | Admit: 2018-05-18 | Discharge: 2018-05-18 | Disposition: A | Discharge status: Home / Self Care | Payer: Medicare HMO | Source: Ambulatory Visit | Attending: Urology | Admitting: Urology

## 2018-05-18 ENCOUNTER — Other Ambulatory Visit: Payer: Self-pay

## 2018-05-18 DIAGNOSIS — Z01812 Encounter for preprocedural laboratory examination: Secondary | ICD-10-CM | POA: Insufficient documentation

## 2018-05-18 DIAGNOSIS — I10 Essential (primary) hypertension: Secondary | ICD-10-CM | POA: Diagnosis not present

## 2018-05-18 HISTORY — DX: Gastro-esophageal reflux disease without esophagitis: K21.9

## 2018-05-18 LAB — BASIC METABOLIC PANEL
Anion gap: 11 (ref 5–15)
BUN: 22 mg/dL (ref 8–23)
CO2: 22 mmol/L (ref 22–32)
Calcium: 8.9 mg/dL (ref 8.9–10.3)
Chloride: 107 mmol/L (ref 98–111)
Creatinine, Ser: 0.65 mg/dL (ref 0.61–1.24)
GFR calc Af Amer: >60 mL/min (ref >60)
GFR calc non Af Amer: >60 mL/min (ref >60)
Glucose, Bld: 84 mg/dL (ref 70–99)
Potassium: 4 mmol/L (ref 3.5–5.1)
Sodium: 140 mmol/L (ref 135–145)

## 2018-05-18 LAB — CBC
HCT: 42.9 % (ref 39.0–52.0)
Hemoglobin: 14.2 g/dL (ref 13.0–17.0)
MCH: 30 pg (ref 26.0–34.0)
MCHC: 33.1 g/dL (ref 30.0–36.0)
MCV: 90.5 fL (ref 80.0–100.0)
Platelets: 203 10*3/uL (ref 150–400)
RBC: 4.74 MIL/uL (ref 4.22–5.81)
RDW: 13.3 % (ref 11.5–15.5)
WBC: 8.7 10*3/uL (ref 4.0–10.5)
nRBC: 0 % (ref 0.0–0.2)

## 2018-05-18 NOTE — Patient Instructions (Signed)
Your procedure is scheduled on: Friday 05/25/2018 Report to [facility]. To find out your arrival time please call [phone] between 1PM - 3PM on Thursday 05/24/2018.  Remember: Instructions that are not followed completely may result in serious medical risk, up to and including death, or upon the discretion of your surgeon and anesthesiologist your surgery may need to be rescheduled.     _X__ 1. Do not eat food after midnight the night before your procedure.                 No gum chewing or hard candies. You may drink clear liquids up to 2 hours                 before you are scheduled to arrive for your surgery- DO not drink clear                 liquids within 2 hours of the start of your surgery.                 Clear Liquids include:  water   __X__2.  On the morning of surgery brush your teeth with toothpaste and water, you                 may rinse your mouth with mouthwash if you wish.  Do not swallow any              toothpaste of mouthwash.     _X__ 3.  No Alcohol for 24 hours before or after surgery.   _X__ 4.  Do Not Smoke or use e-cigarettes For 24 Hours Prior to Your Surgery.                 Do not use any chewable tobacco products for at least 6 hours prior to                 surgery.  ____  5.  Bring all medications with you on the day of surgery if instructed.   __X__  6.  Notify your doctor if there is any change in your medical condition      (cold, fever, infections).     Do not wear jewelry, make-up, hairpins, clips or nail polish. Do not wear lotions, powders, or perfumes.  Do not shave 48 hours prior to surgery. Men may shave face and neck. Do not bring valuables to the hospital.    [facility] is not responsible for any belongings or valuables.  Contacts, dentures/partials or body piercings may not be worn into surgery. Bring a case for your contacts, glasses or hearing aids, a denture cup will be  supplied. Leave your suitcase in the car. After surgery it may be brought to your room. For patients admitted to the hospital, discharge time is determined by your treatment team.   Patients discharged the day of surgery will not be allowed to drive home.   Please read over the following fact sheets that you were given:   MRSA Information  __X__ Take these medicines the morning of surgery with A SIP OF WATER:    1. esomeprazole (NEXIUM)  2. finasteride (PROSCAR)  3. gabapentin (NEURONTIN)   4. simvastatin (ZOCOR) if this is usual day to be taken  5. tamsulosin (FLOMAX)  6.  ____ Fleet Enema (as directed)   ____ Use CHG Soap/SAGE wipes as directed  ____ Use inhalers on the day of surgery  __X__ Stop metformin/Janumet/Farxiga 2 days prior to surgery    __X__ Take 1/2 of usual insulin dose the night before surgery. No insulin the morning          of surgery.   ____ Stop Blood Thinners Coumadin/Plavix/Xarelto/Pleta/Pradaxa/Eliquis/Effient/Aspirin  on   Or contact your Surgeon, Cardiologist or Medical Doctor regarding  ability to stop your blood thinners  __X__ Stop Anti-inflammatories 7 days before surgery such as Advil, Ibuprofen, Motrin,  BC or Goodies Powder, Naprosyn, Naproxen, Aleve, Aspirin    __X__ Stop all herbal supplements, fish oil or vitamin E until after surgery.    ____ Bring C-Pap to the hospital.

## 2018-05-18 NOTE — Pre-Procedure Instructions (Signed)
Notified Dr Edwina Barth staff at [facility] of request for medical clearance and to notify me if they have not received it (confirmation of receipt received on my end). Amy at Dr Doristine Counter office also notified and fax sent.

## 2018-05-22 ENCOUNTER — Other Ambulatory Visit: Payer: Self-pay | Admitting: Urology

## 2018-05-22 DIAGNOSIS — E291 Testicular hypofunction: Secondary | ICD-10-CM

## 2018-05-22 DIAGNOSIS — I10 Essential (primary) hypertension: Secondary | ICD-10-CM | POA: Diagnosis not present

## 2018-05-22 DIAGNOSIS — Z01818 Encounter for other preprocedural examination: Secondary | ICD-10-CM | POA: Diagnosis not present

## 2018-05-22 NOTE — Pre-Procedure Instructions (Signed)
MEDICAL CLEARANCE ON CHART 

## 2018-05-25 ENCOUNTER — Other Ambulatory Visit: Payer: Self-pay

## 2018-05-25 ENCOUNTER — Ambulatory Visit: Payer: Medicare HMO | Admitting: Anesthesiology

## 2018-05-25 ENCOUNTER — Encounter: Payer: Self-pay | Admitting: Emergency Medicine

## 2018-05-25 ENCOUNTER — Encounter
Admission: RE | Disposition: A | Discharge status: Home / Self Care | Payer: Self-pay | Source: Home / Self Care | Attending: Urology

## 2018-05-25 ENCOUNTER — Ambulatory Visit
Admission: RE | Admit: 2018-05-25 | Discharge: 2018-05-25 | Disposition: A | Discharge status: Home / Self Care | Payer: Medicare HMO | Attending: Urology | Admitting: Urology

## 2018-05-25 DIAGNOSIS — Z981 Arthrodesis status: Secondary | ICD-10-CM | POA: Insufficient documentation

## 2018-05-25 DIAGNOSIS — N138 Other obstructive and reflux uropathy: Secondary | ICD-10-CM

## 2018-05-25 DIAGNOSIS — N401 Enlarged prostate with lower urinary tract symptoms: Secondary | ICD-10-CM | POA: Diagnosis not present

## 2018-05-25 DIAGNOSIS — K219 Gastro-esophageal reflux disease without esophagitis: Secondary | ICD-10-CM | POA: Diagnosis not present

## 2018-05-25 DIAGNOSIS — N3289 Other specified disorders of bladder: Secondary | ICD-10-CM | POA: Diagnosis not present

## 2018-05-25 DIAGNOSIS — E119 Type 2 diabetes mellitus without complications: Secondary | ICD-10-CM | POA: Diagnosis present

## 2018-05-25 DIAGNOSIS — Z841 Family history of disorders of kidney and ureter: Secondary | ICD-10-CM | POA: Insufficient documentation

## 2018-05-25 DIAGNOSIS — Z833 Family history of diabetes mellitus: Secondary | ICD-10-CM | POA: Insufficient documentation

## 2018-05-25 DIAGNOSIS — N320 Bladder-neck obstruction: Secondary | ICD-10-CM | POA: Insufficient documentation

## 2018-05-25 DIAGNOSIS — E1129 Type 2 diabetes mellitus with other diabetic kidney complication: Secondary | ICD-10-CM | POA: Diagnosis not present

## 2018-05-25 DIAGNOSIS — F419 Anxiety disorder, unspecified: Secondary | ICD-10-CM | POA: Diagnosis present

## 2018-05-25 DIAGNOSIS — I10 Essential (primary) hypertension: Secondary | ICD-10-CM | POA: Insufficient documentation

## 2018-05-25 DIAGNOSIS — E785 Hyperlipidemia, unspecified: Secondary | ICD-10-CM | POA: Insufficient documentation

## 2018-05-25 DIAGNOSIS — Z811 Family history of alcohol abuse and dependence: Secondary | ICD-10-CM | POA: Insufficient documentation

## 2018-05-25 DIAGNOSIS — Z87891 Personal history of nicotine dependence: Secondary | ICD-10-CM | POA: Insufficient documentation

## 2018-05-25 DIAGNOSIS — K5790 Diverticulosis of intestine, part unspecified, without perforation or abscess without bleeding: Secondary | ICD-10-CM | POA: Diagnosis not present

## 2018-05-25 DIAGNOSIS — M4800 Spinal stenosis, site unspecified: Secondary | ICD-10-CM | POA: Insufficient documentation

## 2018-05-25 DIAGNOSIS — Z86010 Personal history of colonic polyps: Secondary | ICD-10-CM | POA: Insufficient documentation

## 2018-05-25 DIAGNOSIS — L719 Rosacea, unspecified: Secondary | ICD-10-CM | POA: Diagnosis not present

## 2018-05-25 HISTORY — PX: HOLEP-LASER ENUCLEATION OF THE PROSTATE WITH MORCELLATION: SHX6641

## 2018-05-25 LAB — GLUCOSE, CAPILLARY
Glucose-Capillary: 108 mg/dL — ABNORMAL HIGH (ref 70–99)
Glucose-Capillary: 167 mg/dL — ABNORMAL HIGH (ref 70–99)

## 2018-05-25 SURGERY — ENUCLEATION, PROSTATE, USING LASER, WITH MORCELLATION
Anesthesia: General

## 2018-05-25 MED ORDER — LIDOCAINE HCL (CARDIAC) PF 100 MG/5ML IV SOSY
PREFILLED_SYRINGE | INTRAVENOUS | Status: DC | PRN
  Administered 2018-05-25: 100 mg via INTRAVENOUS

## 2018-05-25 MED ORDER — CEFAZOLIN SODIUM-DEXTROSE 2-4 GM/100ML-% IV SOLN
INTRAVENOUS | Status: AC
  Filled 2018-05-25: qty 100

## 2018-05-25 MED ORDER — OXYCODONE HCL 5 MG/5ML PO SOLN: 5.0000 mg | Freq: Once | ORAL | Status: DC | PRN

## 2018-05-25 MED ORDER — ONDANSETRON HCL 4 MG/2ML IJ SOLN
INTRAMUSCULAR | Status: DC | PRN
  Administered 2018-05-25: 4 mg via INTRAVENOUS

## 2018-05-25 MED ORDER — FENTANYL CITRATE (PF) 100 MCG/2ML IJ SOLN
INTRAMUSCULAR | Status: AC
  Filled 2018-05-25: qty 2

## 2018-05-25 MED ORDER — SUCCINYLCHOLINE CHLORIDE 20 MG/ML IJ SOLN
INTRAMUSCULAR | Status: DC | PRN
  Administered 2018-05-25: 100 mg via INTRAVENOUS

## 2018-05-25 MED ORDER — PROPOFOL 10 MG/ML IV BOLUS
INTRAVENOUS | Status: AC
  Filled 2018-05-25: qty 20

## 2018-05-25 MED ORDER — MIDAZOLAM HCL 2 MG/2ML IJ SOLN
INTRAMUSCULAR | Status: AC
  Filled 2018-05-25: qty 2

## 2018-05-25 MED ORDER — SEVOFLURANE IN SOLN
RESPIRATORY_TRACT | Status: AC
  Filled 2018-05-25: qty 250

## 2018-05-25 MED ORDER — EPHEDRINE SULFATE 50 MG/ML IJ SOLN
INTRAMUSCULAR | Status: DC | PRN
  Administered 2018-05-25: 15 mg via INTRAVENOUS

## 2018-05-25 MED ORDER — DEXAMETHASONE SODIUM PHOSPHATE 10 MG/ML IJ SOLN
INTRAMUSCULAR | Status: DC | PRN
  Administered 2018-05-25: 5 mg via INTRAVENOUS

## 2018-05-25 MED ORDER — PHENYLEPHRINE HCL 10 MG/ML IJ SOLN
INTRAMUSCULAR | Status: DC | PRN
  Administered 2018-05-25 (×2): 100 ug via INTRAVENOUS

## 2018-05-25 MED ORDER — MIDAZOLAM HCL 2 MG/2ML IJ SOLN
INTRAMUSCULAR | Status: DC | PRN
  Administered 2018-05-25: 2 mg via INTRAVENOUS

## 2018-05-25 MED ORDER — BELLADONNA ALKALOIDS-OPIUM 16.2-60 MG RE SUPP
RECTAL | Status: DC | PRN
  Administered 2018-05-25: 1 via RECTAL

## 2018-05-25 MED ORDER — PROPOFOL 10 MG/ML IV BOLUS
INTRAVENOUS | Status: DC | PRN
  Administered 2018-05-25 (×2): 120 mg via INTRAVENOUS

## 2018-05-25 MED ORDER — OXYCODONE HCL 5 MG PO TABS: 5.0000 mg | ORAL_TABLET | Freq: Once | ORAL | Status: DC | PRN

## 2018-05-25 MED ORDER — SUGAMMADEX SODIUM 200 MG/2ML IV SOLN
INTRAVENOUS | Status: DC | PRN
  Administered 2018-05-25: 160 mg via INTRAVENOUS

## 2018-05-25 MED ORDER — ONDANSETRON HCL 4 MG/2ML IJ SOLN
INTRAMUSCULAR | Status: AC
  Filled 2018-05-25: qty 2

## 2018-05-25 MED ORDER — CEFAZOLIN SODIUM-DEXTROSE 2-4 GM/100ML-% IV SOLN
2.0000 g | INTRAVENOUS | Status: AC
  Administered 2018-05-25: 2 g via INTRAVENOUS

## 2018-05-25 MED ORDER — DEXAMETHASONE SODIUM PHOSPHATE 10 MG/ML IJ SOLN
INTRAMUSCULAR | Status: AC
  Filled 2018-05-25: qty 1

## 2018-05-25 MED ORDER — FENTANYL CITRATE (PF) 100 MCG/2ML IJ SOLN
INTRAMUSCULAR | Status: DC | PRN
  Administered 2018-05-25 (×2): 50 ug via INTRAVENOUS

## 2018-05-25 MED ORDER — SUGAMMADEX SODIUM 200 MG/2ML IV SOLN
INTRAVENOUS | Status: AC
  Filled 2018-05-25: qty 2

## 2018-05-25 MED ORDER — SODIUM CHLORIDE 0.9 % IV SOLN
INTRAVENOUS | Status: DC
  Administered 2018-05-25 (×2): via INTRAVENOUS

## 2018-05-25 MED ORDER — BELLADONNA ALKALOIDS-OPIUM 16.2-60 MG RE SUPP
RECTAL | Status: AC
  Filled 2018-05-25: qty 1

## 2018-05-25 MED ORDER — ACETAMINOPHEN 10 MG/ML IV SOLN
INTRAVENOUS | Status: AC
  Filled 2018-05-25: qty 100

## 2018-05-25 MED ORDER — ROCURONIUM BROMIDE 100 MG/10ML IV SOLN
INTRAVENOUS | Status: DC | PRN
  Administered 2018-05-25: 40 mg via INTRAVENOUS
  Administered 2018-05-25: 10 mg via INTRAVENOUS
  Administered 2018-05-25: 30 mg via INTRAVENOUS
  Administered 2018-05-25 (×2): 10 mg via INTRAVENOUS

## 2018-05-25 MED ORDER — FENTANYL CITRATE (PF) 100 MCG/2ML IJ SOLN: 25.0000 ug | INTRAMUSCULAR | Status: DC | PRN

## 2018-05-25 MED ORDER — ACETAMINOPHEN 10 MG/ML IV SOLN
INTRAVENOUS | Status: DC | PRN
  Administered 2018-05-25: 1000 mg via INTRAVENOUS

## 2018-05-25 SURGICAL SUPPLY — 38 items
ADAPTER IRRIG TUBE 2 SPIKE SOL (ADAPTER) ×4 IMPLANT
ADPR TBG 2 SPK PMP STRL ASCP (ADAPTER) ×2
BAG DRN LRG CPC RND TRDRP CNTR (MISCELLANEOUS)
BAG URINE DRAINAGE (UROLOGICAL SUPPLIES) IMPLANT
BAG URO DRAIN 4000ML (MISCELLANEOUS) IMPLANT
CATH FOL 2WAY LX 20X30 (CATHETERS) IMPLANT
CATH FOL 2WAY LX 22X30 (CATHETERS) IMPLANT
CATH FOLEY 3WAY 30CC 22FR (CATHETERS) IMPLANT
CATH FOLEY 3WAY 30CC 24FR (CATHETERS) ×2
CATH URETL 5X70 OPEN END (CATHETERS) ×2 IMPLANT
CATH URTH STD 24FR FL 3W 2 (CATHETERS) IMPLANT
CONTAINER COLLECT MORCELLATR (MISCELLANEOUS) ×1 IMPLANT
DRAPE SHEET LG 3/4 BI-LAMINATE (DRAPES) ×2 IMPLANT
DRAPE UTILITY 15X26 TOWEL STRL (DRAPES) ×1 IMPLANT
FILTER OVERFLOW MORCELLATOR (FILTER) ×1 IMPLANT
GLOVE BIOGEL PI IND STRL 7.5 (GLOVE) ×1 IMPLANT
GLOVE BIOGEL PI INDICATOR 7.5 (GLOVE) ×1
GOWN STRL REUS W/ TWL LRG LVL3 (GOWN DISPOSABLE) ×1 IMPLANT
GOWN STRL REUS W/ TWL XL LVL3 (GOWN DISPOSABLE) ×1 IMPLANT
GOWN STRL REUS W/TWL LRG LVL3 (GOWN DISPOSABLE) ×2
GOWN STRL REUS W/TWL XL LVL3 (GOWN DISPOSABLE) ×2
GUIDEWIRE STR DUAL SENSOR (WIRE) IMPLANT
HOLDER FOLEY CATH W/STRAP (MISCELLANEOUS) ×2 IMPLANT
KIT TURNOVER CYSTO (KITS) ×2 IMPLANT
LASER FIBER 550M SMARTSCOPE (Laser) ×2 IMPLANT
MORCELLATOR COLLECT CONTAINER (MISCELLANEOUS) ×2
MORCELLATOR OVERFLOW FILTER (FILTER) ×2
MORCELLATOR ROTATION 4.75 335 (MISCELLANEOUS) ×2 IMPLANT
PACK CYSTO AR (MISCELLANEOUS) ×2 IMPLANT
SET CYSTO W/LG BORE CLAMP LF (SET/KITS/TRAYS/PACK) ×2 IMPLANT
SET IRRIG Y TYPE TUR BLADDER L (SET/KITS/TRAYS/PACK) ×2 IMPLANT
SLEEVE PROTECTION STRL DISP (MISCELLANEOUS) ×4 IMPLANT
SOL .9 NS 3000ML IRR  AL (IV SOLUTION) ×12
SOL .9 NS 3000ML IRR AL (IV SOLUTION) ×12
SOL .9 NS 3000ML IRR UROMATIC (IV SOLUTION) ×4 IMPLANT
SYRINGE IRR TOOMEY STRL 70CC (SYRINGE) ×2 IMPLANT
TUBE PUMP MORCELLATOR PIRANHA (TUBING) ×2 IMPLANT
WATER STERILE IRR 1000ML POUR (IV SOLUTION) ×2 IMPLANT

## 2018-05-25 NOTE — Anesthesia Post-op Follow-up Note (Signed)
Anesthesia QCDR form completed.        

## 2018-05-25 NOTE — Op Note (Signed)
Date of procedure: 05/25/18  Preoperative diagnosis:  1. BPH with outlet obstruction, elevated post void residuals  Postoperative diagnosis:  1. Same  Procedure: 1. HoLEP  Surgeon: Nickolas Madrid, MD  Anesthesia: General  Complications: None  Intraoperative findings:  1.  Moderate size prostate with lateral lobe hypertrophy, high bladder neck 2.  Severe bladder trabeculations, multiple small diverticula 3.  Uncomplicated HOLEP  EBL: 20 cc  Specimens: Prostate chips  Enucleation time: 92 minutes  Morcellation time: 5 minutes  Intra-op weight: 37g  Drains: 17F 3-way, 50cc in balloon  Indication: Jacob Perkins. is a 68 y.o. patient with 82 g prostate and BPH with severe outlet obstruction and elevated post void residuals.  After reviewing the management options for treatment, they elected to proceed with the above surgical procedure(s). We have discussed the potential benefits and risks of the procedure, side effects of the proposed treatment, the likelihood of the patient achieving the goals of the procedure, and any potential problems that might occur during the procedure or recuperation.  We specifically discussed the risks of bleeding, infection, hematuria and clot retention, need for additional procedures, possible overnight hospital stay, temporary urgency and urge incontinence, and retrograde ejaculation.  Informed consent has been obtained.   Description of procedure:  The patient was taken to the operating room and general anesthesia was induced.  The patient was placed in the dorsal lithotomy position, prepped and draped in the usual sterile fashion, and preoperative antibiotics were administered.  SCDs were placed for DVT prophylaxis.  A preoperative time-out was performed.   The 50 French continuous flow resectoscope was inserted into the urethra using the visual obturator  The prostate was large with a high bladder neck and lateral lobe hypertrophy. The bladder  was thoroughly inspected and notable for severe bladder trabeculations with multiple bladder diverticula.  The ureteral orifices were located in orthotopic position.  The laser was set to 2 J and 50 Hz and was used to make an incision at the 6 o'clock position to the level of the capsule from the bladder neck to the verumontanum.  The lateral lobes were then incised circumferentially until they were disconnected from the surrounding tissue.  The capsule was examined and laser was used for meticulous hemostasis.  The 84 French resectoscope was then switched out for the 48 French nephroscope and the lobes were morcellated and the tissue sent to pathology.  A 24 French three-way catheter was inserted easily, and CBI was initiated.  50cc were placed in the balloon.  Urine was clear.  The patient tolerated the procedure well without any immediate complications and was extubated and transferred to the recovery room in stable condition.  Urine was clear on medium CBI.  Disposition: Stable to PACU  Plan: Follow-up Monday for voiding trial  Nickolas Madrid, MD

## 2018-05-25 NOTE — Anesthesia Preprocedure Evaluation (Addendum)
Anesthesia Evaluation  Patient identified by MRN, date of birth, ID band Patient awake    Reviewed: Allergy & Precautions, H&P , NPO status , Patient's Chart, lab work & pertinent test results  History of Anesthesia Complications Negative for: history of anesthetic complications  Airway Mallampati: III  TM Distance: <3 FB Neck ROM: limited    Dental  (+) Chipped, Poor Dentition   Pulmonary neg shortness of breath, former smoker,           Cardiovascular Exercise Tolerance: Good hypertension, (-) angina(-) Past MI and (-) DOE + dysrhythmias (RBBB)      Neuro/Psych PSYCHIATRIC DISORDERS  Neuromuscular disease negative psych ROS   GI/Hepatic Neg liver ROS, GERD  Medicated and Controlled,  Endo/Other  diabetes, Type 2  Renal/GU      Musculoskeletal  (+) Arthritis ,   Abdominal   Peds  Hematology negative hematology ROS (+)   Anesthesia Other Findings Past Medical History: No date: Adenomatous polyp No date: Allergy No date: Anxiety No date: BPH (benign prostatic hypertrophy) No date: Diabetes mellitus No date: Diverticulosis No date: GERD (gastroesophageal reflux disease) No date: Hyperlipidemia No date: Hypertension No date: Rosacea No date: Spinal stenosis     Comment:  disc disease  Past Surgical History: No date: ASD REPAIR No date: BACK SURGERY 7/13: L5-S1 Diskectomy     Comment:  Dr Arnoldo Morale No date: LUMBAR FUSION     Comment:  l4-l5 No date: SPINAL CORD DECOMPRESSION  BMI    Body Mass Index:  29.29 kg/m      Reproductive/Obstetrics negative OB ROS                            Anesthesia Physical Anesthesia Plan  ASA: III  Anesthesia Plan: General ETT   Post-op Pain Management:    Induction: Intravenous  PONV Risk Score and Plan: Ondansetron, Dexamethasone, Midazolam and Treatment may vary due to age or medical condition  Airway Management Planned: Oral  ETT  Additional Equipment:   Intra-op Plan:   Post-operative Plan: Extubation in OR  Informed Consent: I have reviewed the patients History and Physical, chart, labs and discussed the procedure including the risks, benefits and alternatives for the proposed anesthesia with the patient or authorized representative who has indicated his/her understanding and acceptance.     Dental Advisory Given  Plan Discussed with: Anesthesiologist, CRNA and Surgeon  Anesthesia Plan Comments: (Patient consented for risks of anesthesia including but not limited to:  - adverse reactions to medications - damage to teeth, lips or other oral mucosa - sore throat or hoarseness - Damage to heart, brain, lungs or loss of life  Patient voiced understanding.)        Anesthesia Quick Evaluation

## 2018-05-25 NOTE — Transfer of Care (Signed)
Immediate Anesthesia Transfer of Care Note  Patient: Jacob Perkins.  Procedure(s) Performed: HOLEP-LASER ENUCLEATION OF THE PROSTATE WITH MORCELLATION (N/A )  Patient Location: PACU  Anesthesia Type:General  Level of Consciousness: drowsy and patient cooperative  Airway & Oxygen Therapy: Patient Spontanous Breathing and Patient connected to face mask oxygen  Post-op Assessment: Report given to RN and Post -op Vital signs reviewed and stable  Post vital signs: Reviewed and stable  Last Vitals:  Vitals Value Taken Time  BP 161/90 05/25/2018  3:53 PM  Temp    Pulse 54 05/25/2018  3:53 PM  Resp 14 05/25/2018  3:53 PM  SpO2 100 % 05/25/2018  3:53 PM  Vitals shown include unvalidated device data.  Last Pain:  Vitals:   05/25/18 1238  TempSrc: Tympanic  PainSc: 0-No pain         Complications: No apparent anesthesia complications

## 2018-05-25 NOTE — Anesthesia Procedure Notes (Addendum)
Procedure Name: Intubation Date/Time: 05/25/2018 1:41 PM Performed by: Jonna Clark, CRNA Pre-anesthesia Checklist: Patient identified, Patient being monitored, Timeout performed, Emergency Drugs available and Suction available Patient Re-evaluated:Patient Re-evaluated prior to induction Oxygen Delivery Method: Circle system utilized Preoxygenation: Pre-oxygenation with 100% oxygen Induction Type: IV induction Ventilation: Mask ventilation without difficulty Laryngoscope Size: 3 and McGraph Grade View: Grade I Tube type: Oral Tube size: 7.5 mm Number of attempts: 1 Airway Equipment and Method: Stylet Placement Confirmation: ETT inserted through vocal cords under direct vision,  positive ETCO2 and breath sounds checked- equal and bilateral Secured at: 21 cm Tube secured with: Tape Dental Injury: Teeth and Oropharynx as per pre-operative assessment

## 2018-05-25 NOTE — H&P (Signed)
Noa Constante. [birth year] [id number]   HPI: Patient is a 68 year old relatively healthy male with long history of urinary symptoms, and bladder outlet obstruction with high pressure low flow voiding and no overactivity on urodynamics.  He also has elevated PVRs from 250-300cc.  Prostate volume is 82 g.  Is elected for HOLEP for definitive management.  He denies chest pain, shortness of breath, fever, or chills.  Urine culture 2/17 shows E. coli, and he has been on culture appropriate antibiotics.   PMH: Past Medical History:  Diagnosis Date  . Adenomatous polyp   . Allergy   . Anxiety   . BPH (benign prostatic hypertrophy)   . Diabetes mellitus   . Diverticulosis   . GERD (gastroesophageal reflux disease)   . Hyperlipidemia   . Hypertension   . Rosacea   . Spinal stenosis    disc disease    Surgical History: Past Surgical History:  Procedure Laterality Date  . ASD REPAIR    . BACK SURGERY    . L5-S1 Diskectomy  7/13   Dr Arnoldo Morale  . LUMBAR FUSION     l4-l5  . SPINAL CORD DECOMPRESSION      Allergies: No Known Allergies  Family History: Family History  Problem Relation Age of Onset  . Diabetes Father   . Urolithiasis Father   . Alcohol abuse Paternal Aunt   . Kidney disease Cousin   . Kidney cancer Neg Hx   . Prostate cancer Neg Hx   . Bladder Cancer Neg Hx     Social History:  reports that he quit smoking about 11 years ago. His smoking use included cigars. He has never used smokeless tobacco. He reports current alcohol use. He reports that he does not use drugs.  ROS: Please see flowsheet from today's date for complete review of systems.  Physical Exam: BP (!) 142/73   Pulse (!) 52   Temp 97.6 F (36.4 C) (Tympanic)   Resp 17   Ht 5\' 5"  (1.651 m)   Wt 79.8 kg   SpO2 97%   BMI 29.29 kg/m    Constitutional:  Alert and oriented, No acute distress. Cardiovascular: Regular rate and rhythm Respiratory: Clear to auscultation  bilaterally GI: Abdomen is soft, nontender, nondistended, no abdominal masses GU: No CVA tenderness Lymph: No cervical or inguinal lymphadenopathy. Skin: No rashes, bruises or suspicious lesions. Neurologic: Grossly intact, no focal deficits, moving all 4 extremities. Psychiatric: Normal mood and affect.  Laboratory Data: PSA 0.9(1.8 on finasteride)   Assessment & Plan:   Healthy 68 year old male with long history of bladder outlet obstruction confirmed on urodynamics.  82 g prostate.  We discussed the risks and benefits of HOLEP at length.  The procedure requires general anesthesia and takes 2 to 3 hours, and a holmium laser is used to enucleate the prostate and push this tissue into the bladder.  A morcellator is then used to remove this tissue, which is sent for pathology.  Majority of patients are able to discharge the same day with a catheter in place for 2 to 3 days, and will follow-up in clinic for a voiding trial.  Approximately 10% of patients will be admitted overnight to monitor the urine, or if they have multiple comorbidities.  We specifically discussed the risks of bleeding, infection, retrograde ejaculation, temporary urgency and urge incontinence, very low risk of long-term incontinence, and possible need for additional procedures.  Nickolas Madrid, MD 05/25/2018  [facility] [address]  [place], [place] [address] [phone]

## 2018-05-25 NOTE — Progress Notes (Signed)
Photo of bloody urine sent to Dr. Diamantina Providence , urine will clear , drink plenty of fluids .

## 2018-05-25 NOTE — Discharge Instructions (Signed)
Indwelling Urinary Catheter Care, Adult An indwelling urinary catheter is a thin tube that is put into your bladder. The tube helps to drain pee (urine) out of your body. The tube goes in through your urethra. Your urethra is where pee comes out of your body. Your pee will come out through the catheter, then it will go into a bag (drainage bag). Take good care of your catheter so it will work well. How to wear your catheter and bag Supplies needed  Sticky tape (adhesive tape) or a leg strap.  Alcohol wipe or soap and water (if you use tape).  A clean towel (if you use tape).  Large overnight bag.  Smaller bag (leg bag). Wearing your catheter Attach your catheter to your leg with tape or a leg strap.  Make sure the catheter is not pulled tight.  If a leg strap gets wet, take it off and put on a dry strap.  If you use tape to hold the bag on your leg: 1. Use an alcohol wipe or soap and water to wash your skin where the tape made it sticky before. 2. Use a clean towel to pat-dry that skin. 3. Use new tape to make the bag stay on your leg. Wearing your bags You should have been given a large overnight bag.  You may wear the overnight bag in the day or night.  Always have the overnight bag lower than your bladder.  Do not let the bag touch the floor.  Before you go to sleep, put a clean plastic bag in a wastebasket. Then hang the overnight bag inside the wastebasket. You should also have a smaller leg bag that fits under your clothes.  Always wear the leg bag below your knee.  Do not wear your leg bag at night. How to care for your skin and catheter Supplies needed  A clean washcloth.  Water and mild soap.  A clean towel. Caring for your skin and catheter      Clean the skin around your catheter every day: ? Wash your hands with soap and water. ? Wet a clean washcloth in warm water and mild soap. ? Clean the skin around your urethra. ? If you are male: ? Gently  spread the folds of skin around your vagina (labia). ? With the washcloth in your other hand, wipe the inner side of your labia on each side. Wipe from front to back. ? If you are male: ? Pull back any skin that covers the end of your penis (foreskin). ? With the washcloth in your other hand, wipe your penis in small circles. Start wiping at the tip of your penis, then move away from the catheter. ? With your free hand, hold the catheter close to where it goes into your body. ? Keep holding the catheter during cleaning so it does not get pulled out. ? With the washcloth in your other hand, clean the catheter. ? Only wipe downward on the catheter. ? Do not wipe upward toward your body. Doing this may push germs into your urethra and cause infection. ? Use a clean towel to pat-dry the catheter and the skin around it. Make sure to wipe off all soap. ? Wash your hands with soap and water.  Shower every day. Do not take baths.  Do not use cream, ointment, or lotion on the area where the catheter goes into your body, unless your doctor tells you to.  Do not use powders, sprays, or lotions  on your genital area.  Check your skin around the catheter every day for signs of infection. Check for: ? Redness, swelling, or pain. ? Fluid or blood. ? Warmth. ? Pus or a bad smell. How to empty the bag Supplies needed  Rubbing alcohol.  Gauze pad or cotton ball.  Tape or a leg strap. Emptying the bag Pour the pee out of your bag when it is ?- full, or at least 2-3 times a day. Do this for your overnight bag and your leg bag. 1. Wash your hands with soap and water. 2. Separate (detach) the bag from your leg. 3. Hold the bag over the toilet or a clean pail. Keep the bag lower than your hips and bladder. This is so the pee (urine) does not go back into the tube. 4. Open the pour spout. It is at the bottom of the bag. 5. Empty the pee into the toilet or pail. Do not let the pour spout touch any  surface. 6. Put rubbing alcohol on a gauze pad or cotton ball. 7. Use the gauze pad or cotton ball to clean the pour spout. 8. Close the pour spout. 9. Attach the bag to your leg with tape or a leg strap. 10. Wash your hands with soap and water. Follow instructions for cleaning the drainage bag:  From the product maker.  As told by your doctor. How to change the bag Supplies needed  Alcohol wipes.  A clean bag.  Tape or a leg strap. Changing the bag Replace your bag with a clean bag once a month. If it starts to leak, smell bad, or look dirty, change it sooner. 1. Wash your hands with soap and water. 2. Separate the dirty bag from your leg. 3. Pinch the catheter with your fingers so that pee does not spill out. 4. Separate the catheter tube from the bag tube where these tubes connect (at the connection valve). Do not let the tubes touch any surface. 5. Clean the end of the catheter tube with an alcohol wipe. Use a different alcohol wipe to clean the end of the bag tube. 6. Connect the catheter tube to the tube of the clean bag. 7. Attach the clean bag to your leg with tape or a leg strap. Do not make the bag tight on your leg. 8. Wash your hands with soap and water. General rules   Never pull on your catheter. Never try to take it out. Doing that can hurt you.  Always wash your hands before and after you touch your catheter or bag. Use a mild, fragrance-free soap. If you do not have soap and water, use hand sanitizer.  Always make sure there are no twists or bends (kinks) in the catheter tube.  Always make sure there are no leaks in the catheter or bag.  Drink enough fluid to keep your pee pale yellow.  Do not take baths, swim, or use a hot tub.  If you are male, wipe from front to back after you poop (have a bowel movement). Contact a doctor if:  Your pee is cloudy.  Your pee smells worse than usual.  Your catheter gets clogged.  Your catheter leaks.  Your  bladder feels full. Get help right away if:  You have redness, swelling, or pain where the catheter goes into your body.  You have fluid, blood, pus, or a bad smell coming from the area where the catheter goes into your body.  Your skin feels warm  where the catheter goes into your body.  You have a fever.  You have pain in your: ? Belly (abdomen). ? Legs. ? Lower back. ? Bladder.  You see blood in the catheter.  Your pee is pink or red.  You feel sick to your stomach (nauseous).  You throw up (vomit).  You have chills.  Your pee is not draining into the bag.  Your catheter gets pulled out. Summary  An indwelling urinary catheter is a thin tube that is placed into the bladder to help drain pee (urine) out of the body.  The catheter is placed into the part of the body that drains pee from the bladder (urethra).  Taking good care of your catheter will keep it working properly and help prevent problems.  Always wash your hands before and after touching your catheter or bag.  Never pull on your catheter or try to take it out. This information is not intended to replace advice given to you by your health care provider. Make sure you discuss any questions you have with your health care provider. Document Released: 07/09/2012 Document Revised: 09/04/2017 Document Reviewed: 10/28/2016 Elsevier Interactive Patient Education  2019 [place]   1) The drugs that you were given will stay in your system until tomorrow so for the next 24 hours you should not:  A) Drive an automobile B) Make any legal decisions C) Drink any alcoholic beverage   2) You may resume regular meals tomorrow.  Today it is better to start with liquids and gradually work up to solid foods.  You may eat anything you prefer, but it is better to start with liquids, then soup and crackers, and gradually work up to solid foods.   3) Please notify your  doctor immediately if you have any unusual bleeding, trouble breathing, redness and pain at the surgery site, drainage, fever, or pain not relieved by medication. 4)   5) Your post-operative visit with Dr.                                     is: Date:                        Time:    Please call to schedule your post-operative visit.  6) Additional Instructions:

## 2018-05-26 ENCOUNTER — Encounter: Payer: Self-pay | Admitting: Urology

## 2018-05-28 ENCOUNTER — Ambulatory Visit: Payer: Medicare HMO

## 2018-05-28 DIAGNOSIS — N138 Other obstructive and reflux uropathy: Secondary | ICD-10-CM

## 2018-05-28 DIAGNOSIS — N401 Enlarged prostate with lower urinary tract symptoms: Secondary | ICD-10-CM

## 2018-05-28 NOTE — Progress Notes (Signed)
Catheter Removal  Patient is present today for a catheter removal.  72ml of water was drained from the balloon. A 24FR foley cath was removed from the bladder no complications were noted . Patient tolerated well.  Preformed by: Fonnie Jarvis, CMA  Follow up/ Additional notes: keep post op apt

## 2018-05-30 LAB — SURGICAL PATHOLOGY

## 2018-05-31 ENCOUNTER — Telehealth: Payer: Self-pay

## 2018-05-31 NOTE — Telephone Encounter (Signed)
mychart sent.

## 2018-05-31 NOTE — Telephone Encounter (Signed)
-----   Message from Billey Co, MD sent at 05/31/2018  8:02 AM EST ----- No prostate cancer seen in prostate chips from surgery, keep follow up as scheduled.  Nickolas Madrid, MD 05/31/2018

## 2018-06-02 NOTE — Anesthesia Postprocedure Evaluation (Signed)
Anesthesia Post Note  Patient: Jacob Perkins.  Procedure(s) Performed: HOLEP-LASER ENUCLEATION OF THE PROSTATE WITH MORCELLATION (N/A )  Patient location during evaluation: PACU Anesthesia Type: General Level of consciousness: awake and alert Pain management: pain level controlled Vital Signs Assessment: post-procedure vital signs reviewed and stable Respiratory status: spontaneous breathing, nonlabored ventilation, respiratory function stable and patient connected to nasal cannula oxygen Cardiovascular status: blood pressure returned to baseline and stable Postop Assessment: no apparent nausea or vomiting Anesthetic complications: no     Last Vitals:  Vitals:   05/25/18 1700 05/25/18 1743  BP: (!) 146/48 (!) 144/70  Pulse: (!) 36 (!) 40  Resp:  14  Temp: (!) 36.2 C   SpO2: 96% 96%    Last Pain:  Vitals:   05/28/18 0824  TempSrc:   PainSc: 1                  Molli Barrows

## 2018-06-18 DIAGNOSIS — R69 Illness, unspecified: Secondary | ICD-10-CM | POA: Diagnosis not present

## 2018-06-28 ENCOUNTER — Ambulatory Visit (INDEPENDENT_AMBULATORY_CARE_PROVIDER_SITE_OTHER): Payer: Medicare HMO | Admitting: Urology

## 2018-06-28 ENCOUNTER — Other Ambulatory Visit: Payer: Self-pay

## 2018-06-28 ENCOUNTER — Encounter: Payer: Self-pay | Admitting: Urology

## 2018-06-28 VITALS — BP 174/90 | HR 53 | Ht 65.0 in | Wt 179.0 lb

## 2018-06-28 DIAGNOSIS — N401 Enlarged prostate with lower urinary tract symptoms: Secondary | ICD-10-CM

## 2018-06-28 DIAGNOSIS — N138 Other obstructive and reflux uropathy: Secondary | ICD-10-CM

## 2018-06-28 LAB — BLADDER SCAN AMB NON-IMAGING

## 2018-06-28 NOTE — Progress Notes (Signed)
   06/28/2018 2:16 PM   Jacob Pat Jr. [birth year] [id number]  Reason for visit: Follow up s/p HoLEP 05/25/2018  HPI: I saw Jacob Perkins back in urology clinic today after HOLEP.  Briefly, he is a 68 year old male with long history of moderate to severe urinary symptoms and bladder outlet obstruction demonstrated on urodynamics.  Preoperatively he had post void residuals around 300 cc, history of urinary tract infections, and a prostate volume of 82 g.  HOLEP pathology showed 37 g of benign tissue.  He is doing extremely well since surgery.  He denies any hematuria or difficulty voiding.  He is voiding with a very strong stream and feels he is emptying completely.  He denies any urgency or incontinence.  PVR 10cc, IPSS score 3 with QOL delighted.  68 year old male doing great 1 month out from [facility].  RTC 6 months with PVR  A total of 10 minutes were spent face-to-face with the patient, greater than 50% was spent in patient education, counseling, and coordination of care regarding postop HOLEP expectations.  Jacob Perkins, [facility] [address] [place], [place] [address] [phone]

## 2018-07-02 ENCOUNTER — Ambulatory Visit: Payer: Medicare HMO | Admitting: Urology

## 2018-07-10 ENCOUNTER — Ambulatory Visit: Admission: RE | Admit: 2018-07-10 | Payer: Medicare HMO | Source: Home / Self Care | Admitting: Internal Medicine

## 2018-07-10 ENCOUNTER — Encounter: Admission: RE | Payer: Self-pay | Source: Home / Self Care

## 2018-07-10 SURGERY — COLONOSCOPY WITH PROPOFOL
Anesthesia: General

## 2018-07-17 DIAGNOSIS — Z794 Long term (current) use of insulin: Secondary | ICD-10-CM | POA: Diagnosis not present

## 2018-07-17 DIAGNOSIS — E119 Type 2 diabetes mellitus without complications: Secondary | ICD-10-CM | POA: Diagnosis not present

## 2018-07-23 DIAGNOSIS — R69 Illness, unspecified: Secondary | ICD-10-CM | POA: Diagnosis not present

## 2018-07-24 DIAGNOSIS — R809 Proteinuria, unspecified: Secondary | ICD-10-CM | POA: Diagnosis not present

## 2018-07-24 DIAGNOSIS — Z794 Long term (current) use of insulin: Secondary | ICD-10-CM | POA: Diagnosis not present

## 2018-07-24 DIAGNOSIS — E119 Type 2 diabetes mellitus without complications: Secondary | ICD-10-CM | POA: Diagnosis not present

## 2018-07-24 DIAGNOSIS — E1129 Type 2 diabetes mellitus with other diabetic kidney complication: Secondary | ICD-10-CM | POA: Diagnosis not present

## 2018-08-07 DIAGNOSIS — L821 Other seborrheic keratosis: Secondary | ICD-10-CM | POA: Diagnosis not present

## 2018-08-07 DIAGNOSIS — D1800 Hemangioma unspecified site: Secondary | ICD-10-CM | POA: Diagnosis not present

## 2018-08-07 DIAGNOSIS — D229 Melanocytic nevi, unspecified: Secondary | ICD-10-CM | POA: Diagnosis not present

## 2018-08-07 DIAGNOSIS — D225 Melanocytic nevi of trunk: Secondary | ICD-10-CM | POA: Diagnosis not present

## 2018-08-07 DIAGNOSIS — Z1283 Encounter for screening for malignant neoplasm of skin: Secondary | ICD-10-CM | POA: Diagnosis not present

## 2018-08-07 DIAGNOSIS — L219 Seborrheic dermatitis, unspecified: Secondary | ICD-10-CM | POA: Diagnosis not present

## 2018-08-07 DIAGNOSIS — L718 Other rosacea: Secondary | ICD-10-CM | POA: Diagnosis not present

## 2018-09-18 ENCOUNTER — Other Ambulatory Visit: Payer: Self-pay | Admitting: Urology

## 2018-09-18 DIAGNOSIS — E291 Testicular hypofunction: Secondary | ICD-10-CM

## 2018-09-18 DIAGNOSIS — R69 Illness, unspecified: Secondary | ICD-10-CM | POA: Diagnosis not present

## 2018-09-18 NOTE — Telephone Encounter (Signed)
Ok to refill 

## 2018-09-26 NOTE — Telephone Encounter (Signed)
Spoke to pharmacist at [facility] and confirmed that it was okay to refill the Clomid Rx.

## 2018-10-01 DIAGNOSIS — E7800 Pure hypercholesterolemia, unspecified: Secondary | ICD-10-CM | POA: Diagnosis not present

## 2018-10-01 DIAGNOSIS — E119 Type 2 diabetes mellitus without complications: Secondary | ICD-10-CM | POA: Diagnosis not present

## 2018-10-08 DIAGNOSIS — I10 Essential (primary) hypertension: Secondary | ICD-10-CM | POA: Diagnosis not present

## 2018-10-08 DIAGNOSIS — Z0000 Encounter for general adult medical examination without abnormal findings: Secondary | ICD-10-CM | POA: Diagnosis not present

## 2018-10-08 DIAGNOSIS — E119 Type 2 diabetes mellitus without complications: Secondary | ICD-10-CM | POA: Diagnosis not present

## 2018-10-08 DIAGNOSIS — E1129 Type 2 diabetes mellitus with other diabetic kidney complication: Secondary | ICD-10-CM | POA: Diagnosis not present

## 2018-10-08 DIAGNOSIS — Z0001 Encounter for general adult medical examination with abnormal findings: Secondary | ICD-10-CM | POA: Diagnosis not present

## 2018-10-08 DIAGNOSIS — N400 Enlarged prostate without lower urinary tract symptoms: Secondary | ICD-10-CM | POA: Diagnosis not present

## 2018-10-12 ENCOUNTER — Other Ambulatory Visit: Payer: Self-pay

## 2018-10-12 ENCOUNTER — Other Ambulatory Visit
Admission: RE | Admit: 2018-10-12 | Discharge: 2018-10-12 | Disposition: A | Discharge status: Home / Self Care | Payer: Medicare HMO | Source: Ambulatory Visit | Attending: Internal Medicine | Admitting: Internal Medicine

## 2018-10-12 DIAGNOSIS — Z1159 Encounter for screening for other viral diseases: Secondary | ICD-10-CM | POA: Diagnosis not present

## 2018-10-12 LAB — SARS CORONAVIRUS 2 (TAT 6-24 HRS): SARS Coronavirus 2: NEGATIVE

## 2018-10-15 ENCOUNTER — Encounter: Payer: Self-pay | Admitting: *Deleted

## 2018-10-16 ENCOUNTER — Encounter
Admission: RE | Disposition: A | Discharge status: Home / Self Care | Payer: Self-pay | Source: Home / Self Care | Attending: Internal Medicine

## 2018-10-16 ENCOUNTER — Encounter: Payer: Self-pay | Admitting: *Deleted

## 2018-10-16 ENCOUNTER — Ambulatory Visit: Payer: Medicare HMO | Admitting: Certified Registered Nurse Anesthetist

## 2018-10-16 ENCOUNTER — Ambulatory Visit
Admission: RE | Admit: 2018-10-16 | Discharge: 2018-10-16 | Disposition: A | Discharge status: Home / Self Care | Payer: Medicare HMO | Attending: Internal Medicine | Admitting: Internal Medicine

## 2018-10-16 DIAGNOSIS — E119 Type 2 diabetes mellitus without complications: Secondary | ICD-10-CM | POA: Diagnosis not present

## 2018-10-16 DIAGNOSIS — E785 Hyperlipidemia, unspecified: Secondary | ICD-10-CM | POA: Diagnosis not present

## 2018-10-16 DIAGNOSIS — K219 Gastro-esophageal reflux disease without esophagitis: Secondary | ICD-10-CM | POA: Diagnosis not present

## 2018-10-16 DIAGNOSIS — K5730 Diverticulosis of large intestine without perforation or abscess without bleeding: Secondary | ICD-10-CM | POA: Insufficient documentation

## 2018-10-16 DIAGNOSIS — I10 Essential (primary) hypertension: Secondary | ICD-10-CM | POA: Insufficient documentation

## 2018-10-16 DIAGNOSIS — Z7951 Long term (current) use of inhaled steroids: Secondary | ICD-10-CM | POA: Diagnosis not present

## 2018-10-16 DIAGNOSIS — Z86010 Personal history of colonic polyps: Secondary | ICD-10-CM | POA: Diagnosis not present

## 2018-10-16 DIAGNOSIS — Z794 Long term (current) use of insulin: Secondary | ICD-10-CM | POA: Insufficient documentation

## 2018-10-16 DIAGNOSIS — Z87891 Personal history of nicotine dependence: Secondary | ICD-10-CM | POA: Diagnosis not present

## 2018-10-16 DIAGNOSIS — Z79899 Other long term (current) drug therapy: Secondary | ICD-10-CM | POA: Diagnosis not present

## 2018-10-16 DIAGNOSIS — Z7982 Long term (current) use of aspirin: Secondary | ICD-10-CM | POA: Diagnosis not present

## 2018-10-16 DIAGNOSIS — N400 Enlarged prostate without lower urinary tract symptoms: Secondary | ICD-10-CM | POA: Diagnosis not present

## 2018-10-16 DIAGNOSIS — E1129 Type 2 diabetes mellitus with other diabetic kidney complication: Secondary | ICD-10-CM | POA: Diagnosis not present

## 2018-10-16 DIAGNOSIS — K648 Other hemorrhoids: Secondary | ICD-10-CM | POA: Diagnosis not present

## 2018-10-16 DIAGNOSIS — K640 First degree hemorrhoids: Secondary | ICD-10-CM | POA: Insufficient documentation

## 2018-10-16 DIAGNOSIS — K5790 Diverticulosis of intestine, part unspecified, without perforation or abscess without bleeding: Secondary | ICD-10-CM | POA: Diagnosis not present

## 2018-10-16 DIAGNOSIS — Z1211 Encounter for screening for malignant neoplasm of colon: Secondary | ICD-10-CM | POA: Diagnosis not present

## 2018-10-16 HISTORY — PX: COLONOSCOPY WITH PROPOFOL: SHX5780

## 2018-10-16 LAB — GLUCOSE, CAPILLARY
Glucose-Capillary: 73 mg/dL (ref 70–99)
Glucose-Capillary: 132 mg/dL — ABNORMAL HIGH (ref 70–99)
Glucose-Capillary: 92 mg/dL (ref 70–99)

## 2018-10-16 SURGERY — COLONOSCOPY WITH PROPOFOL
Anesthesia: General

## 2018-10-16 MED ORDER — PROPOFOL 500 MG/50ML IV EMUL
INTRAVENOUS | Status: DC | PRN
  Administered 2018-10-16: 130 ug/kg/min via INTRAVENOUS

## 2018-10-16 MED ORDER — DEXTROSE 50 % IV SOLN
INTRAVENOUS | Status: AC
  Administered 2018-10-16: 25 mL via INTRAVENOUS
  Filled 2018-10-16: qty 50

## 2018-10-16 MED ORDER — PROPOFOL 10 MG/ML IV BOLUS
INTRAVENOUS | Status: DC | PRN
  Administered 2018-10-16: 80 mg via INTRAVENOUS

## 2018-10-16 MED ORDER — LIDOCAINE HCL (PF) 2 % IJ SOLN
INTRAMUSCULAR | Status: AC
  Filled 2018-10-16: qty 10

## 2018-10-16 MED ORDER — SODIUM CHLORIDE 0.9 % IV SOLN
INTRAVENOUS | Status: DC
  Administered 2018-10-16: 1000 mL via INTRAVENOUS

## 2018-10-16 MED ORDER — PROPOFOL 500 MG/50ML IV EMUL
INTRAVENOUS | Status: AC
  Filled 2018-10-16: qty 50

## 2018-10-16 MED ORDER — LIDOCAINE HCL (CARDIAC) PF 100 MG/5ML IV SOSY
PREFILLED_SYRINGE | INTRAVENOUS | Status: DC | PRN
  Administered 2018-10-16: 50 mg via INTRAVENOUS

## 2018-10-16 MED ORDER — DEXTROSE 50 % IV SOLN
25.0000 mL | Freq: Once | INTRAVENOUS | Status: AC
  Administered 2018-10-16: 08:00:00 25 mL via INTRAVENOUS

## 2018-10-16 NOTE — Interval H&P Note (Signed)
History and Physical Interval Note:  10/16/2018 8:14 AM  Jacob Perkins.  has presented today for surgery, with the diagnosis of PHCP.  The various methods of treatment have been discussed with the patient and family. After consideration of risks, benefits and other options for treatment, the patient has consented to  Procedure(s): COLONOSCOPY WITH PROPOFOL (N/A) as a surgical intervention.  The patient's history has been reviewed, patient examined, no change in status, stable for surgery.  I have reviewed the patient's chart and labs.  Questions were answered to the patient's satisfaction.     [place], [place]

## 2018-10-16 NOTE — Transfer of Care (Signed)
Immediate Anesthesia Transfer of Care Note  Patient: Jacob Perkins.  Procedure(s) Performed: COLONOSCOPY WITH PROPOFOL (N/A )  Patient Location: PACU and Endoscopy Unit  Anesthesia Type:General  Level of Consciousness: drowsy  Airway & Oxygen Therapy: Patient Spontanous Breathing  Post-op Assessment: Report given to RN and Post -op Vital signs reviewed and stable  Post vital signs: Reviewed and stable  Last Vitals:  Vitals Value Taken Time  BP 105/55 10/16/18 0839  Temp    Pulse 47 10/16/18 0839  Resp 16 10/16/18 0839  SpO2 98 % 10/16/18 0839  Vitals shown include unvalidated device data.  Last Pain:  Vitals:   10/16/18 0713  TempSrc: Tympanic  PainSc: 0-No pain         Complications: No apparent anesthesia complications

## 2018-10-16 NOTE — H&P (Signed)
Outpatient short stay form Pre-procedure 10/16/2018 8:13 AM Charene Mccallister K. Alice Reichert, M.D.  Primary Physician: Harrel Lemon, M.D.  Reason for visit:  Personal hx of colon polyps  History of present illness:                            Patient presents for colonoscopy for a personal hx of colon polyps. The patient denies abdominal pain, abnormal weight loss or rectal bleeding.     Current Facility-Administered Medications:  .  0.9 %  sodium chloride infusion, , Intravenous, Continuous, [place], Benay Pike, MD, Last Rate: 20 mL/hr at 10/16/18 0730, 1,000 mL at 10/16/18 0730  Medications Prior to Admission  Medication Sig Dispense Refill Last Dose  . finasteride (PROSCAR) 5 MG tablet Take 5 mg by mouth daily.     . ramipril (ALTACE) 5 MG capsule TAKE ONE CAPSULE DAILY (Patient taking differently: Take 5 mg by mouth daily. ) 30 capsule 0 10/16/2018 at Unknown time  . Ascorbic Acid (VITAMIN C) 1000 MG tablet Take 1,000 mg by mouth daily.      Marland Kitchen aspirin 81 MG tablet Take 81 mg by mouth at bedtime.      . B-D ULTRAFINE III SHORT PEN 31G X 8 MM MISC USE AS DIRECTED 100 each 1   . clomiPHENE (CLOMID) 50 MG tablet Take 1 tablet (50 mg total) by mouth daily. 30 tablet 3   . Dulaglutide (TRULICITY) 1.5 IH/4.7QQ SOPN Inject 1.5 mg into the skin every 7 (seven) days. Thursday am     . esomeprazole (NEXIUM) 40 MG capsule Take 40 mg by mouth every other day.      . fish oil-omega-3 fatty acids 1000 MG capsule Take 1 g by mouth 2 (two) times daily.       . fluticasone (FLONASE) 50 MCG/ACT nasal spray USE 2 SPRAYS IN EACH NOSTRIL            EVERY DAY (Patient taking differently: Place 2 sprays into both nostrils daily as needed. ) 16 g 1   . gabapentin (NEURONTIN) 300 MG capsule Take 300 mg by mouth 2 (two) times daily.      Marland Kitchen GLUCOSAMINE-CHONDROITIN PO Take 1 tablet by mouth 2 (two) times daily.      Marland Kitchen ibuprofen (ADVIL,MOTRIN) 200 MG tablet Take 400-600 mg by mouth every 6 (six) hours as needed for headache or  moderate pain.     Marland Kitchen ketotifen (ZADITOR) 0.025 % ophthalmic solution Place 1 drop into both eyes daily.     Marland Kitchen LEVEMIR FLEXTOUCH 100 UNIT/ML Pen Inject 50 Units into the skin at bedtime.      . Melatonin 10 MG TABS Take 10 mg by mouth at bedtime.     . metFORMIN (GLUCOPHAGE) 1000 MG tablet TAKE ONE TABLET TWICE A DAY (Patient taking differently: Take 1,000 mg by mouth 2 (two) times daily with a meal. ) 60 tablet 11   . Multiple Vitamins-Minerals (MULTI-BETIC DIABETES) TABS Take 2 tablets by mouth daily.     . Naproxen Sod-diphenhydrAMINE (ALEVE PM) 220-25 MG TABS Take 1 tablet by mouth at bedtime.     . ONE TOUCH ULTRA TEST test strip USE AS DIRECTED 100 each 11   . simvastatin (ZOCOR) 40 MG tablet TAKE 1 TABLET EVERY DAY (Patient taking differently: Take 40 mg by mouth every other day. ) 30 tablet 11   . Trospium Chloride 60 MG CP24         No  Known Allergies   Past Medical History:  Diagnosis Date  . Adenomatous polyp   . Allergy   . Anxiety   . BPH (benign prostatic hypertrophy)   . Diabetes mellitus   . Diverticulosis   . GERD (gastroesophageal reflux disease)   . Hyperlipidemia   . Hypertension   . Rosacea   . Spinal stenosis    disc disease    Review of systems:  Otherwise negative.    Physical Exam  Gen: Alert, oriented. Appears stated age.  HEENT: [place]/AT. PERRLA. Lungs: CTA, no wheezes. CV: RR nl S1, S2. Abd: soft, benign, no masses. BS+ Ext: No edema. Pulses 2+    Planned procedures: Proceed with colonoscopy. The patient understands the nature of the planned procedure, indications, risks, alternatives and potential complications including but not limited to bleeding, infection, perforation, damage to internal organs and possible oversedation/side effects from anesthesia. The patient agrees and gives consent to proceed.  Please refer to procedure notes for findings, recommendations and patient disposition/instructions.     Chima Astorino K. Alice Reichert,  M.D. Gastroenterology 10/16/2018  8:13 AM

## 2018-10-16 NOTE — Op Note (Signed)
[facility] Gastroenterology Patient Name: Tabias Swayze Procedure Date: 10/16/2018 7:19 AM MRN: [id number] Account #: [id number] Date of Birth: Oct 06, 1950 Admit Type: Outpatient Age: 68 Room: [facility] ENDO ROOM 3 Gender: Male Note Status: Finalized Procedure:            Colonoscopy Indications:          High risk colon cancer surveillance: Personal history                        of colonic polyps Providers:            Benay Pike. Alice Reichert MD, MD Referring MD:         Baxter Hire, MD (Referring MD) Medicines:            Propofol per Anesthesia Complications:        No immediate complications. Procedure:            Pre-Anesthesia Assessment:                       - The risks and benefits of the procedure and the                        sedation options and risks were discussed with the                        patient. All questions were answered and informed                        consent was obtained.                       - Patient identification and proposed procedure were                        verified prior to the procedure by the nurse.                       - ASA Grade Assessment: III - A patient with severe                        systemic disease.                       - After reviewing the risks and benefits, the patient                        was deemed in satisfactory condition to undergo the                        procedure.                       After obtaining informed consent, the colonoscope was                        passed under direct vision. Throughout the procedure,                        the patient's blood pressure, pulse, and oxygen  saturations were monitored continuously. The                        Colonoscope was introduced through the anus and                        advanced to the the cecum, identified by appendiceal                        orifice and ileocecal valve. The colonoscopy was   performed without difficulty. The patient tolerated the                        procedure well. The quality of the bowel preparation                        was excellent. The ileocecal valve, appendiceal                        orifice, and rectum were photographed. Findings:      The perianal and digital rectal examinations were normal. Pertinent       negatives include normal sphincter tone and no palpable rectal lesions.      Non-bleeding internal hemorrhoids were found during retroflexion. The       hemorrhoids were Grade I (internal hemorrhoids that do not prolapse).      Many small and large-mouthed diverticula were found in the sigmoid colon.      The exam was otherwise without abnormality. Impression:           - Non-bleeding internal hemorrhoids.                       - Diverticulosis in the sigmoid colon.                       - The examination was otherwise normal.                       - No specimens collected. Recommendation:       - Patient has a contact number available for                        emergencies. The signs and symptoms of potential                        delayed complications were discussed with the patient.                        Return to normal activities tomorrow. Written discharge                        instructions were provided to the patient.                       - Resume previous diet.                       - Continue present medications.                       - Repeat colonoscopy in 5 years for surveillance.                       -  The findings and recommendations were discussed with                        the patient. Procedure Code(s):    --- Professional ---                       V7793, Colorectal cancer screening; colonoscopy on                        individual at high risk Diagnosis Code(s):    --- Professional ---                       K57.30, Diverticulosis of large intestine without                        perforation or abscess without  bleeding                       K64.0, First degree hemorrhoids                       Z86.010, Personal history of colonic polyps CPT copyright 2019 American Medical Association. All rights reserved. The codes documented in this report are preliminary and upon coder review may  be revised to meet current compliance requirements. Efrain Sella MD, MD 10/16/2018 8:37:12 AM This report has been signed electronically. Number of Addenda: 0 Note Initiated On: 10/16/2018 7:19 AM Scope Withdrawal Time: 0 hours 6 minutes 32 seconds  Total Procedure Duration: 0 hours 12 minutes 38 seconds  Estimated Blood Loss: Estimated blood loss: none.      [facility]

## 2018-10-16 NOTE — Anesthesia Preprocedure Evaluation (Signed)
Anesthesia Evaluation  Patient identified by MRN, date of birth, ID band Patient awake    Reviewed: Allergy & Precautions, H&P , NPO status , Patient's Chart, lab work & pertinent test results  History of Anesthesia Complications Negative for: history of anesthetic complications  Airway Mallampati: III  TM Distance: <3 FB Neck ROM: limited    Dental  (+) Chipped, Poor Dentition   Pulmonary neg shortness of breath, former smoker,           Cardiovascular Exercise Tolerance: Good hypertension, (-) angina(-) Past MI and (-) DOE + dysrhythmias (RBBB)      Neuro/Psych PSYCHIATRIC DISORDERS  Neuromuscular disease negative psych ROS   GI/Hepatic Neg liver ROS, GERD  Medicated and Controlled,  Endo/Other  diabetes, Type 2  Renal/GU negative Renal ROS     Musculoskeletal  (+) Arthritis ,   Abdominal   Peds  Hematology negative hematology ROS (+)   Anesthesia Other Findings Past Medical History: No date: Adenomatous polyp No date: Allergy No date: Anxiety No date: BPH (benign prostatic hypertrophy) No date: Diabetes mellitus No date: Diverticulosis No date: GERD (gastroesophageal reflux disease) No date: Hyperlipidemia No date: Hypertension No date: Rosacea No date: Spinal stenosis     Comment:  disc disease  Past Surgical History: No date: ASD REPAIR No date: BACK SURGERY 7/13: L5-S1 Diskectomy     Comment:  Dr Arnoldo Morale No date: LUMBAR FUSION     Comment:  l4-l5 No date: SPINAL CORD DECOMPRESSION  BMI    Body Mass Index:  29.29 kg/m      Reproductive/Obstetrics negative OB ROS                             Anesthesia Physical  Anesthesia Plan  ASA: III  Anesthesia Plan: General   Post-op Pain Management:    Induction: Intravenous  PONV Risk Score and Plan: Propofol infusion and TIVA  Airway Management Planned: Natural Airway and Nasal Cannula  Additional Equipment:    Intra-op Plan:   Post-operative Plan: Extubation in OR  Informed Consent: I have reviewed the patients History and Physical, chart, labs and discussed the procedure including the risks, benefits and alternatives for the proposed anesthesia with the patient or authorized representative who has indicated his/her understanding and acceptance.     Dental Advisory Given  Plan Discussed with: Anesthesiologist, CRNA and Surgeon  Anesthesia Plan Comments: (Patient consented for risks of anesthesia including but not limited to:  - adverse reactions to medications - damage to teeth, lips or other oral mucosa - sore throat or hoarseness - Damage to heart, brain, lungs or loss of life  Patient voiced understanding.)        Anesthesia Quick Evaluation

## 2018-10-16 NOTE — Anesthesia Postprocedure Evaluation (Signed)
Anesthesia Post Note  Patient: Jacob Perkins.  Procedure(s) Performed: COLONOSCOPY WITH PROPOFOL (N/A )  Patient location during evaluation: Endoscopy Anesthesia Type: General Level of consciousness: awake and alert Pain management: pain level controlled Vital Signs Assessment: post-procedure vital signs reviewed and stable Respiratory status: spontaneous breathing, nonlabored ventilation, respiratory function stable and patient connected to nasal cannula oxygen Cardiovascular status: blood pressure returned to baseline and stable Postop Assessment: no apparent nausea or vomiting Anesthetic complications: no     Last Vitals:  Vitals:   10/16/18 0910 10/16/18 0920  BP: (!) 144/64 (!) 159/60  Pulse: (!) 45 (!) 48  Resp: 18 18  Temp:    SpO2: 98% 98%    Last Pain:  Vitals:   10/16/18 0830  TempSrc: Tympanic  PainSc:                  Martha Clan

## 2018-10-16 NOTE — Anesthesia Post-op Follow-up Note (Signed)
Anesthesia QCDR form completed.        

## 2018-10-17 ENCOUNTER — Encounter: Payer: Self-pay | Admitting: Internal Medicine

## 2018-10-17 ENCOUNTER — Telehealth: Payer: Self-pay | Admitting: Urology

## 2018-10-17 NOTE — Telephone Encounter (Signed)
Would you call Mr. Schoenbeck and schedule him a follow up with me?  He needs to have a testosterone before 10 am, LFT's, HCT and Hbg prior to his appointment.  His has an appointment with Dr. Diamantina Providence in October, but this is for follow up on his HoLEP.

## 2018-10-18 NOTE — Telephone Encounter (Signed)
App made and mailed to patient ° °Jacob Perkins °

## 2018-10-25 DIAGNOSIS — R69 Illness, unspecified: Secondary | ICD-10-CM | POA: Diagnosis not present

## 2018-11-05 ENCOUNTER — Other Ambulatory Visit: Payer: Self-pay | Admitting: Family Medicine

## 2018-11-05 DIAGNOSIS — E349 Endocrine disorder, unspecified: Secondary | ICD-10-CM

## 2018-11-06 ENCOUNTER — Other Ambulatory Visit: Payer: Medicare HMO

## 2018-11-06 ENCOUNTER — Other Ambulatory Visit: Payer: Self-pay

## 2018-11-06 DIAGNOSIS — E349 Endocrine disorder, unspecified: Secondary | ICD-10-CM | POA: Diagnosis not present

## 2018-11-07 LAB — HEMOGLOBIN: Hemoglobin: 15 g/dL (ref 13.0–17.7)

## 2018-11-07 LAB — HEPATIC FUNCTION PANEL
ALT: 26 IU/L (ref 0–44)
AST: 26 IU/L (ref 0–40)
Albumin: 4.6 g/dL (ref 3.8–4.8)
Alkaline Phosphatase: 28 IU/L — ABNORMAL LOW (ref 39–117)
Bilirubin Total: 0.4 mg/dL (ref 0.0–1.2)
Bilirubin, Direct: 0.11 mg/dL (ref 0.00–0.40)
Total Protein: 6.4 g/dL (ref 6.0–8.5)

## 2018-11-07 LAB — HEMATOCRIT: Hematocrit: 44.3 % (ref 37.5–51.0)

## 2018-11-07 LAB — TESTOSTERONE: Testosterone: 646 ng/dL (ref 264–916)

## 2018-11-08 DIAGNOSIS — E119 Type 2 diabetes mellitus without complications: Secondary | ICD-10-CM | POA: Diagnosis not present

## 2018-11-13 ENCOUNTER — Ambulatory Visit: Payer: Medicare HMO | Admitting: Urology

## 2018-11-15 NOTE — Progress Notes (Signed)
11/16/2018 10:50 AM   Jacob Perkins. Mar 21, 1951 [id number]  Referring provider: Baxter Hire, MD [place],  [place] [address]  Chief Complaint  Patient presents with   Benign Prostatic Hypertrophy    HPI: Patient is a 68 year old male with testosterone deficiency, erectile dysfunction and BPH with lower urinary tract symptoms who presents today for 6 month follow-up.  Testosterone deficiency He is no longer having spontaneous erections at night.  He does not have sleep apnea.   He is currently managing his testosterone deficiency with Clomid 50 mg, 1 tablets daily.  His current serum testosterone level is 646 ng/dL on 11/06/2018.  His HCT and LFT's are normal.    Erectile dysfunction His SHIM score is 20, which is mild ED.   His previous SHIM score was 18.  He has been having difficulty with erections for the last few years.   His major complaint is still his confidence to keep an erection.  His libido is diminished.   His risk factors for ED are age, BPH, testosterone deficiency, spinal stenosis, DM, HTN, HLD and anxiety.   He denies any painful erections or curvatures with his erections.     SHIM    Row Name 11/16/18 1037         SHIM: Over the last 6 months:   How do you rate your confidence that you could get and keep an erection?  High     When you had erections with sexual stimulation, how often were your erections hard enough for penetration (entering your partner)?  Most Times (much more than half the time)     During sexual intercourse, how often were you able to maintain your erection after you had penetrated (entered) your partner?  Most Times (much more than half the time)     During sexual intercourse, how difficult was it to maintain your erection to completion of intercourse?  Slightly Difficult     When you attempted sexual intercourse, how often was it satisfactory for you?  Most Times (much more than half the time)       SHIM  Total Score   SHIM  20        Score: 1-7 Severe ED 8-11 Moderate ED 12-16 Mild-Moderate ED 17-21 Mild ED 22-25 No ED   BPH WITH LUTS Patient underwent a HoLEP with Dr. Diamantina Providence on 05/25/2018.    PMH: Past Medical History:  Diagnosis Date   Adenomatous polyp    Allergy    Anxiety    BPH (benign prostatic hypertrophy)    Diabetes mellitus    Diverticulosis    GERD (gastroesophageal reflux disease)    Hyperlipidemia    Hypertension    Rosacea    Spinal stenosis    disc disease    Surgical History: Past Surgical History:  Procedure Laterality Date   ASD REPAIR     BACK SURGERY     COLONOSCOPY WITH PROPOFOL N/A 10/16/2018   Procedure: COLONOSCOPY WITH PROPOFOL;  Surgeon: Toledo, Benay Pike, MD;  Location: [facility] ENDOSCOPY;  Service: Gastroenterology;  Laterality: N/A;   HOLEP-LASER ENUCLEATION OF THE PROSTATE WITH MORCELLATION N/A 05/25/2018   Procedure: HOLEP-LASER ENUCLEATION OF THE PROSTATE WITH MORCELLATION;  Surgeon: Billey Co, MD;  Location: [facility] ORS;  Service: Urology;  Laterality: N/A;   L5-S1 Diskectomy  7/13   Dr Arnoldo Morale   LUMBAR FUSION     l4-l5   SPINAL CORD DECOMPRESSION      Home Medications:  Allergies as of 11/16/2018   No Known Allergies     Medication List       Accurate as of November 16, 2018 10:50 AM. If you have any questions, ask your nurse or doctor.        Aleve PM 220-25 MG Tabs Generic drug: Naproxen Sod-diphenhydrAMINE Take 1 tablet by mouth at bedtime.   aspirin 81 MG tablet Take 81 mg by mouth at bedtime.   B-D ULTRAFINE III SHORT PEN 31G X 8 MM Misc Generic drug: Insulin Pen Needle USE AS DIRECTED   clomiPHENE 50 MG tablet Commonly known as: CLOMID Take 1 tablet (50 mg total) by mouth daily.   esomeprazole 40 MG capsule Commonly known as: NEXIUM Take 40 mg by mouth every other day.   finasteride 5 MG tablet Commonly known as: PROSCAR Take 5 mg by mouth daily.   fish oil-omega-3 fatty acids  1000 MG capsule Take 1 g by mouth 2 (two) times daily.   fluticasone 50 MCG/ACT nasal spray Commonly known as: FLONASE USE 2 SPRAYS IN EACH NOSTRIL            EVERY DAY What changed:   when to take this  reasons to take this   gabapentin 300 MG capsule Commonly known as: NEURONTIN Take 300 mg by mouth 2 (two) times daily.   GLUCOSAMINE-CHONDROITIN PO Take 1 tablet by mouth 2 (two) times daily.   ibuprofen 200 MG tablet Commonly known as: ADVIL Take 400-600 mg by mouth every 6 (six) hours as needed for headache or moderate pain.   ketotifen 0.025 % ophthalmic solution Commonly known as: ZADITOR Place 1 drop into both eyes daily.   Levemir FlexTouch 100 UNIT/ML Pen Generic drug: Insulin Detemir Inject 50 Units into the skin at bedtime.   Melatonin 10 MG Tabs Take 10 mg by mouth at bedtime.   metFORMIN 1000 MG tablet Commonly known as: GLUCOPHAGE TAKE ONE TABLET TWICE A DAY What changed: when to take this   Multi-betic Diabetes Tabs Take 2 tablets by mouth daily.   ONE TOUCH ULTRA TEST test strip Generic drug: glucose blood USE AS DIRECTED   ramipril 5 MG capsule Commonly known as: ALTACE TAKE ONE CAPSULE DAILY   simvastatin 40 MG tablet Commonly known as: ZOCOR TAKE 1 TABLET EVERY DAY What changed: when to take this   Trospium Chloride 60 MG 123456   Trulicity 1.5 0000000 Sopn Generic drug: Dulaglutide Inject 1.5 mg into the skin every 7 (seven) days. Thursday am   vitamin C 1000 MG tablet Take 1,000 mg by mouth daily.       Allergies: No Known Allergies  Family History: Family History  Problem Relation Age of Onset   Diabetes Father    Urolithiasis Father    Alcohol abuse Paternal Aunt    Kidney disease Cousin    Kidney cancer Neg Hx    Prostate cancer Neg Hx    Bladder Cancer Neg Hx     Social History:  reports that he quit smoking about 11 years ago. His smoking use included cigars. He has never used smokeless tobacco. He reports  current alcohol use. He reports that he does not use drugs.  ROS: UROLOGY Frequent Urination?: No Hard to postpone urination?: No Burning/pain with urination?: No Get up at night to urinate?: No Leakage of urine?: No Urine stream starts and stops?: No Trouble starting stream?: No Do you have to strain to urinate?: No Blood in urine?: No Urinary tract infection?: No Sexually  transmitted disease?: No Injury to kidneys or bladder?: No Painful intercourse?: No Weak stream?: No Erection problems?: No Penile pain?: No  Gastrointestinal Nausea?: No Vomiting?: No Indigestion/heartburn?: No Diarrhea?: No Constipation?: No  Constitutional Fever: No Night sweats?: No Weight loss?: No Fatigue?: No  Skin Skin rash/lesions?: No Itching?: No  Eyes Blurred vision?: No Double vision?: No  Ears/Nose/Throat Sore throat?: No Sinus problems?: No  Hematologic/Lymphatic Swollen glands?: No Easy bruising?: No  Cardiovascular Leg swelling?: No Chest pain?: No  Respiratory Cough?: No Shortness of breath?: No  Endocrine Excessive thirst?: No  Musculoskeletal Back pain?: No Joint pain?: No  Neurological Headaches?: No Dizziness?: No  Psychologic Depression?: No Anxiety?: No  Physical Exam: BP (!) 153/105 (BP Location: Left Arm, Patient Position: Sitting, Cuff Size: Normal)    Pulse (!) 48    Ht 5\' 5"  (1.651 m)    Wt 175 lb 4.8 oz (79.5 kg)    BMI 29.17 kg/m   Constitutional:  Well nourished. Alert and oriented, No acute distress. HEENT: [place] AT, moist mucus membranes.  Trachea midline, no masses. Cardiovascular: No clubbing, cyanosis, or edema. Respiratory: Normal respiratory effort, no increased work of breathing. GI: Abdomen is soft, non tender, non distended, no abdominal masses. Liver and spleen not palpable.  No hernias appreciated.  Stool sample for occult testing is not indicated.   GU: No CVA tenderness.  No bladder fullness or masses.  Patient with  circumcised phallus.  Urethral meatus is patent.  No penile discharge. No penile lesions or rashes. Scrotum without lesions, cysts, rashes and/or edema.  Testicles are located scrotally bilaterally. No masses are appreciated in the testicles. Left and right epididymis are normal. Rectal: Not performed Skin: No rashes, bruises or suspicious lesions. Lymph: No inguinal adenopathy. Neurologic: Grossly intact, no focal deficits, moving all 4 extremities. Psychiatric: Normal mood and affect.   Laboratory Data: PSA  0.9 ng/mL on 01/02/2017 PSA 1.0 ng/mL on 07/05/2016 PSA 0.9 ng/mL on 07/04/2017 PSA 0.9 ng/mL on 03/02/2018 Lab Results  Component Value Date   PSA 1.48 04/18/2011   PSA 1.24 03/08/2010   PSA 1.18 03/18/2009    Lab Results  Component Value Date   TESTOSTERONE 646 11/06/2018    Lab Results  Component Value Date   AST 26 11/06/2018   Lab Results  Component Value Date   ALT 26 11/06/2018   TSH  1.530  02/2016  I have reviewed the labs.  Pertinent Imaging Component     Latest Ref Rng & Units 09/28/2015 01/07/2016 07/07/2016 01/05/2017  Scan Result      228 147 133 138   Component     Latest Ref Rng & Units 09/06/2017 03/07/2018  Scan Result      160 217 ml    Assessment & Plan:    1. Testosterone deficiency -continue Clomid 50 mg, 1 tablet daily -RTC in 6 months for HCT, testosterone, PSA, LFT's and exam  2. BPH with LUTS S/P HoLEP 04/2018 - very pleased with procedure  Has follow up with Dr. Diamantina Providence on 01/02/2019  3. Erectile dysfunction:   -SHIM score 20, it is stable  -RTC in 6 months for SHIM score and exam, as testosterone therapy can affect erections  Return for keep follow up appointment with Dr. Diamantina Providence on 01/02/2019 .  These notes generated with voice recognition software. I apologize for typographical errors.  Zara Council, PA-C  [facility] [address] [place], [place] [address] [phone]

## 2018-11-16 ENCOUNTER — Ambulatory Visit (INDEPENDENT_AMBULATORY_CARE_PROVIDER_SITE_OTHER): Payer: Medicare HMO | Admitting: Urology

## 2018-11-16 ENCOUNTER — Other Ambulatory Visit: Payer: Self-pay

## 2018-11-16 ENCOUNTER — Encounter: Payer: Self-pay | Admitting: Urology

## 2018-11-16 VITALS — BP 153/105 | HR 48 | Ht 65.0 in | Wt 175.3 lb

## 2018-11-16 DIAGNOSIS — N401 Enlarged prostate with lower urinary tract symptoms: Secondary | ICD-10-CM

## 2018-11-16 DIAGNOSIS — N138 Other obstructive and reflux uropathy: Secondary | ICD-10-CM | POA: Diagnosis not present

## 2018-11-16 DIAGNOSIS — E291 Testicular hypofunction: Secondary | ICD-10-CM

## 2018-11-16 DIAGNOSIS — N529 Male erectile dysfunction, unspecified: Secondary | ICD-10-CM

## 2018-11-16 DIAGNOSIS — E349 Endocrine disorder, unspecified: Secondary | ICD-10-CM | POA: Diagnosis not present

## 2018-11-16 MED ORDER — CLOMIPHENE CITRATE 50 MG PO TABS: 50.0000 mg | ORAL_TABLET | Freq: Every day | ORAL | 3 refills | Status: DC

## 2018-12-13 DIAGNOSIS — R69 Illness, unspecified: Secondary | ICD-10-CM | POA: Diagnosis not present

## 2018-12-18 DIAGNOSIS — R69 Illness, unspecified: Secondary | ICD-10-CM | POA: Diagnosis not present

## 2018-12-28 DIAGNOSIS — R69 Illness, unspecified: Secondary | ICD-10-CM | POA: Diagnosis not present

## 2019-01-02 ENCOUNTER — Ambulatory Visit: Payer: Medicare HMO | Admitting: Urology

## 2019-01-09 ENCOUNTER — Ambulatory Visit: Payer: Medicare HMO | Admitting: Urology

## 2019-01-09 ENCOUNTER — Encounter: Payer: Self-pay | Admitting: Urology

## 2019-01-09 ENCOUNTER — Other Ambulatory Visit: Payer: Self-pay

## 2019-01-09 VITALS — BP 131/74 | HR 52 | Ht 65.0 in | Wt 180.0 lb

## 2019-01-09 DIAGNOSIS — N138 Other obstructive and reflux uropathy: Secondary | ICD-10-CM | POA: Diagnosis not present

## 2019-01-09 DIAGNOSIS — N401 Enlarged prostate with lower urinary tract symptoms: Secondary | ICD-10-CM

## 2019-01-09 LAB — BLADDER SCAN AMB NON-IMAGING

## 2019-01-09 NOTE — Addendum Note (Signed)
Addended by: Verlene Mayer A on: 01/09/2019 03:53 PM   Modules accepted: Orders

## 2019-01-09 NOTE — Progress Notes (Signed)
   01/09/2019 3:06 PM   Jacob Pat Jr. 08/25/1950 [id number]  Reason for visit: Follow up status post HOLEP  HPI: I saw Jacob Perkins back in urology clinic for follow-up after HOLEP on 05/25/2018.  He underwent hold for an 82 g prostate with PVRs greater than 300 mL, recurrent urinary tract infections, and severe urinary tract symptoms of weak stream, urgency, frequency, and difficulty voiding.  Pathology showed 37 g of benign tissue.  He is currently delighted with his urinary symptoms, and IPSS score today is 6, and PVR 50 mL.  He is not taking any prostate medications at this time.  He continues to follow with our PA Zara Council regarding testosterone and erectile dysfunction.   ROS: Please see flowsheet from today's date for complete review of systems.  Physical Exam: BP 131/74   Pulse (!) 52   Ht 5\' 5"  (1.651 m)   Wt 180 lb (81.6 kg)   BMI 29.95 kg/m    Constitutional:  Alert and oriented, No acute distress. Respiratory: Normal respiratory effort, no increased work of breathing. GI: Abdomen is soft, nontender, nondistended, no abdominal masses Skin: No rashes, bruises or suspicious lesions. Neurologic: Grossly intact, no focal deficits, moving all 4 extremities. Psychiatric: Normal mood and affect  Assessment & Plan:   In summary, he is a 68 year old male 8 months out from [facility] for BPH with obstructive urinary symptoms, recurrent UTIs, and PVRs greater than 300 mL.  He is doing extremely well and PVR is normal at 56mL today.  Keep follow-up with [facility] regarding ED, PSA screening, and testosterone  A total of 15 minutes were spent face-to-face with the patient, greater than 50% was spent in patient education, counseling, and coordination of care regarding postop expectations after HOLEP.  Billey Co, [facility] [address] [place], [place] [address] [phone]

## 2019-01-10 LAB — MICROSCOPIC EXAMINATION: RBC, Urine: NONE SEEN /hpf (ref 0–2)

## 2019-01-10 LAB — URINALYSIS, COMPLETE
Bilirubin, UA: NEGATIVE
Glucose, UA: NEGATIVE
Nitrite, UA: NEGATIVE
RBC, UA: NEGATIVE
Specific Gravity, UA: >1.03 — ABNORMAL HIGH (ref 1.005–1.030)
Urobilinogen, Ur: 0.2 mg/dL (ref 0.2–1.0)
pH, UA: 5.5 (ref 5.0–7.5)

## 2019-01-18 DIAGNOSIS — E1129 Type 2 diabetes mellitus with other diabetic kidney complication: Secondary | ICD-10-CM | POA: Diagnosis not present

## 2019-01-18 DIAGNOSIS — R809 Proteinuria, unspecified: Secondary | ICD-10-CM | POA: Diagnosis not present

## 2019-01-18 DIAGNOSIS — Z794 Long term (current) use of insulin: Secondary | ICD-10-CM | POA: Diagnosis not present

## 2019-01-18 DIAGNOSIS — E119 Type 2 diabetes mellitus without complications: Secondary | ICD-10-CM | POA: Diagnosis not present

## 2019-01-25 DIAGNOSIS — E1129 Type 2 diabetes mellitus with other diabetic kidney complication: Secondary | ICD-10-CM | POA: Diagnosis not present

## 2019-01-25 DIAGNOSIS — R809 Proteinuria, unspecified: Secondary | ICD-10-CM | POA: Diagnosis not present

## 2019-02-08 DIAGNOSIS — R69 Illness, unspecified: Secondary | ICD-10-CM | POA: Diagnosis not present

## 2019-02-14 DIAGNOSIS — E538 Deficiency of other specified B group vitamins: Secondary | ICD-10-CM | POA: Diagnosis not present

## 2019-02-14 DIAGNOSIS — Z79899 Other long term (current) drug therapy: Secondary | ICD-10-CM | POA: Diagnosis not present

## 2019-02-14 DIAGNOSIS — N400 Enlarged prostate without lower urinary tract symptoms: Secondary | ICD-10-CM | POA: Diagnosis not present

## 2019-02-14 DIAGNOSIS — E7800 Pure hypercholesterolemia, unspecified: Secondary | ICD-10-CM | POA: Diagnosis not present

## 2019-02-14 DIAGNOSIS — Z87891 Personal history of nicotine dependence: Secondary | ICD-10-CM | POA: Diagnosis not present

## 2019-02-14 DIAGNOSIS — I10 Essential (primary) hypertension: Secondary | ICD-10-CM | POA: Diagnosis not present

## 2019-02-14 DIAGNOSIS — Z7984 Long term (current) use of oral hypoglycemic drugs: Secondary | ICD-10-CM | POA: Diagnosis not present

## 2019-02-14 DIAGNOSIS — E119 Type 2 diabetes mellitus without complications: Secondary | ICD-10-CM | POA: Diagnosis not present

## 2019-03-14 DIAGNOSIS — E119 Type 2 diabetes mellitus without complications: Secondary | ICD-10-CM | POA: Diagnosis not present

## 2019-03-14 DIAGNOSIS — K219 Gastro-esophageal reflux disease without esophagitis: Secondary | ICD-10-CM | POA: Diagnosis not present

## 2019-03-14 DIAGNOSIS — E785 Hyperlipidemia, unspecified: Secondary | ICD-10-CM | POA: Diagnosis not present

## 2019-03-14 DIAGNOSIS — I10 Essential (primary) hypertension: Secondary | ICD-10-CM | POA: Diagnosis not present

## 2019-03-14 DIAGNOSIS — G8929 Other chronic pain: Secondary | ICD-10-CM | POA: Diagnosis not present

## 2019-03-14 DIAGNOSIS — M1990 Unspecified osteoarthritis, unspecified site: Secondary | ICD-10-CM | POA: Diagnosis not present

## 2019-03-14 DIAGNOSIS — K5900 Constipation, unspecified: Secondary | ICD-10-CM | POA: Diagnosis not present

## 2019-03-14 DIAGNOSIS — E291 Testicular hypofunction: Secondary | ICD-10-CM | POA: Diagnosis not present

## 2019-03-14 DIAGNOSIS — Z794 Long term (current) use of insulin: Secondary | ICD-10-CM | POA: Diagnosis not present

## 2019-03-14 DIAGNOSIS — Z7982 Long term (current) use of aspirin: Secondary | ICD-10-CM | POA: Diagnosis not present

## 2019-04-10 DIAGNOSIS — R69 Illness, unspecified: Secondary | ICD-10-CM | POA: Diagnosis not present

## 2019-04-26 ENCOUNTER — Telehealth: Payer: Self-pay | Admitting: Family Medicine

## 2019-04-26 NOTE — Telephone Encounter (Signed)
Patient returned call and he is still taking the medication. PA was sent to plan for Clomid.

## 2019-04-26 NOTE — Telephone Encounter (Signed)
LMOM for patient to return call. I have received a PA for a medication not prescribed since 11/16/18. Need to know if this is a mistake.

## 2019-04-30 ENCOUNTER — Telehealth: Payer: Self-pay | Admitting: Family Medicine

## 2019-04-30 NOTE — Telephone Encounter (Signed)
Clomid has been approved 03/29/2019-03/27/2020

## 2019-05-08 ENCOUNTER — Ambulatory Visit: Payer: Medicare HMO

## 2019-05-09 ENCOUNTER — Ambulatory Visit: Payer: Medicare HMO

## 2019-05-13 ENCOUNTER — Other Ambulatory Visit: Payer: Self-pay | Admitting: *Deleted

## 2019-05-13 DIAGNOSIS — N401 Enlarged prostate with lower urinary tract symptoms: Secondary | ICD-10-CM

## 2019-05-13 DIAGNOSIS — E349 Endocrine disorder, unspecified: Secondary | ICD-10-CM

## 2019-05-14 ENCOUNTER — Other Ambulatory Visit: Payer: Self-pay

## 2019-05-14 ENCOUNTER — Other Ambulatory Visit: Payer: Medicare HMO

## 2019-05-14 DIAGNOSIS — N401 Enlarged prostate with lower urinary tract symptoms: Secondary | ICD-10-CM

## 2019-05-14 DIAGNOSIS — E349 Endocrine disorder, unspecified: Secondary | ICD-10-CM

## 2019-05-15 LAB — HEMOGLOBIN AND HEMATOCRIT, BLOOD
Hematocrit: 44.6 % (ref 37.5–51.0)
Hemoglobin: 15.2 g/dL (ref 13.0–17.7)

## 2019-05-15 LAB — HEPATIC FUNCTION PANEL
ALT: 30 IU/L (ref 0–44)
AST: 28 IU/L (ref 0–40)
Albumin: 4.7 g/dL (ref 3.8–4.8)
Alkaline Phosphatase: 29 IU/L — ABNORMAL LOW (ref 39–117)
Bilirubin Total: 0.7 mg/dL (ref 0.0–1.2)
Bilirubin, Direct: 0.18 mg/dL (ref 0.00–0.40)
Total Protein: 6.7 g/dL (ref 6.0–8.5)

## 2019-05-15 LAB — PSA: Prostate Specific Ag, Serum: 1 ng/mL (ref 0.0–4.0)

## 2019-05-15 LAB — TESTOSTERONE: Testosterone: 404 ng/dL (ref 264–916)

## 2019-05-21 ENCOUNTER — Other Ambulatory Visit: Payer: Self-pay

## 2019-05-21 ENCOUNTER — Ambulatory Visit: Payer: Medicare HMO | Admitting: Urology

## 2019-05-21 ENCOUNTER — Encounter: Payer: Self-pay | Admitting: Urology

## 2019-05-21 VITALS — BP 149/71 | HR 55 | Ht 65.0 in | Wt 172.4 lb

## 2019-05-21 DIAGNOSIS — E349 Endocrine disorder, unspecified: Secondary | ICD-10-CM

## 2019-05-21 DIAGNOSIS — N529 Male erectile dysfunction, unspecified: Secondary | ICD-10-CM

## 2019-05-21 DIAGNOSIS — N401 Enlarged prostate with lower urinary tract symptoms: Secondary | ICD-10-CM | POA: Diagnosis not present

## 2019-05-21 LAB — BLADDER SCAN AMB NON-IMAGING: Scan Result: 83

## 2019-05-21 MED ORDER — CLOMIPHENE CITRATE 50 MG PO TABS: 50.0000 mg | ORAL_TABLET | Freq: Every day | ORAL | 3 refills | Status: DC

## 2019-05-21 NOTE — Progress Notes (Signed)
05/21/2019 3:49 PM   Jacob Pat Jr. 1950-10-02 [id number]  Referring provider: Baxter Hire, MD [place],  [place] [address]  Chief Complaint  Patient presents with  . Benign Prostatic Hypertrophy    HPI: Patient is a 69 year old male with testosterone deficiency, erectile dysfunction and BPH with lower urinary tract symptoms who presents today for 6 month follow-up.  Testosterone deficiency He is no longer having spontaneous erections at night.  He does not have sleep apnea.   He is currently managing his testosterone deficiency with Clomid 50 mg, 1 tablets daily.  His current serum testosterone level is 404 ng/dL on 05/14/2019.  His HCT and LFT's are normal.    Erectile dysfunction His SHIM score is 17, which is mild ED.   His previous SHIM score was 20.  He has been having difficulty with erections for the last few years.   His major complaint is still his confidence to keep an erection.  His libido is diminished.   His risk factors for ED are age, BPH, testosterone deficiency, spinal stenosis, DM, HTN, HLD and anxiety.   He denies any painful erections or curvatures with his erections.     SHIM    Row Name 05/21/19 1337         SHIM: Over the last 6 months:   How do you rate your confidence that you could get and keep an erection?  Moderate     When you had erections with sexual stimulation, how often were your erections hard enough for penetration (entering your partner)?  A Few Times (much less than half the time)     During sexual intercourse, how often were you able to maintain your erection after you had penetrated (entered) your partner?  Most Times (much more than half the time)     During sexual intercourse, how difficult was it to maintain your erection to completion of intercourse?  Slightly Difficult     When you attempted sexual intercourse, how often was it satisfactory for you?  Most Times (much more than half the time)       SHIM  Total Score   SHIM  17        Score: 1-7 Severe ED 8-11 Moderate ED 12-16 Mild-Moderate ED 17-21 Mild ED 22-25 No ED   BPH WITH LUTS  (prostate and/or bladder) IPSS score: 3/0   PVR: 83 mL   Previous score: 12/3  Previous PVR: 217 mL  Denies any dysuria, hematuria or suprapubic pain.   His has had HoLEP on 05/25/2018 with Dr. Diamantina Providence.  Pathology benign.     Denies any recent fevers, chills, nausea or vomiting.  He does not have a family history of PCa.  IPSS    Row Name 05/21/19 1300         International Prostate Symptom Score   How often have you had the sensation of not emptying your bladder?  Less than 1 in 5     How often have you had to urinate less than every two hours?  Less than 1 in 5 times     How often have you found you stopped and started again several times when you urinated?  Not at All     How often have you found it difficult to postpone urination?  Not at All     How often have you had a weak urinary stream?  Not at All     How often have you  had to strain to start urination?  Not at All     How many times did you typically get up at night to urinate?  1 Time     Total IPSS Score  3       Quality of Life due to urinary symptoms   If you were to spend the rest of your life with your urinary condition just the way it is now how would you feel about that?  Delighted        Score:  1-7 Mild 8-19 Moderate 20-35 Severe  PMH: Past Medical History:  Diagnosis Date  . Adenomatous polyp   . Allergy   . Anxiety   . BPH (benign prostatic hypertrophy)   . Diabetes mellitus   . Diverticulosis   . GERD (gastroesophageal reflux disease)   . Hyperlipidemia   . Hypertension   . Rosacea   . Spinal stenosis    disc disease    Surgical History: Past Surgical History:  Procedure Laterality Date  . ASD REPAIR    . BACK SURGERY    . COLONOSCOPY WITH PROPOFOL N/A 10/16/2018   Procedure: COLONOSCOPY WITH PROPOFOL;  Surgeon: Toledo, Benay Pike, MD;   Location: [facility] ENDOSCOPY;  Service: Gastroenterology;  Laterality: N/A;  . HOLEP-LASER ENUCLEATION OF THE PROSTATE WITH MORCELLATION N/A 05/25/2018   Procedure: HOLEP-LASER ENUCLEATION OF THE PROSTATE WITH MORCELLATION;  Surgeon: Billey Co, MD;  Location: [facility] ORS;  Service: Urology;  Laterality: N/A;  . L5-S1 Diskectomy  7/13   Dr Arnoldo Morale  . LUMBAR FUSION     l4-l5  . SPINAL CORD DECOMPRESSION      Home Medications:  Allergies as of 05/21/2019   No Known Allergies     Medication List       Accurate as of May 21, 2019 11:59 PM. If you have any questions, ask your nurse or doctor.        STOP taking these medications   finasteride 5 MG tablet Commonly known as: PROSCAR Stopped by: Toby Breithaupt, PA-C     TAKE these medications   Aleve PM 220-25 MG Tabs Generic drug: Naproxen Sod-diphenhydrAMINE Take 1 tablet by mouth at bedtime.   aspirin 81 MG tablet Take 81 mg by mouth at bedtime.   B-D ULTRAFINE III SHORT PEN 31G X 8 MM Misc Generic drug: Insulin Pen Needle USE AS DIRECTED   clomiPHENE 50 MG tablet Commonly known as: CLOMID Take 1 tablet (50 mg total) by mouth daily.   esomeprazole 40 MG capsule Commonly known as: NEXIUM Take 40 mg by mouth every other day.   fish oil-omega-3 fatty acids 1000 MG capsule Take 1 g by mouth 2 (two) times daily.   fluticasone 50 MCG/ACT nasal spray Commonly known as: FLONASE USE 2 SPRAYS IN EACH NOSTRIL            EVERY DAY What changed:   when to take this  reasons to take this   gabapentin 300 MG capsule Commonly known as: NEURONTIN Take 300 mg by mouth 2 (two) times daily.   GLUCOSAMINE-CHONDROITIN PO Take 1 tablet by mouth 2 (two) times daily.   ibuprofen 200 MG tablet Commonly known as: ADVIL Take 400-600 mg by mouth every 6 (six) hours as needed for headache or moderate pain.   ketotifen 0.025 % ophthalmic solution Commonly known as: ZADITOR Place 1 drop into both eyes daily.   Levemir  FlexTouch 100 UNIT/ML FlexPen Generic drug: insulin detemir Inject 50 Units into the skin at  bedtime.   Melatonin 10 MG Tabs Take 10 mg by mouth at bedtime.   metFORMIN 1000 MG tablet Commonly known as: GLUCOPHAGE TAKE ONE TABLET TWICE A DAY What changed: when to take this   Multi-betic Diabetes Tabs Take 2 tablets by mouth daily.   ONE TOUCH ULTRA TEST test strip Generic drug: glucose blood USE AS DIRECTED   ramipril 5 MG capsule Commonly known as: ALTACE TAKE ONE CAPSULE DAILY   simvastatin 40 MG tablet Commonly known as: ZOCOR TAKE 1 TABLET EVERY DAY What changed: when to take this   Trulicity 1.5 0000000 Sopn Generic drug: Dulaglutide Inject 1.5 mg into the skin every 7 (seven) days. Thursday am   vitamin C 1000 MG tablet Take 1,000 mg by mouth daily.       Allergies: No Known Allergies  Family History: Family History  Problem Relation Age of Onset  . Diabetes Father   . Urolithiasis Father   . Alcohol abuse Paternal Aunt   . Kidney disease Cousin   . Kidney cancer Neg Hx   . Prostate cancer Neg Hx   . Bladder Cancer Neg Hx     Social History:  reports that he quit smoking about 12 years ago. His smoking use included cigars. He has never used smokeless tobacco. He reports current alcohol use. He reports that he does not use drugs.  ROS: For pertinent review of systems please refer to history of present illness  Physical Exam: BP (!) 149/71   Pulse (!) 55   Ht 5\' 5"  (1.651 m)   Wt 172 lb 6.4 oz (78.2 kg)   BMI 28.69 kg/m   Constitutional:  Well nourished. Alert and oriented, No acute distress. HEENT: [place] AT, mask in place.  Trachea midline, no masses. Cardiovascular: No clubbing, cyanosis, or edema. Respiratory: Normal respiratory effort, no increased work of breathing. GI: Abdomen is soft, non tender, non distended, no abdominal masses.  GU: No CVA tenderness.  No bladder fullness or masses.  Patient with circumcised phallus.  Urethral meatus  is patent.  No penile discharge. No penile lesions or rashes. Scrotum without lesions, cysts, rashes and/or edema.  Testicles are located scrotally bilaterally. No masses are appreciated in the testicles. Left and right epididymis are normal. Rectal: Patient with  normal sphincter tone. Anus and perineum without scarring or rashes. No rectal masses are appreciated. Prostate is approximately 50 grams, no nodules are appreciated. Seminal vesicles could not palpate.   Skin: No rashes, bruises or suspicious lesions. Lymph: No inguinal adenopathy. Neurologic: Grossly intact, no focal deficits, moving all 4 extremities. Psychiatric: Normal mood and affect.   Laboratory Data: PSA  0.9 ng/mL on 01/02/2017 PSA 1.0 ng/mL on 07/05/2016 PSA 0.9 ng/mL on 07/04/2017 PSA 0.9 ng/mL on 03/02/2018 Lab Results  Component Value Date   PSA 1.48 04/18/2011   PSA 1.24 03/08/2010   PSA 1.18 03/18/2009   Component     Latest Ref Rng & Units 09/28/2015 01/07/2016 07/05/2016 01/02/2017  Prostate Specific Ag, Serum     0.0 - 4.0 ng/mL 1.9 1.4 1.0 0.9   Component     Latest Ref Rng & Units 07/04/2017 03/02/2018 05/14/2019  Prostate Specific Ag, Serum     0.0 - 4.0 ng/mL 0.9 0.9 1.0    Lab Results  Component Value Date   TESTOSTERONE 404 05/14/2019    Lab Results  Component Value Date   AST 28 05/14/2019   Lab Results  Component Value Date   ALT 30 05/14/2019  TSH  1.530  02/2016  I have reviewed the labs.  Pertinent Imaging Results for Jacob Perkins, Jacob D JR. "DEL" (MRN [id number]) as of 06/01/2019 15:46  Ref. Range 05/21/2019 13:42  Scan Result Unknown [id number]    Assessment & Plan:    1. Testosterone deficiency -continue Clomid 50 mg, 1 tablet daily -refills given -RTC in 6 months for HCT, testosterone, PSA, LFT's and exam  2. BPH with LUTS S/P HoLEP 04/2018 - very pleased with procedure  I PSS 3/0, it is improved  3. Erectile dysfunction:   -SHIM score 17, it is worsening - but mostly satisfied  at this time -RTC in 6 months for SHIM score and exam, as testosterone therapy can affect erections  Return in about 6 months (around 11/18/2019) for IPSS, SHIM and exam, PSA, testosterone, H & H.  These notes generated with voice recognition software. I apologize for typographical errors.  Zara Council, PA-C  [facility] [address] [place], [place] [address] [phone]

## 2019-06-24 ENCOUNTER — Other Ambulatory Visit: Payer: Self-pay | Admitting: Urology

## 2019-06-24 DIAGNOSIS — E349 Endocrine disorder, unspecified: Secondary | ICD-10-CM

## 2019-08-13 ENCOUNTER — Other Ambulatory Visit: Payer: Self-pay

## 2019-08-13 ENCOUNTER — Ambulatory Visit: Payer: Medicare HMO | Admitting: Dermatology

## 2019-08-13 DIAGNOSIS — L720 Epidermal cyst: Secondary | ICD-10-CM | POA: Diagnosis not present

## 2019-08-13 DIAGNOSIS — L821 Other seborrheic keratosis: Secondary | ICD-10-CM

## 2019-08-13 DIAGNOSIS — L570 Actinic keratosis: Secondary | ICD-10-CM

## 2019-08-13 DIAGNOSIS — L219 Seborrheic dermatitis, unspecified: Secondary | ICD-10-CM

## 2019-08-13 DIAGNOSIS — D229 Melanocytic nevi, unspecified: Secondary | ICD-10-CM

## 2019-08-13 DIAGNOSIS — Z1283 Encounter for screening for malignant neoplasm of skin: Secondary | ICD-10-CM | POA: Diagnosis not present

## 2019-08-13 DIAGNOSIS — L578 Other skin changes due to chronic exposure to nonionizing radiation: Secondary | ICD-10-CM

## 2019-08-13 DIAGNOSIS — Z872 Personal history of diseases of the skin and subcutaneous tissue: Secondary | ICD-10-CM

## 2019-08-13 DIAGNOSIS — I781 Nevus, non-neoplastic: Secondary | ICD-10-CM

## 2019-08-13 DIAGNOSIS — L814 Other melanin hyperpigmentation: Secondary | ICD-10-CM

## 2019-08-13 DIAGNOSIS — D1801 Hemangioma of skin and subcutaneous tissue: Secondary | ICD-10-CM

## 2019-08-13 NOTE — Progress Notes (Signed)
   Follow-Up Visit   Subjective  Jacob Perkins. is a 69 y.o. male who presents for the following: Annual Exam (UBSE. Hx AKs.).  Has h/o Seb Derm. No new or changing spots of concern.  Had bump on arm that recently drained.    The following portions of the chart were reviewed this encounter and updated as appropriate:      Review of Systems:  No other skin or systemic complaints except as noted in HPI or Assessment and Plan.  Objective  Well appearing patient in no apparent distress; mood and affect are within normal limits.  All skin waist up examined.  Objective  Left Ear Helix x 2, Right Ear Helix x 1 (3): Erythematous thin papules/macules with gritty scale.   Objective  Right Upper Arm - Posterior, back: Dilated pore with mild erythema.  Objective  Forehead: Pink scaliness on eyebrows, glabella.  Objective  Right Lower Chest: Telangiectasia   Assessment & Plan   Skin cancer screening performed today.  Actinic Damage - diffuse scaly erythematous macules with underlying dyspigmentation - Recommend daily broad spectrum sunscreen SPF 30+ to sun-exposed areas, reapply every 2 hours as needed.  - Call for new or changing lesions.  Lentigines - Scattered tan macules - Discussed due to sun exposure - Benign, observe - Call for any changes  Melanocytic Nevi - Tan-brown and/or pink-flesh-colored symmetric macules and papules - Benign appearing on exam today - Observation - Call clinic for new or changing moles - Recommend daily use of broad spectrum spf 30+ sunscreen to sun-exposed areas.   Seborrheic Keratoses - Stuck-on, waxy, tan-brown papules and plaques  - Discussed benign etiology and prognosis. - Observe - Call for any changes  Hemangiomas - Red papules - Discussed benign nature - Observe - Call for any changes    AK (actinic keratosis) (3) Left Ear Helix x 2, Right Ear Helix x 1  Destruction of lesion - Left Ear Helix x 2, Right Ear Helix  x 1  Destruction method: cryotherapy   Informed consent: discussed and consent obtained   Lesion destroyed using liquid nitrogen: Yes   Region frozen until ice ball extended beyond lesion: Yes   Outcome: patient tolerated procedure well with no complications   Post-procedure details: wound care instructions given    Epidermal inclusion cyst Right Upper Arm - Posterior, back  Benign, observe.  Discussed excision if irritated   Seborrheic dermatitis Forehead  Cont 2.5% hydrocortisone cream qd/bid prn flares. Pt will call for refills.  Telangiectasias Right Lower Chest  Benign, observe.    Return in about 1 year (around 08/12/2020) for UBSE.  IJamesetta Orleans, CMA, am acting as scribe for Brendolyn Patty, MD .  Documentation: I have reviewed the above documentation for accuracy and completeness, and I agree with the above.  Brendolyn Patty MD

## 2019-08-13 NOTE — Patient Instructions (Addendum)
Cryotherapy Aftercare  . Wash gently with soap and water everyday.   . Apply Vaseline and Band-Aid daily until healed.  

## 2019-11-18 ENCOUNTER — Other Ambulatory Visit: Payer: Self-pay

## 2019-11-18 DIAGNOSIS — E349 Endocrine disorder, unspecified: Secondary | ICD-10-CM

## 2019-11-18 DIAGNOSIS — N401 Enlarged prostate with lower urinary tract symptoms: Secondary | ICD-10-CM

## 2019-11-18 DIAGNOSIS — E291 Testicular hypofunction: Secondary | ICD-10-CM

## 2019-11-19 ENCOUNTER — Other Ambulatory Visit: Payer: Medicare HMO

## 2019-11-19 ENCOUNTER — Other Ambulatory Visit: Payer: Self-pay

## 2019-11-19 DIAGNOSIS — E349 Endocrine disorder, unspecified: Secondary | ICD-10-CM

## 2019-11-19 DIAGNOSIS — E291 Testicular hypofunction: Secondary | ICD-10-CM

## 2019-11-19 DIAGNOSIS — N401 Enlarged prostate with lower urinary tract symptoms: Secondary | ICD-10-CM

## 2019-11-20 LAB — HEMOGLOBIN: Hemoglobin: 14.4 g/dL (ref 13.0–17.7)

## 2019-11-20 LAB — HEPATIC FUNCTION PANEL
ALT: 20 IU/L (ref 0–44)
AST: 19 IU/L (ref 0–40)
Albumin: 4.5 g/dL (ref 3.8–4.8)
Alkaline Phosphatase: 27 IU/L — ABNORMAL LOW (ref 48–121)
Bilirubin Total: 0.3 mg/dL (ref 0.0–1.2)
Bilirubin, Direct: 0.09 mg/dL (ref 0.00–0.40)
Total Protein: 6.5 g/dL (ref 6.0–8.5)

## 2019-11-20 LAB — HEMATOCRIT: Hematocrit: 43.5 % (ref 37.5–51.0)

## 2019-11-20 LAB — TESTOSTERONE: Testosterone: 574 ng/dL (ref 264–916)

## 2019-11-20 LAB — PSA: Prostate Specific Ag, Serum: 1 ng/mL (ref 0.0–4.0)

## 2019-11-20 NOTE — Progress Notes (Signed)
11/21/2019 1:23 PM   Jacob Pat Jr. 01/29/1951 [id number]  Referring provider: Baxter Hire, MD [place],  [place] [address]  Chief Complaint  Patient presents with  . Benign Prostatic Hypertrophy    HPI: Patient is a 69 year old male with testosterone deficiency, erectile dysfunction and BPH with lower urinary tract symptoms who presents today for 6 month follow-up.  Testosterone deficiency He is no longer having spontaneous erections at night.  He does not have sleep apnea.   He is currently managing his testosterone deficiency with Clomid 50 mg, 1 tablets daily.     Component     Latest Ref Rng & Units 11/19/2019  Testosterone     264 - 916 ng/dL 574   Component     Latest Ref Rng & Units 11/19/2019  HCT     37.5 - 51.0 % 43.5   Component     Latest Ref Rng & Units 11/19/2019  Hemoglobin     13.0 - 17.7 g/dL 14.4   Component     Latest Ref Rng & Units 11/19/2019  Total Protein     6.0 - 8.5 g/dL 6.5  Albumin     3.8 - 4.8 g/dL 4.5  Total Bilirubin     0.0 - 1.2 mg/dL 0.3  BILIRUBIN, DIRECT     0.00 - 0.40 mg/dL 0.09  Alkaline Phosphatase     48 - 121 IU/L 27 (L)  AST     0 - 40 IU/L 19  ALT     0 - 44 IU/L 20   Erectile dysfunction His SHIM score is 13, which is mild to moderate ED.   His previous SHIM score was 17.  He has been having difficulty with erections for the last few years.   His major complaint is still his confidence to keep an erection.  His libido is diminished.   His risk factors for ED are age, BPH, testosterone deficiency, spinal stenosis, DM, HTN, HLD and anxiety.   He denies any painful erections or curvatures with his erections.      SHIM    Row Name 11/21/19 1306         SHIM: Over the last 6 months:   How do you rate your confidence that you could get and keep an erection? Moderate     When you had erections with sexual stimulation, how often were your erections hard enough for penetration (entering  your partner)? A Few Times (much less than half the time)     During sexual intercourse, how often were you able to maintain your erection after you had penetrated (entered) your partner? Sometimes (about half the time)     During sexual intercourse, how difficult was it to maintain your erection to completion of intercourse? Difficult     When you attempted sexual intercourse, how often was it satisfactory for you? A Few Times (much less than half the time)       SHIM Total Score   SHIM 13            Score: 1-7 Severe ED 8-11 Moderate ED 12-16 Mild-Moderate ED 17-21 Mild ED 22-25 No ED   BPH WITH LUTS  (prostate and/or bladder) IPSS score: 6/2    Previous score: 3/0  Previous PVR: 83 mL  Denies any dysuria, hematuria or suprapubic pain.   His has had HoLEP on 05/25/2018 with Dr. Diamantina Providence.  Pathology benign.     Denies any recent fevers,  chills, nausea or vomiting.  He does not have a family history of PCa.   IPSS    Row Name 11/21/19 1300         International Prostate Symptom Score   How often have you had the sensation of not emptying your bladder? Not at All     How often have you had to urinate less than every two hours? Less than half the time     How often have you found you stopped and started again several times when you urinated? Less than 1 in 5 times     How often have you found it difficult to postpone urination? Less than 1 in 5 times     How often have you had a weak urinary stream? Less than 1 in 5 times     How often have you had to strain to start urination? Not at All     How many times did you typically get up at night to urinate? 1 Time     Total IPSS Score 6       Quality of Life due to urinary symptoms   If you were to spend the rest of your life with your urinary condition just the way it is now how would you feel about that? Mostly Satisfied            Score:  1-7 Mild 8-19 Moderate 20-35 Severe  PMH: Past Medical History:  Diagnosis  Date  . Adenomatous polyp   . Allergy   . Anxiety   . BPH (benign prostatic hypertrophy)   . Diabetes mellitus   . Diverticulosis   . GERD (gastroesophageal reflux disease)   . Hyperlipidemia   . Hypertension   . Rosacea   . Spinal stenosis    disc disease    Surgical History: Past Surgical History:  Procedure Laterality Date  . ASD REPAIR    . BACK SURGERY    . COLONOSCOPY WITH PROPOFOL N/A 10/16/2018   Procedure: COLONOSCOPY WITH PROPOFOL;  Surgeon: Toledo, Benay Pike, MD;  Location: [facility] ENDOSCOPY;  Service: Gastroenterology;  Laterality: N/A;  . HOLEP-LASER ENUCLEATION OF THE PROSTATE WITH MORCELLATION N/A 05/25/2018   Procedure: HOLEP-LASER ENUCLEATION OF THE PROSTATE WITH MORCELLATION;  Surgeon: Billey Co, MD;  Location: [facility] ORS;  Service: Urology;  Laterality: N/A;  . L5-S1 Diskectomy  7/13   Dr Arnoldo Morale  . LUMBAR FUSION     l4-l5  . SPINAL CORD DECOMPRESSION      Home Medications:  Allergies as of 11/21/2019   No Known Allergies     Medication List       Accurate as of November 21, 2019  1:23 PM. If you have any questions, ask your nurse or doctor.        Aleve PM 220-25 MG Tabs Generic drug: Naproxen Sod-diphenhydrAMINE Take 1 tablet by mouth at bedtime.   aspirin 81 MG tablet Take 81 mg by mouth at bedtime.   B-D ULTRAFINE III SHORT PEN 31G X 8 MM Misc Generic drug: Insulin Pen Needle USE AS DIRECTED   clomiPHENE 50 MG tablet Commonly known as: CLOMID TAKE 1 TABLET BY MOUTH ONCE A DAY   esomeprazole 40 MG capsule Commonly known as: NEXIUM Take 40 mg by mouth every other day.   fish oil-omega-3 fatty acids 1000 MG capsule Take 1 g by mouth 2 (two) times daily.   fluticasone 50 MCG/ACT nasal spray Commonly known as: FLONASE USE 2 SPRAYS IN EACH NOSTRIL  EVERY DAY What changed:   when to take this  reasons to take this   gabapentin 300 MG capsule Commonly known as: NEURONTIN Take 300 mg by mouth 2 (two) times daily.     GLUCOSAMINE-CHONDROITIN PO Take 1 tablet by mouth 2 (two) times daily.   ibuprofen 200 MG tablet Commonly known as: ADVIL Take 400-600 mg by mouth every 6 (six) hours as needed for headache or moderate pain.   ketotifen 0.025 % ophthalmic solution Commonly known as: ZADITOR Place 1 drop into both eyes daily.   Levemir FlexTouch 100 UNIT/ML FlexPen Generic drug: insulin detemir Inject 50 Units into the skin at bedtime.   Melatonin 10 MG Tabs Take 10 mg by mouth at bedtime.   metFORMIN 1000 MG tablet Commonly known as: GLUCOPHAGE TAKE ONE TABLET TWICE A DAY What changed: when to take this   Multi-betic Diabetes Tabs Take 2 tablets by mouth daily.   ONE TOUCH ULTRA TEST test strip Generic drug: glucose blood USE AS DIRECTED   ramipril 10 MG capsule Commonly known as: ALTACE Take 10 mg by mouth daily. What changed: Another medication with the same name was removed. Continue taking this medication, and follow the directions you see here. Changed by: Zara Council, PA-C   simvastatin 40 MG tablet Commonly known as: ZOCOR TAKE 1 TABLET EVERY DAY What changed: when to take this   Trulicity 1.5 VH/8.4ON Sopn Generic drug: Dulaglutide Inject 1.5 mg into the skin every 7 (seven) days. Thursday am   vitamin C 1000 MG tablet Take 1,000 mg by mouth daily.       Allergies: No Known Allergies  Family History: Family History  Problem Relation Age of Onset  . Diabetes Father   . Urolithiasis Father   . Alcohol abuse Paternal Aunt   . Kidney disease Cousin   . Kidney cancer Neg Hx   . Prostate cancer Neg Hx   . Bladder Cancer Neg Hx     Social History:  reports that he quit smoking about 12 years ago. His smoking use included cigars. He has never used smokeless tobacco. He reports current alcohol use. He reports that he does not use drugs.  ROS: For pertinent review of systems please refer to history of present illness  Physical Exam: BP (!) 148/75   Pulse (!)  52   Ht 5' (1.524 m)   Wt 165 lb (74.8 kg)   BMI 32.22 kg/m   Constitutional:  Well nourished. Alert and oriented, No acute distress. HEENT: [place] AT, mask in place.  Trachea midline Cardiovascular: No clubbing, cyanosis, or edema. Respiratory: Normal respiratory effort, no increased work of breathing. GU: No CVA tenderness.  No bladder fullness or masses.  Patient with circumcised phallus. Urethral meatus is patent.  No penile discharge. No penile lesions or rashes. Scrotum without lesions, cysts, rashes and/or edema.  Testicles are located scrotally bilaterally. No masses are appreciated in the testicles. Left and right epididymis are normal. Rectal: Patient with  normal sphincter tone. Anus and perineum without scarring or rashes. No rectal masses are appreciated. Prostate is approximately 50 grams, no nodules are appreciated. Seminal vesicles were not able to be palpated.  Skin: No rashes, bruises or suspicious lesions. Lymph: No inguinal adenopathy. Neurologic: Grossly intact, no focal deficits, moving all 4 extremities. Psychiatric: Normal mood and affect.   Laboratory Data: PSA  0.9 ng/mL on 01/02/2017 PSA 1.0 ng/mL on 07/05/2016 PSA 0.9 ng/mL on 07/04/2017 PSA 0.9 ng/mL on 03/02/2018 Lab Results  Component  Value Date   PSA 1.48 04/18/2011   PSA 1.24 03/08/2010   PSA 1.18 03/18/2009   Component     Latest Ref Rng & Units 09/28/2015 01/07/2016 07/05/2016 01/02/2017  Prostate Specific Ag, Serum     0.0 - 4.0 ng/mL 1.9 1.4 1.0 0.9   Component     Latest Ref Rng & Units 07/04/2017 03/02/2018 05/14/2019  Prostate Specific Ag, Serum     0.0 - 4.0 ng/mL 0.9 0.9 1.0    Lab Results  Component Value Date   TESTOSTERONE 574 11/19/2019    Lab Results  Component Value Date   AST 19 11/19/2019   Lab Results  Component Value Date   ALT 20 11/19/2019   TSH  1.530  02/2016  Specimen:  Blood  Ref Range & Units 4 mo ago  Glucose 70 - 110 mg/dL 86      Sodium 136 - 145 mmol/L 142        Potassium 3.6 - 5.1 mmol/L 4.0      Chloride 97 - 109 mmol/L 105      Carbon Dioxide (CO2) 22.0 - 32.0 mmol/L 31.7      Calcium 8.7 - 10.3 mg/dL 9.2      Urea Nitrogen (BUN) 7 - 25 mg/dL 14      Creatinine 0.7 - 1.3 mg/dL 0.6Low      Glomerular Filtration Rate (eGFR), MDRD Estimate >60 mL/min/1.73sq m 134      BUN/Crea Ratio 6.0 - 20.0  23.3High      Anion Gap w/K 6.0 - 16.0  9.3      Resulting Agency  [facility]  Specimen Collected: 07/19/19 9:12 AM Last Resulted: 07/19/19 11:38 AM  Received From: [facility]  Result Received: 08/13/19 3:17 PM    I have reviewed the labs.  Pertinent Imaging Results for Wailes, Jacob D JR. "DEL" (MRN [id number]) as of 06/01/2019 15:46  Ref. Range 05/21/2019 13:42  Scan Result Unknown [id number]    Assessment & Plan:    1. Testosterone deficiency -continue Clomid 50 mg, 1 tablet daily  -RTC in 12 months for HCT, testosterone, PSA, LFT's and exam  2. BPH with LUTS S/P HoLEP 04/2018 - very pleased with procedure  I PSS 6/2, it is worse RTC in 12 months for I PSS and exam  3. Erectile dysfunction:   -SHIM score 13, it is worsening - Not sexually active at this time  -RTC in 6 months for SHIM score and exam, as testosterone therapy can affect erections  Return in about 1 year (around 11/20/2020) for I PSS, testosterone (before 10 am) and exam .  These notes generated with voice recognition software. I apologize for typographical errors.  Zara Council, PA-C  [facility] [address] [place], [place] [address] [phone]

## 2019-11-21 ENCOUNTER — Ambulatory Visit (INDEPENDENT_AMBULATORY_CARE_PROVIDER_SITE_OTHER): Payer: Medicare HMO | Admitting: Urology

## 2019-11-21 ENCOUNTER — Other Ambulatory Visit: Payer: Self-pay

## 2019-11-21 ENCOUNTER — Encounter: Payer: Self-pay | Admitting: Urology

## 2019-11-21 VITALS — BP 148/75 | HR 52 | Ht 60.0 in | Wt 165.0 lb

## 2019-11-21 DIAGNOSIS — E349 Endocrine disorder, unspecified: Secondary | ICD-10-CM | POA: Diagnosis not present

## 2019-11-21 DIAGNOSIS — N529 Male erectile dysfunction, unspecified: Secondary | ICD-10-CM

## 2019-11-21 DIAGNOSIS — N401 Enlarged prostate with lower urinary tract symptoms: Secondary | ICD-10-CM | POA: Diagnosis not present

## 2020-01-21 ENCOUNTER — Other Ambulatory Visit (HOSPITAL_COMMUNITY): Payer: Self-pay | Admitting: Sports Medicine

## 2020-01-21 ENCOUNTER — Other Ambulatory Visit: Payer: Self-pay | Admitting: Sports Medicine

## 2020-01-21 DIAGNOSIS — M76891 Other specified enthesopathies of right lower limb, excluding foot: Secondary | ICD-10-CM

## 2020-01-21 DIAGNOSIS — M1711 Unilateral primary osteoarthritis, right knee: Secondary | ICD-10-CM

## 2020-01-21 DIAGNOSIS — G8929 Other chronic pain: Secondary | ICD-10-CM

## 2020-01-30 ENCOUNTER — Other Ambulatory Visit: Payer: Self-pay

## 2020-01-30 ENCOUNTER — Ambulatory Visit
Admission: RE | Admit: 2020-01-30 | Discharge: 2020-01-30 | Disposition: A | Discharge status: Home / Self Care | Payer: Medicare HMO | Source: Ambulatory Visit | Attending: Sports Medicine | Admitting: Sports Medicine

## 2020-01-30 DIAGNOSIS — M25561 Pain in right knee: Secondary | ICD-10-CM | POA: Insufficient documentation

## 2020-01-30 DIAGNOSIS — G8929 Other chronic pain: Secondary | ICD-10-CM

## 2020-01-30 DIAGNOSIS — M1711 Unilateral primary osteoarthritis, right knee: Secondary | ICD-10-CM

## 2020-01-30 DIAGNOSIS — M76891 Other specified enthesopathies of right lower limb, excluding foot: Secondary | ICD-10-CM | POA: Diagnosis present

## 2020-02-17 ENCOUNTER — Other Ambulatory Visit: Payer: Self-pay | Admitting: Urology

## 2020-02-17 DIAGNOSIS — E349 Endocrine disorder, unspecified: Secondary | ICD-10-CM

## 2020-03-29 NOTE — Discharge Instructions (Signed)
  Instructions after Knee Arthroscopy    Meshell Abdulaziz P. Marialuiza Car, Jr., M.D.     Dept. of Orthopaedics & Sports Medicine  [facility]  [address]  [place], [place]  [address]   Phone: [phone]   Fax: [phone]   DIET: Drink plenty of non-alcoholic fluids & begin a light diet. Resume your normal diet the day after surgery.  ACTIVITY:  You may use crutches or a walker with weight-bearing as tolerated, unless instructed otherwise. You may wean yourself off of the walker or crutches as tolerated.  Begin doing gentle exercises. Exercising will reduce the pain and swelling, increase motion, and prevent muscle weakness.   Avoid strenuous activities or athletics for a minimum of 4-6 weeks after arthroscopic surgery. Do not drive or operate any equipment until instructed.  WOUND CARE:  Place one to two pillows under the knee the first day or two when sitting or lying.  Continue to use the ice packs periodically to reduce pain and swelling. The small incisions in your knee are closed with nylon stitches. The stitches will be removed in the office. The bulky dressing may be removed on the second day after surgery. DO NOT TOUCH THE STITCHES. Put a Band-Aid over each stitch. Do NOT use any ointments or creams on the incisions.  You may bathe or shower after the stitches are removed at the first office visit following surgery.  MEDICATIONS: You may resume your regular medications. Please take the pain medication as prescribed. Do not take pain medication on an empty stomach. Do not drive or drink alcoholic beverages when taking pain medications.  CALL THE OFFICE FOR: Temperature above 101 degrees Excessive bleeding or drainage on the dressing. Excessive swelling, coldness, or paleness of the toes. Persistent nausea and vomiting.  FOLLOW-UP:  You should have an appointment to return to the office in 7-10 days after surgery.     [facility] Department Directory          [email or web address]       [email or web address]          Cardiology  Appointments: [place] - [phone] [place] - [phone]  Endocrinology  Appointments: [place] - [phone] [place] - [phone]  Gastroenterology  Appointments: [place] - [phone] [place] - [phone]        General Surgery   Appointments: [place] - [phone]  Internal Medicine/Family Medicine  Appointments: [place] - [phone] [place] - [phone] [place] - [phone]  Metabolic and Weigh Loss Surgery  Appointments: [place] - [phone]        Neurology  Appointments: [place] - [phone] [place] - [phone]  Neurosurgery  Appointments: [place] - [phone]  Obstetrics & Gynecology  Appointments: [place] - [phone] [place] - [phone]        Pediatrics  Appointments: [place] - [phone] [place] - [phone]  Physiatry  Appointments: [place] -[phone]  Physical Therapy  Appointments: [place] - [phone] [place] - [phone]        Podiatry  Appointments: [place] - [phone] [place] - [phone]  Pulmonology  Appointments: [place] - [phone]  Rheumatology  Appointments: [place] - [phone]        [place] Location: [facility]  [address] [place], [place]  [address]  [place] Location: [facility] [address] [place], [place]  [address]  [place] Location: [facility] [address] [place],   [address]    

## 2020-04-03 ENCOUNTER — Telehealth: Payer: Self-pay | Admitting: Family Medicine

## 2020-04-03 NOTE — Telephone Encounter (Signed)
Informed patient Clomid has been approved. Approval dates 03/28/2020-03/27/2021.

## 2020-04-06 ENCOUNTER — Inpatient Hospital Stay: Admission: RE | Admit: 2020-04-06 | Payer: Medicare HMO | Source: Ambulatory Visit

## 2020-04-06 NOTE — Patient Instructions (Signed)
Your procedure is scheduled on:04/15/2020- Weds Report to the Registration Desk on the 1st floor of the [facility]. To find out your arrival time, please call [phone] between 1PM - 3PM on: 04/14/2009- Tues  REMEMBER: Instructions that are not followed completely may result in serious medical risk, up to and including death; or upon the discretion of your surgeon and anesthesiologist your surgery may need to be rescheduled.  Do not eat food after midnight the night before surgery.  No gum chewing, lozengers or hard candies.  You may however, drink CLEAR liquids up to 2 hours before you are scheduled to arrive for your surgery. Do not drink anything within 2 hours of your scheduled arrival time.  Type 1 and Type 2 diabetics should only drink water.  In addition, your doctor has ordered for you to drink the provided  Ensure Pre-Surgery Clear Carbohydrate Drink  Gatorade G2 Drinking this carbohydrate drink up to two hours before surgery helps to reduce insulin resistance and improve patient outcomes. Please complete drinking 2 hours prior to scheduled arrival time.  TAKE THESE MEDICATIONS THE MORNING OF SURGERY WITH A SIP OF WATER:  (take one the night before and one on the morning of surgery - helps to prevent nausea after surgery.)  Use inhalers on the day of surgery and bring to the hospital.  Stop Metformin and Janumet 2 days prior to surgery.  Take 1/2 of usual insulin dose the night before surgery and none on the morning of surgery.  Follow recommendations from Cardiologist, Pulmonologist or PCP regarding stopping Aspirin, Coumadin, Plavix, Eliquis, Pradaxa, or Pletal.  One week prior to surgery: Stop Anti-inflammatories (NSAIDS) such as Advil, Aleve, Ibuprofen, Motrin, Naproxen, Naprosyn and Aspirin based products such as Excedrin, Goodys Powder, BC Powder. Stop ANY OVER THE COUNTER supplements until after surgery. (However, you may continue taking Vitamin D, Vitamin B,  and multivitamin up until the day before surgery.)  No Alcohol for 24 hours before or after surgery.  No Smoking including e-cigarettes for 24 hours prior to surgery.  No chewable tobacco products for at least 6 hours prior to surgery.  No nicotine patches on the day of surgery.  Do not use any "recreational" drugs for at least a week prior to your surgery.  Please be advised that the combination of cocaine and anesthesia may have negative outcomes, up to and including death. If you test positive for cocaine, your surgery will be cancelled.  On the morning of surgery brush your teeth with toothpaste and water, you may rinse your mouth with mouthwash if you wish. Do not swallow any toothpaste or mouthwash.  Do not wear jewelry, make-up, hairpins, clips or nail polish.  Do not wear lotions, powders, or perfumes.   Do not shave body from the neck down 48 hours prior to surgery just in case you cut yourself which could leave a site for infection.  Also, freshly shaved skin may become irritated if using the CHG soap.  Contact lenses, hearing aids and dentures may not be worn into surgery.  Do not bring valuables to the hospital. [facility] is not responsible for any missing/lost belongings or valuables.   Use CHG Soap or wipes as directed on instruction sheet.  Total Shoulder Arthroplasty:  use Benzolyl Peroxide 5% Gel as directed on instruction sheet.  Fleets enema or Magnesium Citrate as directed.  Bring your C-PAP to the hospital with you in case you may have to spend the night.   Notify your doctor  if there is any change in your medical condition (cold, fever, infection).  Wear comfortable clothing (specific to your surgery type) to the hospital.  Plan for stool softeners for home use; pain medications have a tendency to cause constipation. You can also help prevent constipation by eating foods high in fiber such as fruits and vegetables and drinking plenty of fluids as your  diet allows.  After surgery, you can help prevent lung complications by doing breathing exercises.  Take deep breaths and cough every 1-2 hours. Your doctor may order a device called an Incentive Spirometer to help you take deep breaths. When coughing or sneezing, hold a pillow firmly against your incision with both hands. This is called "splinting." Doing this helps protect your incision. It also decreases belly discomfort.  If you are being admitted to the hospital overnight, leave your suitcase in the car. After surgery it may be brought to your room.  If you are being discharged the day of surgery, you will not be allowed to drive home. You will need a responsible adult (18 years or older) to drive you home and stay with you that night.   If you are taking public transportation, you will need to have a responsible adult (18 years or older) with you. Please confirm with your physician that it is acceptable to use public transportation.   Please call the [facility] Dept. at [phone] if you have any questions about these instructions.  Visitation Policy:  Patients undergoing a surgery or procedure may have one family member or support person with them as long as that person is not COVID-19 positive or experiencing its symptoms.  That person may remain in the waiting area during the procedure.  Inpatient Visitation:    Visiting hours are 7 a.m. to 8 p.m. Patients will be allowed one visitor. The visitor may change daily. The visitor must pass COVID-19 screenings, use hand sanitizer when entering and exiting the patient's room and wear a mask at all times, including in the patient's room. Patients must also wear a mask when staff or their visitor are in the room. Masking is required regardless of vaccination status. Systemwide, no visitors 17 or younger.

## 2020-04-07 ENCOUNTER — Encounter
Admission: RE | Admit: 2020-04-07 | Discharge: 2020-04-07 | Disposition: A | Discharge status: Home / Self Care | Payer: Medicare HMO | Source: Ambulatory Visit | Attending: Orthopedic Surgery | Admitting: Orthopedic Surgery

## 2020-04-07 ENCOUNTER — Other Ambulatory Visit: Payer: Self-pay

## 2020-04-07 HISTORY — DX: Personal history of urinary calculi: Z87.442

## 2020-04-07 NOTE — Patient Instructions (Signed)
Your procedure is scheduled on: Wednesday 04/15/20.  Report to THE FIRST FLOOR REGISTRATION DESK IN THE MEDICAL MALL ON THE MORNING OF SURGERY FIRST, THEN YOU WILL CHECK IN AT THE SURGERY INFORMATION DESK LOCATED OUTSIDE THE SAME DAY SURGERY DEPARTMENT LOCATED ON 2ND FLOOR MEDICAL MALL ENTRANCE.  To find out your arrival time please call [phone] between 1PM - 3PM on Tuesday 04/14/20.    Remember: Instructions that are not followed completely may result in serious medical risk, up to and including death, or upon the discretion of your surgeon and anesthesiologist your surgery may need to be rescheduled.     __X__ 1. Do not eat food after midnight the night before your procedure.                 No gum chewing or hard candies. You may drink SUGAR FREE clear liquids up to 2 hours                 before you are scheduled to arrive for your surgery- DO NOT drink clear                 liquids within 2 hours of the start of your surgery.                  __X__2.  On the morning of surgery brush your teeth with toothpaste and water, you may rinse your mouth with mouthwash if you wish.  Do not swallow any toothpaste or mouthwash.    __X__ 3.  No Alcohol for 24 hours before or after surgery.  __X__ 4.  Do Not Smoke or use e-cigarettes For 24 Hours Prior to Your Surgery.                 Do not use any chewable tobacco products for at least 6 hours prior to                 surgery.  __X__5.  Notify your doctor if there is any change in your medical condition      (cold, fever, infections).      Do NOT wear jewelry, make-up, hairpins, clips or nail polish. Do NOT wear lotions, powders, or perfumes.  Do NOT shave 48 hours prior to surgery. Men may shave face and neck. Do NOT bring valuables to the hospital.     [facility] is not responsible for any belongings or valuables.   Contacts, dentures/partials or body piercings may not be worn into surgery. Bring a case for your contacts, glasses  or hearing aids, a denture cup will be supplied.     Patients discharged the day of surgery will not be allowed to drive home.     __X__ Take these medicines the morning of surgery with A SIP OF WATER:     1. gabapentin (NEURONTIN)   2. esomeprazole (NEXIUM)    __X__ Use CHG Soap as directed.  __X__ Stop Metformin 2 days prior to surgery. Your last dose will be on Sunday 04/12/20.    __X__ Take 1/2 of usual insulin dose the night before surgery. No insulin the morning of surgery.   __X__ Stop Blood Thinners: Aspirin.  Stop this a week prior to your procedure.  __X__ Stop Anti-inflammatories 7 days before surgery such as Advil, Ibuprofen, Motrin, BC or Goodies Powder, Naprosyn, Naproxen, Aleve, Aspirin, Meloxicam. May take Tylenol if needed for pain or discomfort.   __X__Do not start taking any new herbal supplements or vitamins prior  to your procedure.  __X__ Stop the following herbal supplements or vitamins:  Ascorbic Acid (VITAMIN C)     Wear comfortable clothing (specific to your surgery type) to the hospital.  Plan for stool softeners for home use; pain medications have a tendency to cause constipation. You can also help prevent constipation by eating foods high in fiber such as fruits and vegetables and drinking plenty of fluids as your diet allows.  After surgery, you can prevent lung complications by doing breathing exercises.Take deep breaths and cough every 1-2 hours. Your doctor may order a device called an Incentive Spirometer to help you take deep breaths.  Please call the [place] Department at [phone] if you have any questions about these instructions.

## 2020-04-08 ENCOUNTER — Encounter
Admission: RE | Admit: 2020-04-08 | Discharge: 2020-04-08 | Disposition: A | Discharge status: Home / Self Care | Payer: Medicare HMO | Source: Ambulatory Visit | Attending: Orthopedic Surgery | Admitting: Orthopedic Surgery

## 2020-04-08 DIAGNOSIS — Z01810 Encounter for preprocedural cardiovascular examination: Secondary | ICD-10-CM | POA: Insufficient documentation

## 2020-04-08 DIAGNOSIS — E119 Type 2 diabetes mellitus without complications: Secondary | ICD-10-CM | POA: Diagnosis not present

## 2020-04-13 ENCOUNTER — Inpatient Hospital Stay: Admission: RE | Admit: 2020-04-13 | Payer: Medicare HMO | Source: Ambulatory Visit

## 2020-04-13 NOTE — Progress Notes (Signed)
No show for covid test today. Call to patient, unable to come due to the weather. Instructed to come tomorrow morning, acknowledged understanding.

## 2020-04-14 ENCOUNTER — Encounter: Payer: Self-pay | Admitting: Orthopedic Surgery

## 2020-04-14 ENCOUNTER — Other Ambulatory Visit
Admission: RE | Admit: 2020-04-14 | Discharge: 2020-04-14 | Disposition: A | Discharge status: Home / Self Care | Payer: Medicare HMO | Source: Ambulatory Visit | Attending: Orthopedic Surgery | Admitting: Orthopedic Surgery

## 2020-04-14 ENCOUNTER — Other Ambulatory Visit: Payer: Self-pay

## 2020-04-14 DIAGNOSIS — Z01812 Encounter for preprocedural laboratory examination: Secondary | ICD-10-CM | POA: Diagnosis present

## 2020-04-14 DIAGNOSIS — Z20822 Contact with and (suspected) exposure to covid-19: Secondary | ICD-10-CM | POA: Insufficient documentation

## 2020-04-14 LAB — SARS CORONAVIRUS 2 (TAT 6-24 HRS): SARS Coronavirus 2: NEGATIVE

## 2020-04-14 NOTE — H&P (Signed)
ORTHOPAEDIC HISTORY & PHYSICAL Regino Bellow, PA - 04/08/2020 11:00 AM EST Chief Complaint No chief complaint on file.  Reason for Visit Jacob Perkins. is a 70 y.o. who presents today for history and physical. He is to undergo right knee arthroscopy on 04/15/2020. He was last seen in clinic on 03/03/2020. There is been no change in his condition since that time.  He reports a 5 year history of right knee pain. He had a remote history of a right MCL injury that was treated nonoperatively. He localizes most of the pain along the medial aspect of the knee. He reports minimal swelling, no locking, and moderate giving way of the knee. He has noticed more frequent catching and "popping" of the knee with a sensation that "something is sliding or moving in the knee. The pain is aggravated by lateral movements, pivoting and squatting. The patient has not appreciated any significant improvement despite activity modification, bracing, glucosamine/chondroitin, NSAIDs, and viscosupplementation. He has stopped playing golf due to the symptoms. Patient states that the pain is increased to the point that he wishes to proceed with having surgery.  Past Medical History Past Medical History:  Diagnosis Date  . Adenomatous polyp  . Allergy  . Anxiety  . Arthritis  . BPH (benign prostatic hypertrophy)  . Diabetes mellitus (CMS-HCC)  . Esophageal reflux 09/01/2006  . Hyperlipidemia  . Hypertension  . Low testosterone  Managed at [facility]  . Rosacea  . Spinal stenosis   Past Surgical History Past Surgical History:  Procedure Laterality Date  . ASD Repair  . COLONOSCOPY 10/16/2018  Diverticulosis/PHx CP/Repeat 5yrs/TKT  . POSTERIOR FUSION LUMBAR SPINE N/A feb 2009  . PROSTATE SURGERY 05/25/2018   Past Family History Family History  Problem Relation Age of Onset  . Angina Maternal Grandmother  . Stroke Paternal Grandmother  . Angina Paternal Grandmother  . Stroke Paternal  Grandfather  . Diabetes type II Father  . Hip fracture Mother  . Obesity Mother  . Osteoporosis (Thinning of bones) Mother   Medications Current Outpatient Medications Ordered in Epic  Medication Sig Dispense Refill  . ascorbic acid, vitamin C, (VITAMIN C) 1000 MG tablet Take 1,000 mg by mouth once daily  . aspirin 81 MG EC tablet Take 81 mg by mouth once daily  . clomiPHENE (CLOMID) 50 mg tablet TAKE 1 TABLET BY MOUTH DAILY.  . diazePAM (VALIUM) 5 MG tablet Take 1 tablet (5 mg total) by mouth every 12 (twelve) hours as needed for Anxiety for up to 30 days 60 tablet 2  . docosahexaenoic acid-epa 120-180 mg Cap Take 1 g by mouth once daily  . EASY TOUCH 31 gauge x 5/16" needle USE AS DIRECTED WITH INSULIN SHOTS 100 each 3  . esomeprazole (NEXIUM) 40 MG DR capsule Take 1 capsule (40 mg total) by mouth once daily for 180 days 90 capsule 1  . fluticasone propionate (FLONASE) 50 mcg/actuation nasal spray PLACE 2 SPRAYS INTO BOTH NOSTRILS DAILY 16 g 2  . gabapentin (NEURONTIN) 300 MG capsule TAKE 1 CAPSULE BY MOUTH TWICE DAILY 60 capsule 11  . glucosamine/chondr su A sod (GLUCOSAMINE-CHONDROITIN) 167-133 mg Cap Take by mouth  . hydrocortisone 1 % cream Apply topically once daily as needed  . ketotifen (ZADITOR) 0.025 % (0.035 %) ophthalmic solution Apply to eye once daily as needed  . LEVEMIR FLEXTOUCH U-100 INSULN pen injector (concentration 100 units/mL) INJECT 50 UNITS SUBCUTANEOUSLY NIGHTLY (Patient taking differently: Inject 40 Units subcutaneously once daily ) 45  mL 2  . melatonin 10 mg Tab Take 10 mg by mouth nightly  . metFORMIN (GLUCOPHAGE) 1000 MG tablet TAKE 1 TABLET BY MOUTH TWICE A DAY WITH MEALS 180 tablet 3  . multivit with min-folic acid (ADULT ONE DAILY GUMMIES) 200 mcg Chew Take by mouth once daily  . ONETOUCH ULTRA BLUE TEST STRIP test strip USE ONE STRIP TO TEST BLOOD SUGAR THREE TIMES A DAY AS INSTRUCTED 100 each 5  . ramipriL (ALTACE) 10 MG capsule Take 1 capsule (10 mg  total) by mouth once daily 90 capsule 3  . simvastatin (ZOCOR) 40 MG tablet TAKE ONE TABLET BY MOUTH EVERY OTHER DAY 90 tablet 1  . TRULICITY 3 GG/2.6 mL pen injector INJECT 0.5MLS SUB Q ONCE A WEEK (Patient taking differently: 3 mg ) 2 mL 5   No current Epic-ordered facility-administered medications on file.   Allergies No Known Allergies   Review of Systems A comprehensive 14 point ROS was performed, reviewed, and the pertinent orthopaedic findings are documented in the HPI.  Exam BP 154/66  Ht 162.6 cm (5\' 4" )  Wt 78.7 kg (173 lb 6.4 oz)  BMI 29.76 kg/m   General: Well-developed well-nourished male seen in no acute distress.   HEENT: Atraumatic,normocephalic. Pupils are equal and reactive to light. Oropharynx is clear with moist mucosa  Lungs: Clear to auscultation bilaterally   Cardiovascular: Regular rate and rhythm. Normal S1, S2. No murmurs. No appreciable gallops or rubs. Peripheral pulses are palpable.  Abdomen: Soft, non-tender, nondistended. Bowel sounds present  Extremity: Right Knee:  Soft tissue swelling: minimal Effusion: none Erythema: none Crepitance: moderate Tenderness: medial Alignment: normal Mediolateral laxity: Mild medial opening Anterior drawer test:negative Lachman`s test: negative McMurray`s test: positive Atrophy: No significant atrophy.  Quadriceps tone was fair to good. Range of Motion: greater than 120 degrees   Neurological:  The patient is alert and oriented Sensation to light touch appears to be intact and within normal limits Gross motor strength appeared to be equal to 5/5  Vascular :  Peripheral pulses felt to be palpable. Capillary refill appears to be intact and within normal limits  X-ray  1.  I reviewed the right knee MRI from [facility] dated 01/31/2020. I concur with the radiologist's interpretation as below:  MRI: MRI OF THE RIGHT KNEE WITHOUT CONTRAST   TECHNIQUE:  Multiplanar,  multisequence MR imaging of the knee was performed. No  intravenous contrast was administered.   COMPARISON: None.   FINDINGS:  MENISCI   Medial meniscus: Grade 3 oblique tear of the midbody extending to  the inferior surface on image 13 of series 6. Somewhat diminutive  medial meniscus with mild free edge irregularity and fraying along  the posterior horn.   Lateral meniscus: No tear. Discoid variant.   LIGAMENTS   Cruciates: He attenuated ACL with a slope less than that of  Blumensaat's line for example on image 16/8, compatible with chronic  ACL tear. PCL seems thinned or attenuated proximally, query partial  chronic tear.   Collaterals: Fluid signal tracks along the deep, anterior, and  superficial margin of the MCL on image 18/3 and is probably the  recur residuum of a prior MCL tear. The MCL is not completely  discontinuous. Fibular collateral ligament intact.   CARTILAGE   Patellofemoral: Moderate chondral thinning in the femoral trochlear  groove with mild to moderate chondral thinning along the lateral  patellar facet. Mild marginal spurring.   Medial: Moderate degenerative chondral thinning with marginal  spurring.   Lateral: Mild degenerative chondral thinning with marginal spurring.   Joint: Small knee effusion. Mildly thickened medial plica. Low-grade  synovitis along the suprapatellar bursa.   Popliteal Fossa: Unremarkable   Extensor Mechanism: Mild patellar tendinopathy.   Bones: No significant extra-articular osseous abnormalities  identified.   Other: No supplemental non-categorized findings.   IMPRESSION:  1. Grade 3 oblique tear of the midbody medial meniscus extending to  the inferior surface. Somewhat diminutive medial meniscus with mild  free edge irregularity and fraying along the posterior horn.  2. Chronic ACL tear. Attenuated or attenuated proximal PCL, query  partial chronic tear.  3. Fluid signal tracks along the deep, anterior,  and superficial  margin of the MCL and is probably the recur residuum of a prior MCL  tear.  4. Small knee effusion with mildly thickened medial plica and  low-grade synovitis along the suprapatellar bursa.  5. Mild patellar tendinopathy.   Electronically Signed  By: Van Clines M.D.  On: 01/31/2020 08:54   Impression  1. Internal derangement right knee 2. Degenerative arthrosis right knee  Plan   1. Patient currently taking no anticoagulation medication 2. Did discuss postop course 3. Return to clinic 1 week postop. Sooner if any problems  This note was generated in part with voice recognition software and I apologize for any typographical errors that were not detected and corrected   Watt Climes PA  Electronically signed by Regino Bellow, PA at 04/08/2020 12:35 PM EST

## 2020-04-15 ENCOUNTER — Encounter: Payer: Self-pay | Admitting: Orthopedic Surgery

## 2020-04-15 ENCOUNTER — Other Ambulatory Visit: Payer: Self-pay

## 2020-04-15 ENCOUNTER — Ambulatory Visit
Admission: RE | Admit: 2020-04-15 | Discharge: 2020-04-15 | Disposition: A | Discharge status: Home / Self Care | Payer: Medicare HMO | Attending: Orthopedic Surgery | Admitting: Orthopedic Surgery

## 2020-04-15 ENCOUNTER — Ambulatory Visit: Payer: Medicare HMO | Admitting: Anesthesiology

## 2020-04-15 ENCOUNTER — Encounter
Admission: RE | Disposition: A | Discharge status: Home / Self Care | Payer: Self-pay | Source: Home / Self Care | Attending: Orthopedic Surgery

## 2020-04-15 DIAGNOSIS — Z7984 Long term (current) use of oral hypoglycemic drugs: Secondary | ICD-10-CM | POA: Insufficient documentation

## 2020-04-15 DIAGNOSIS — M1711 Unilateral primary osteoarthritis, right knee: Secondary | ICD-10-CM | POA: Diagnosis not present

## 2020-04-15 DIAGNOSIS — Z7982 Long term (current) use of aspirin: Secondary | ICD-10-CM | POA: Insufficient documentation

## 2020-04-15 DIAGNOSIS — M2391 Unspecified internal derangement of right knee: Secondary | ICD-10-CM | POA: Diagnosis present

## 2020-04-15 DIAGNOSIS — M23221 Derangement of posterior horn of medial meniscus due to old tear or injury, right knee: Secondary | ICD-10-CM | POA: Insufficient documentation

## 2020-04-15 DIAGNOSIS — Z9889 Other specified postprocedural states: Secondary | ICD-10-CM

## 2020-04-15 DIAGNOSIS — Z79899 Other long term (current) drug therapy: Secondary | ICD-10-CM | POA: Diagnosis not present

## 2020-04-15 DIAGNOSIS — Z794 Long term (current) use of insulin: Secondary | ICD-10-CM | POA: Insufficient documentation

## 2020-04-15 HISTORY — DX: Unspecified osteoarthritis, unspecified site: M19.90

## 2020-04-15 HISTORY — PX: KNEE ARTHROSCOPY: SHX127

## 2020-04-15 LAB — GLUCOSE, CAPILLARY
Glucose-Capillary: 146 mg/dL — ABNORMAL HIGH (ref 70–99)
Glucose-Capillary: 150 mg/dL — ABNORMAL HIGH (ref 70–99)

## 2020-04-15 SURGERY — ARTHROSCOPY, KNEE
Anesthesia: General | Site: Knee | Laterality: Right

## 2020-04-15 MED ORDER — SODIUM CHLORIDE 0.9 % IV SOLN
INTRAVENOUS | Status: DC
  Administered 2020-04-15: 10 mL/h via INTRAVENOUS

## 2020-04-15 MED ORDER — OXYCODONE HCL 5 MG PO TABS: 5.0000 mg | ORAL_TABLET | Freq: Once | ORAL | Status: DC | PRN

## 2020-04-15 MED ORDER — ONDANSETRON HCL 4 MG/2ML IJ SOLN: 4.0000 mg | Freq: Four times a day (QID) | INTRAMUSCULAR | Status: DC | PRN

## 2020-04-15 MED ORDER — ORAL CARE MOUTH RINSE: 15.0000 mL | Freq: Once | OROMUCOSAL | Status: AC

## 2020-04-15 MED ORDER — FENTANYL CITRATE (PF) 100 MCG/2ML IJ SOLN
INTRAMUSCULAR | Status: DC | PRN
  Administered 2020-04-15 (×2): 50 ug via INTRAVENOUS

## 2020-04-15 MED ORDER — MORPHINE SULFATE (PF) 2 MG/ML IV SOLN: 0.5000 mg | INTRAVENOUS | Status: DC | PRN

## 2020-04-15 MED ORDER — LIDOCAINE HCL (CARDIAC) PF 100 MG/5ML IV SOSY
PREFILLED_SYRINGE | INTRAVENOUS | Status: DC | PRN
  Administered 2020-04-15: 60 mg via INTRAVENOUS

## 2020-04-15 MED ORDER — GLYCOPYRROLATE 0.2 MG/ML IJ SOLN
INTRAMUSCULAR | Status: AC
  Filled 2020-04-15: qty 1

## 2020-04-15 MED ORDER — PROPOFOL 10 MG/ML IV BOLUS
INTRAVENOUS | Status: DC | PRN
  Administered 2020-04-15: 150 mg via INTRAVENOUS

## 2020-04-15 MED ORDER — GLYCOPYRROLATE 0.2 MG/ML IJ SOLN
INTRAMUSCULAR | Status: DC | PRN
  Administered 2020-04-15: .2 mg via INTRAVENOUS

## 2020-04-15 MED ORDER — CELECOXIB 200 MG PO CAPS: 400.0000 mg | ORAL_CAPSULE | Freq: Once | ORAL | Status: AC

## 2020-04-15 MED ORDER — ONDANSETRON HCL 4 MG PO TABS: 4.0000 mg | ORAL_TABLET | Freq: Four times a day (QID) | ORAL | Status: DC | PRN

## 2020-04-15 MED ORDER — HYDROCODONE-ACETAMINOPHEN 5-325 MG PO TABS: 1.0000 | ORAL_TABLET | ORAL | Status: DC | PRN

## 2020-04-15 MED ORDER — BUPIVACAINE-EPINEPHRINE 0.25% -1:200000 IJ SOLN
INTRAMUSCULAR | Status: DC | PRN
  Administered 2020-04-15: 25 mL
  Administered 2020-04-15: 5 mL

## 2020-04-15 MED ORDER — MIDAZOLAM HCL 2 MG/2ML IJ SOLN
INTRAMUSCULAR | Status: AC
  Filled 2020-04-15: qty 2

## 2020-04-15 MED ORDER — SODIUM CHLORIDE 0.9 % IV SOLN: INTRAVENOUS | Status: DC

## 2020-04-15 MED ORDER — MIDAZOLAM HCL 2 MG/2ML IJ SOLN
INTRAMUSCULAR | Status: DC | PRN
  Administered 2020-04-15: 2 mg via INTRAVENOUS

## 2020-04-15 MED ORDER — ONDANSETRON HCL 4 MG/2ML IJ SOLN
INTRAMUSCULAR | Status: AC
  Filled 2020-04-15: qty 2

## 2020-04-15 MED ORDER — METOCLOPRAMIDE HCL 10 MG PO TABS: 5.0000 mg | ORAL_TABLET | Freq: Three times a day (TID) | ORAL | Status: DC | PRN

## 2020-04-15 MED ORDER — METOCLOPRAMIDE HCL 5 MG/ML IJ SOLN: 5.0000 mg | Freq: Three times a day (TID) | INTRAMUSCULAR | Status: DC | PRN

## 2020-04-15 MED ORDER — CHLORHEXIDINE GLUCONATE 0.12 % MT SOLN: 15.0000 mL | Freq: Once | OROMUCOSAL | Status: AC

## 2020-04-15 MED ORDER — BUPIVACAINE-EPINEPHRINE (PF) 0.25% -1:200000 IJ SOLN
INTRAMUSCULAR | Status: AC
  Filled 2020-04-15: qty 30

## 2020-04-15 MED ORDER — MORPHINE SULFATE (PF) 4 MG/ML IV SOLN
INTRAVENOUS | Status: AC
  Filled 2020-04-15: qty 1

## 2020-04-15 MED ORDER — FENTANYL CITRATE (PF) 100 MCG/2ML IJ SOLN: 25.0000 ug | INTRAMUSCULAR | Status: DC | PRN

## 2020-04-15 MED ORDER — HYDROCODONE-ACETAMINOPHEN 7.5-325 MG PO TABS: 1.0000 | ORAL_TABLET | ORAL | Status: DC | PRN

## 2020-04-15 MED ORDER — ACETAMINOPHEN 10 MG/ML IV SOLN
INTRAVENOUS | Status: AC
  Filled 2020-04-15: qty 100

## 2020-04-15 MED ORDER — DEXAMETHASONE SODIUM PHOSPHATE 10 MG/ML IJ SOLN
INTRAMUSCULAR | Status: DC | PRN
  Administered 2020-04-15: 5 mg via INTRAVENOUS

## 2020-04-15 MED ORDER — HYDROCODONE-ACETAMINOPHEN 5-325 MG PO TABS: 1.0000 | ORAL_TABLET | ORAL | 0 refills | Status: DC | PRN

## 2020-04-15 MED ORDER — LIDOCAINE HCL (PF) 2 % IJ SOLN
INTRAMUSCULAR | Status: AC
  Filled 2020-04-15: qty 5

## 2020-04-15 MED ORDER — OXYCODONE HCL 5 MG/5ML PO SOLN: 5.0000 mg | Freq: Once | ORAL | Status: DC | PRN

## 2020-04-15 MED ORDER — FENTANYL CITRATE (PF) 100 MCG/2ML IJ SOLN
INTRAMUSCULAR | Status: AC
  Filled 2020-04-15: qty 2

## 2020-04-15 MED ORDER — CHLORHEXIDINE GLUCONATE 0.12 % MT SOLN
OROMUCOSAL | Status: AC
  Administered 2020-04-15: 15 mL via OROMUCOSAL
  Filled 2020-04-15: qty 15

## 2020-04-15 MED ORDER — EPHEDRINE SULFATE 50 MG/ML IJ SOLN
INTRAMUSCULAR | Status: DC | PRN
  Administered 2020-04-15: 10 mg via INTRAVENOUS
  Administered 2020-04-15: 5 mg via INTRAVENOUS

## 2020-04-15 MED ORDER — ACETAMINOPHEN 10 MG/ML IV SOLN
INTRAVENOUS | Status: DC | PRN
  Administered 2020-04-15: 1000 mg via INTRAVENOUS

## 2020-04-15 MED ORDER — MORPHINE SULFATE 4 MG/ML IJ SOLN
INTRAMUSCULAR | Status: DC | PRN
  Administered 2020-04-15: 4 mg via INTRAMUSCULAR

## 2020-04-15 MED ORDER — ACETAMINOPHEN 325 MG PO TABS: 325.0000 mg | ORAL_TABLET | Freq: Four times a day (QID) | ORAL | Status: DC | PRN

## 2020-04-15 MED ORDER — ONDANSETRON HCL 4 MG/2ML IJ SOLN
INTRAMUSCULAR | Status: DC | PRN
  Administered 2020-04-15: 4 mg via INTRAVENOUS

## 2020-04-15 MED ORDER — CELECOXIB 200 MG PO CAPS
ORAL_CAPSULE | ORAL | Status: AC
  Administered 2020-04-15: 400 mg via ORAL
  Filled 2020-04-15: qty 2

## 2020-04-15 MED ORDER — DEXAMETHASONE SODIUM PHOSPHATE 10 MG/ML IJ SOLN
INTRAMUSCULAR | Status: AC
  Filled 2020-04-15: qty 1

## 2020-04-15 MED ORDER — PROPOFOL 10 MG/ML IV BOLUS
INTRAVENOUS | Status: AC
  Filled 2020-04-15: qty 20

## 2020-04-15 MED ORDER — EPHEDRINE 5 MG/ML INJ
INTRAVENOUS | Status: AC
  Filled 2020-04-15: qty 10

## 2020-04-15 SURGICAL SUPPLY — 30 items
ADAPTER IRRIG TUBE 2 SPIKE SOL (ADAPTER) ×4 IMPLANT
ADPR TBG 2 SPK PMP STRL ASCP (ADAPTER) ×2
BLADE SHAVER 4.5 DBL SERAT CV (CUTTER) IMPLANT
COVER WAND RF STERILE (DRAPES) ×2 IMPLANT
CUFF TOURN SGL QUICK 24 (TOURNIQUET CUFF) ×2
CUFF TOURN SGL QUICK 30 (TOURNIQUET CUFF)
CUFF TRNQT CYL 24X4X16.5-23 (TOURNIQUET CUFF) IMPLANT
CUFF TRNQT CYL 30X4X21-28X (TOURNIQUET CUFF) IMPLANT
DRAPE ARTHRO LIMB 89X125 STRL (DRAPES) ×2 IMPLANT
DRSG DERMACEA 8X12 NADH (GAUZE/BANDAGES/DRESSINGS) ×2 IMPLANT
DURAPREP 26ML APPLICATOR (WOUND CARE) ×4 IMPLANT
GAUZE SPONGE 4X4 12PLY STRL (GAUZE/BANDAGES/DRESSINGS) ×2 IMPLANT
GLOVE INDICATOR 8.0 STRL GRN (GLOVE) ×2 IMPLANT
GLOVE SURG ENC TEXT LTX SZ7.5 (GLOVE) ×2 IMPLANT
GOWN STRL REUS W/ TWL LRG LVL3 (GOWN DISPOSABLE) ×2 IMPLANT
GOWN STRL REUS W/TWL LRG LVL3 (GOWN DISPOSABLE) ×4
IV LACTATED RINGER IRRG 3000ML (IV SOLUTION) ×12
IV LR IRRIG 3000ML ARTHROMATIC (IV SOLUTION) ×6 IMPLANT
KIT TURNOVER KIT A (KITS) ×2 IMPLANT
MANIFOLD NEPTUNE II (INSTRUMENTS) ×4 IMPLANT
PACK ARTHROSCOPY KNEE (MISCELLANEOUS) ×2 IMPLANT
SET TUBE SUCT SHAVER OUTFL 24K (TUBING) ×2 IMPLANT
SET TUBE TIP INTRA-ARTICULAR (MISCELLANEOUS) ×2 IMPLANT
SOL PREP PVP 2OZ (MISCELLANEOUS) ×2
SOLUTION PREP PVP 2OZ (MISCELLANEOUS) ×1 IMPLANT
SUT ETHILON 3-0 FS-10 30 BLK (SUTURE) ×2
SUTURE EHLN 3-0 FS-10 30 BLK (SUTURE) ×1 IMPLANT
TUBING ARTHRO INFLOW-ONLY STRL (TUBING) ×2 IMPLANT
WAND HAND CNTRL MULTIVAC 50 (MISCELLANEOUS) ×2 IMPLANT
WRAP KNEE W/COLD PACKS 25.5X14 (SOFTGOODS) ×2 IMPLANT

## 2020-04-15 NOTE — Op Note (Signed)
OPERATIVE NOTE  DATE OF SURGERY:  04/15/2020  PATIENT NAME:  Jacob Perkins.   DOB: Oct 12, 1950  MRN: [id number]   PRE-OPERATIVE DIAGNOSIS:  Internal derangement of the right knee   POST-OPERATIVE DIAGNOSIS:   Tear of the posterior horn the medial meniscus, right knee  PROCEDURE:  Right knee arthroscopy, partial medial meniscectomy  SURGEON:  Marciano Sequin., M.D.   ASSISTANT: none  ANESTHESIA: general  ESTIMATED BLOOD LOSS: Minimal  FLUIDS REPLACED: 700 mL of crystalloid  TOURNIQUET TIME: Not used  INDICATIONS FOR SURGERY: Jacob Perkins. is a 70 y.o. year old male who has been seen for complaints of right knee pain. MRI demonstrated findings consistent with meniscal pathology. After discussion of the risks and benefits of surgical intervention, the patient expressed understanding of the risks benefits and agree with plans for right knee arthroscopy.   PROCEDURE IN DETAIL: The patient was brought into the operating room and, after adequate general anesthesia was achieved, a tourniquet was applied to the right thigh and the leg was placed in the leg holder. All bony prominences were well padded. The patient's right knee was cleaned and prepped with alcohol and Duraprep and draped in the usual sterile fashion. A "timeout" was performed as per usual protocol. The anticipated portal sites were injected with 0.25% Marcaine with epinephrine. An anterolateral incision was made and a cannula was inserted. A small effusion was evacuated and the knee was distended with fluid using the pump. The scope was advanced down the medial gutter into the medial compartment. Under visualization with the scope, an anteromedial portal was created and a hooked probe was inserted. The medial meniscus was visualized and probed.  There was a complex tear of the posterior horn of the medial meniscus.  A flap type lesion was noted along the undersurface.  The tear was debrided using meniscal punches  and a 4.5 mm incisor shaver.  Final contouring was performed using a 50 degree ArthroCare wand.  The remaining rim of the meniscus was visualized and probed and felt to be stable.  The articular cartilage was visualized and noted to be in good condition.  The scope was then advanced into the intercondylar notch. The anterior cruciate ligament was visualized and probed and felt to be intact. The scope was removed from the lateral portal and reinserted via the anteromedial portal to better visualize the lateral compartment. The lateral meniscus was visualized and probed.  The lateral meniscus was stable without evidence of tear or instability.  The articular cartilage of the lateral compartment was visualized and noted to be in good condition.  Finally, the scope was advanced so as to visualize the patellofemoral articulation. Good patellar tracking was appreciated.  The articular surface was in good condition.  The knee was irrigated with copius amounts of fluid and suctioned dry. The anterolateral portal was re-approximated with #3-0 nylon. A combination of 0.25% Marcaine with epinephrine and 4 mg of Morphine were injected via the scope. The scope was removed and the anteromedial portal was re-approximated with #3-0 nylon. A sterile dressing was applied followed by application of an ice wrap.  The patient tolerated the procedure well and was transported to the PACU in stable condition.  Advay Volante P. Holley Bouche., M.D.

## 2020-04-15 NOTE — H&P (Signed)
The patient has been re-examined, and the chart reviewed, and there have been no interval changes to the documented history and physical.    The risks, benefits, and alternatives have been discussed at length. The patient expressed understanding of the risks benefits and agreed with plans for surgical intervention.  Jacob Navarrete P. Jacob Perkins, Jr. M.D.    

## 2020-04-15 NOTE — Anesthesia Preprocedure Evaluation (Signed)
Anesthesia Evaluation  Patient identified by MRN, date of birth, ID band Patient awake    Reviewed: Allergy & Precautions, H&P , NPO status , Patient's Chart, lab work & pertinent test results  History of Anesthesia Complications Negative for: history of anesthetic complications  Airway Mallampati: III  TM Distance: >3 FB Neck ROM: full    Dental  (+) Missing   Pulmonary neg shortness of breath, former smoker,    Pulmonary exam normal        Cardiovascular Exercise Tolerance: Good hypertension, (-) angina(-) Past MI and (-) DOE Normal cardiovascular exam     Neuro/Psych PSYCHIATRIC DISORDERS  Neuromuscular disease negative psych ROS   GI/Hepatic Neg liver ROS, GERD  Medicated and Controlled,  Endo/Other  diabetes, Type 2, Insulin Dependent  Renal/GU      Musculoskeletal  (+) Arthritis ,   Abdominal   Peds  Hematology negative hematology ROS (+)   Anesthesia Other Findings Past Medical History: No date: Adenomatous polyp No date: Allergy No date: Anxiety No date: Arthritis No date: BPH (benign prostatic hypertrophy) No date: Diabetes mellitus No date: Diverticulosis No date: GERD (gastroesophageal reflux disease) No date: History of kidney stones No date: Hyperlipidemia No date: Hypertension No date: Rosacea No date: Spinal stenosis     Comment:  disc disease  Past Surgical History: No date: ASD REPAIR No date: BACK SURGERY 10/16/2018: COLONOSCOPY WITH PROPOFOL; N/A     Comment:  Procedure: COLONOSCOPY WITH PROPOFOL;  Surgeon: [place],               Benay Pike, MD;  Location: [facility] ENDOSCOPY;  Service:               Gastroenterology;  Laterality: N/A; 05/25/2018: HOLEP-LASER ENUCLEATION OF THE PROSTATE WITH MORCELLATION;  N/A     Comment:  Procedure: HOLEP-LASER ENUCLEATION OF THE PROSTATE WITH               MORCELLATION;  Surgeon: Billey Co, MD;  Location:               [facility] ORS;  Service:  Urology;  Laterality: N/A; 7/13: L5-S1 Diskectomy     Comment:  Dr Arnoldo Morale No date: LUMBAR FUSION     Comment:  l4-l5 No date: SPINAL CORD DECOMPRESSION  BMI    Body Mass Index: 27.46 kg/m      Reproductive/Obstetrics negative OB ROS                             Anesthesia Physical Anesthesia Plan  ASA: III  Anesthesia Plan: General LMA   Post-op Pain Management:    Induction: Intravenous  PONV Risk Score and Plan: Dexamethasone, Ondansetron, Midazolam and Treatment may vary due to age or medical condition  Airway Management Planned: LMA  Additional Equipment:   Intra-op Plan:   Post-operative Plan: Extubation in OR  Informed Consent: I have reviewed the patients History and Physical, chart, labs and discussed the procedure including the risks, benefits and alternatives for the proposed anesthesia with the patient or authorized representative who has indicated his/her understanding and acceptance.     Dental Advisory Given  Plan Discussed with: Anesthesiologist, CRNA and Surgeon  Anesthesia Plan Comments: (Patient consented for risks of anesthesia including but not limited to:  - adverse reactions to medications - damage to eyes, teeth, lips or other oral mucosa - nerve damage due to positioning  - sore throat or hoarseness - Damage to heart,  brain, nerves, lungs, other parts of body or loss of life  Patient voiced understanding.)        Anesthesia Quick Evaluation

## 2020-04-15 NOTE — Transfer of Care (Signed)
Immediate Anesthesia Transfer of Care Note  Patient: Jacob Perkins.  Procedure(s) Performed: ARTHROSCOPY KNEE (Right Knee)  Patient Location: PACU  Anesthesia Type:General  Level of Consciousness: drowsy  Airway & Oxygen Therapy: Patient Spontanous Breathing and Patient connected to face mask oxygen  Post-op Assessment: Report given to RN  Post vital signs: stable  Last Vitals:  Vitals Value Taken Time  BP    Temp    Pulse 66 04/15/20 1637  Resp 18 04/15/20 1637  SpO2 100 % 04/15/20 1637  Vitals shown include unvalidated device data.  Last Pain:  Vitals:   04/15/20 1340  TempSrc: Oral  PainSc: 0-No pain         Complications: No complications documented.

## 2020-04-15 NOTE — Anesthesia Procedure Notes (Signed)
Procedure Name: LMA Insertion Date/Time: 04/15/2020 3:33 PM Performed by: Lerry Liner, CRNA Pre-anesthesia Checklist: Patient identified, Emergency Drugs available, Suction available and Patient being monitored Patient Re-evaluated:Patient Re-evaluated prior to induction Oxygen Delivery Method: Circle system utilized Preoxygenation: Pre-oxygenation with 100% oxygen Induction Type: IV induction Ventilation: Mask ventilation without difficulty LMA: LMA inserted LMA Size: 4.0 Number of attempts: 1 Tube secured with: Tape Dental Injury: Teeth and Oropharynx as per pre-operative assessment

## 2020-04-16 ENCOUNTER — Encounter: Payer: Self-pay | Admitting: Orthopedic Surgery

## 2020-04-16 NOTE — Anesthesia Postprocedure Evaluation (Signed)
Anesthesia Post Note  Patient: Jacob Perkins.  Procedure(s) Performed: ARTHROSCOPY KNEE (Right Knee)  Patient location during evaluation: PACU Anesthesia Type: General Level of consciousness: awake and alert Pain management: pain level controlled Vital Signs Assessment: post-procedure vital signs reviewed and stable Respiratory status: spontaneous breathing, nonlabored ventilation, respiratory function stable and patient connected to nasal cannula oxygen Cardiovascular status: blood pressure returned to baseline and stable Postop Assessment: no apparent nausea or vomiting Anesthetic complications: no   No complications documented.   Last Vitals:  Vitals:   04/15/20 1714 04/15/20 1723  BP: (!) 155/75 (!) 141/68  Pulse: (!) 46   Resp: 13   Temp: 36.6 C   SpO2: 98%     Last Pain:  Vitals:   04/16/20 0836  TempSrc:   PainSc: 0-No pain                 Molli Barrows

## 2020-06-29 ENCOUNTER — Other Ambulatory Visit: Payer: Self-pay | Admitting: Urology

## 2020-06-29 DIAGNOSIS — E349 Endocrine disorder, unspecified: Secondary | ICD-10-CM

## 2020-08-18 ENCOUNTER — Ambulatory Visit: Payer: Medicare HMO | Admitting: Dermatology

## 2020-08-18 ENCOUNTER — Other Ambulatory Visit: Payer: Self-pay

## 2020-08-18 DIAGNOSIS — D692 Other nonthrombocytopenic purpura: Secondary | ICD-10-CM

## 2020-08-18 DIAGNOSIS — L821 Other seborrheic keratosis: Secondary | ICD-10-CM

## 2020-08-18 DIAGNOSIS — D235 Other benign neoplasm of skin of trunk: Secondary | ICD-10-CM

## 2020-08-18 DIAGNOSIS — L814 Other melanin hyperpigmentation: Secondary | ICD-10-CM

## 2020-08-18 DIAGNOSIS — D229 Melanocytic nevi, unspecified: Secondary | ICD-10-CM

## 2020-08-18 DIAGNOSIS — I781 Nevus, non-neoplastic: Secondary | ICD-10-CM | POA: Diagnosis not present

## 2020-08-18 DIAGNOSIS — L219 Seborrheic dermatitis, unspecified: Secondary | ICD-10-CM

## 2020-08-18 DIAGNOSIS — Z1283 Encounter for screening for malignant neoplasm of skin: Secondary | ICD-10-CM | POA: Diagnosis not present

## 2020-08-18 DIAGNOSIS — D1800 Hemangioma unspecified site: Secondary | ICD-10-CM

## 2020-08-18 DIAGNOSIS — L578 Other skin changes due to chronic exposure to nonionizing radiation: Secondary | ICD-10-CM

## 2020-08-18 MED ORDER — KETOCONAZOLE 2 % EX CREA: 1.0000 "application " | TOPICAL_CREAM | Freq: Two times a day (BID) | CUTANEOUS | 3 refills | Status: AC | PRN

## 2020-08-18 NOTE — Patient Instructions (Addendum)
Topical steroids (such as triamcinolone, fluocinolone, fluocinonide, mometasone, clobetasol, halobetasol, betamethasone, hydrocortisone) can cause thinning and lightening of the skin if they are used for too long in the same area. Your physician has selected the right strength medicine for your problem and area affected on the body. Please use your medication only as directed by your physician to prevent side effects.   Melanoma ABCDEs  Melanoma is the most dangerous type of skin cancer, and is the leading cause of death from skin disease.  You are more likely to develop melanoma if you:  Have light-colored skin, light-colored eyes, or red or blond hair  Spend a lot of time in the sun  Tan regularly, either outdoors or in a tanning bed  Have had blistering sunburns, especially during childhood  Have a close family member who has had a melanoma  Have atypical moles or large birthmarks  Early detection of melanoma is key since treatment is typically straightforward and cure rates are extremely high if we catch it early.   The first sign of melanoma is often a change in a mole or a new dark spot.  The ABCDE system is a way of remembering the signs of melanoma.  A for asymmetry:  The two halves do not match. B for border:  The edges of the growth are irregular. C for color:  A mixture of colors are present instead of an even brown color. D for diameter:  Melanomas are usually (but not always) greater than 69mm - the size of a pencil eraser. E for evolution:  The spot keeps changing in size, shape, and color.  Please check your skin once per month between visits. You can use a small mirror in front and a large mirror behind you to keep an eye on the back side or your body.   If you see any new or changing lesions before your next follow-up, please call to schedule a visit.  Please continue daily skin protection including broad spectrum sunscreen SPF 30+ to sun-exposed areas, reapplying  every 2 hours as needed when you're outdoors.   Seborrheic Keratosis  What causes seborrheic keratoses? Seborrheic keratoses are harmless, common skin growths that first appear during adult life.  As time goes by, more growths appear.  Some people may develop a large number of them.  Seborrheic keratoses appear on both covered and uncovered body parts.  They are not caused by sunlight.  The tendency to develop seborrheic keratoses can be inherited.  They vary in color from skin-colored to gray, brown, or even black.  They can be either smooth or have a rough, warty surface.   Seborrheic keratoses are superficial and look as if they were stuck on the skin.  Under the microscope this type of keratosis looks like layers upon layers of skin.  That is why at times the top layer may seem to fall off, but the rest of the growth remains and re-grows.    Treatment Seborrheic keratoses do not need to be treated, but can easily be removed in the office.  Seborrheic keratoses often cause symptoms when they rub on clothing or jewelry.  Lesions can be in the way of shaving.  If they become inflamed, they can cause itching, soreness, or burning.  Removal of a seborrheic keratosis can be accomplished by freezing, burning, or surgery. If any spot bleeds, scabs, or grows rapidly, please return to have it checked, as these can be an indication of a skin cancer.  If you have any questions or concerns for your doctor, please call our main line at [phone] and press option 4 to reach your doctor's medical assistant. If no one answers, please leave a voicemail as directed and we will return your call as soon as possible. Messages left after 4 pm will be answered the following business day.   You may also send [place] a message via [place]. We typically respond to MyChart messages within 1-2 business days.  For prescription refills, please ask your pharmacy to contact our office. Our fax number is [phone].  If  you have an urgent issue when the clinic is closed that cannot wait until the next business day, you can page your doctor at the number below.    Please note that while we do our best to be available for urgent issues outside of office hours, we are not available 24/7.   If you have an urgent issue and are unable to reach [place], you may choose to seek medical care at your doctor's office, retail clinic, urgent care center, or emergency room.  If you have a medical emergency, please immediately call 911 or go to the emergency department.  Pager Numbers  - Dr. Nehemiah Massed: [phone]  - Dr. Laurence Ferrari: [phone]  - Dr. Nicole Kindred: [phone]  In the event of inclement weather, please call our main line at [phone] for an update on the status of any delays or closures.  Dermatology Medication Tips: Please keep the boxes that topical medications come in in order to help keep track of the instructions about where and how to use these. Pharmacies typically print the medication instructions only on the boxes and not directly on the medication tubes.   If your medication is too expensive, please contact our office at [phone] option 4 or send [place] a message through [place].   We are unable to tell what your co-pay for medications will be in advance as this is different depending on your insurance coverage. However, we may be able to find a substitute medication at lower cost or fill out paperwork to get insurance to cover a needed medication.   If a prior authorization is required to get your medication covered by your insurance company, please allow [place] 1-2 business days to complete this process.  Drug prices often vary depending on where the prescription is filled and some pharmacies may offer cheaper prices.  The website www.goodrx.com contains coupons for medications through different pharmacies. The prices here do not account for what the cost may be with help from insurance (it may be cheaper  with your insurance), but the website can give you the price if you did not use any insurance.  - You can print the associated coupon and take it with your prescription to the pharmacy.  - You may also stop by our office during regular business hours and pick up a GoodRx coupon card.  - If you need your prescription sent electronically to a different pharmacy, notify our office through [facility] or by phone at [phone] option 4.  Seborrheic Dermatitis  Mix hydrocortisone with ketaconazole 2% twice a day. If improved, decrease to hydrocortisone and ketaconazole mixed once a day. If still clear, decrease to ketaconazole only.

## 2020-08-18 NOTE — Progress Notes (Signed)
Follow-Up Visit   Subjective  Jacob Perkins. is a 70 y.o. male who presents for the following: Annual Exam (Patient here today for tbse. Patient states he does not have any concerns. He has no history of skin cancer. ).  Patient here for full body skin exam and skin cancer screening.   The following portions of the chart were reviewed this encounter and updated as appropriate:       Objective  Well appearing patient in no apparent distress; mood and affect are within normal limits.  A full examination was performed including scalp, head, eyes, ears, nose, lips, neck, chest, axillae, abdomen, back, buttocks, bilateral upper extremities, bilateral lower extremities, hands, feet, fingers, toes, fingernails, and toenails. All findings within normal limits unless otherwise noted below.  Objective  right lower chest: Blanching pink macule  Objective  forehead: Erythema and scale at left ear canal  Pink scaly patch mid forehead   Objective  right upper back, left back: Dilated pores  Assessment & Plan  Telangiectasia right lower chest  Benign, observe.    Seborrheic dermatitis forehead  Chronic condition- with flare  Continue hydrocortisone 2.5 % cream as needed for flare and itch. Caution atrophy with long-term use.   Start Ketoconazole 2 % cream apply 1 application topically 2 times daily as need for rash on forehead and ear canals.  RTC if not improving  Seborrheic Dermatitis  -  is a chronic persistent rash characterized by pinkness and scaling most commonly of the mid face but also can occur on the scalp (dandruff), ears; mid chest and mid back. It tends to be exacerbated by stress and cooler weather.  People who have neurologic disease may experience new onset or exacerbation of existing seborrheic dermatitis.  The condition is not curable but treatable and can be controlled.    Topical steroids (such as triamcinolone, fluocinolone, fluocinonide,  mometasone, clobetasol, halobetasol, betamethasone, hydrocortisone) can cause thinning and lightening of the skin if they are used for too long in the same area. Your physician has selected the right strength medicine for your problem and area affected on the body. Please use your medication only as directed by your physician to prevent side effects.    ketoconazole (NIZORAL) 2 % cream - forehead  Dilated pore of Winer of back right upper back, left back  Benign, observe.      Lentigines - Scattered tan macules - Due to sun exposure - Benign-appering, observe - Recommend daily broad spectrum sunscreen SPF 30+ to sun-exposed areas, reapply every 2 hours as needed. - Call for any changes  Sebaceous Hyperplasia - Small yellow papules with a central dell - Benign - Observe  Purpura - Chronic; persistent and recurrent.  Treatable, but not curable. - Violaceous macules and patches at arms  - Benign - Related to trauma, age, sun damage and/or use of blood thinners, chronic use of topical and/or oral steroids - Observe - Can use OTC arnica containing moisturizer such as Dermend Bruise Formula if desired - Call for worsening or other concerns  Seborrheic Keratoses - Stuck-on, waxy, tan-brown papules and/or plaques left mid back   - Benign-appearing - Discussed benign etiology and prognosis. - Observe - Call for any changes  Melanocytic Nevi - Tan-brown and/or pink-flesh-colored symmetric macules and papules - Benign appearing on exam today - Observation - Call clinic for new or changing moles - Recommend daily use of broad spectrum spf 30+ sunscreen to sun-exposed areas.   Hemangiomas - Red papules -  Discussed benign nature - Observe - Call for any changes  Actinic Damage - Chronic condition, secondary to cumulative UV/sun exposure - diffuse scaly erythematous macules with underlying dyspigmentation - Recommend daily broad spectrum sunscreen SPF 30+ to sun-exposed areas,  reapply every 2 hours as needed.  - Staying in the shade or wearing long sleeves, sun glasses (UVA+UVB protection) and wide brim hats (4-inch brim around the entire circumference of the hat) are also recommended for sun protection.  - Call for new or changing lesions.  Skin cancer screening performed today.  Return in about 1 year (around 08/18/2021) for tbse.  I, Ruthell Rummage, CMA, am acting as scribe for Brendolyn Patty, MD.  Documentation: I have reviewed the above documentation for accuracy and completeness, and I agree with the above.  Brendolyn Patty MD

## 2020-10-29 ENCOUNTER — Encounter: Payer: Self-pay | Admitting: *Deleted

## 2020-10-29 ENCOUNTER — Encounter: Payer: Medicare HMO | Attending: Internal Medicine | Admitting: *Deleted

## 2020-10-29 ENCOUNTER — Other Ambulatory Visit: Payer: Self-pay

## 2020-10-29 VITALS — BP 148/80 | Ht 64.0 in | Wt 171.6 lb

## 2020-10-29 DIAGNOSIS — E119 Type 2 diabetes mellitus without complications: Secondary | ICD-10-CM

## 2020-10-29 DIAGNOSIS — Z794 Long term (current) use of insulin: Secondary | ICD-10-CM

## 2020-10-29 NOTE — Patient Instructions (Addendum)
Check blood sugars 2 x day before breakfast and 2 hrs after supper every day Bring blood sugar records to the next appointment  Exercise:  Continue bike for    30  minutes  1  day a week and gradually increase to 30 minutes 5 x week  Eat 3 meals day,   2-3  snacks a day Space meals 4-6 hours apart Allow 2-3 hours between meals and snacks Include at least 1 carbohydrate serving with each meal Complete 3 Day Food Record and bring to next appt  Carry fast acting glucose and a snack at all times Rotate injection sites  Return for appointment on:  Wednesday  Sept 21, 2022 at 4:00 pm with [facility] (dietitian)

## 2020-10-30 NOTE — Progress Notes (Signed)
Diabetes Self-Management Education  Visit Type: First/Initial  Appt. Start Time: 1515 Appt. End Time: 1630  10/29/2020  [name], identified by name and date of birth, is a 70 y.o. male with a diagnosis of Diabetes: Type 2.   ASSESSMENT  Blood pressure (!) 148/80, height '5\' 4"'$  (1.626 m), weight 171 lb 9.6 oz (77.8 kg). Body mass index is 29.46 kg/m.   Diabetes Self-Management Education - 10/29/20 1702       Visit Information   Visit Type First/Initial      Initial Visit   Diabetes Type Type 2    Are you currently following a meal plan? Yes    What type of meal plan do you follow? "watch carbs and sugars, I journal what I eat"    Are you taking your medications as prescribed? Yes    Date Diagnosed 30+ years      Health Coping   How would you rate your overall health? Good      Psychosocial Assessment   Patient Belief/Attitude about Diabetes Motivated to manage diabetes   "occasional distress over it"   Self-care barriers None    Self-management support Doctor's office;Family;Internet communities    Other persons present Spouse/SO    Patient Concerns Nutrition/Meal planning;Glycemic Control;Other (comment)   "want nutrition ideas to help control blood sugars"   Special Needs None    Preferred Learning Style Hands on    Learning Readiness Change in progress    How often do you need to have someone help you when you read instructions, pamphlets, or other written materials from your doctor or pharmacy? 1 - Never      Pre-Education Assessment   Patient understands the diabetes disease and treatment process. Demonstrates understanding / competency    Patient understands incorporating nutritional management into lifestyle. Needs Review    Patient undertands incorporating physical activity into lifestyle. Needs Review    Patient understands using medications safely. Demonstrates understanding / competency    Patient understands monitoring blood glucose, interpreting  and using results Demonstrates understanding / competency    Patient understands prevention, detection, and treatment of acute complications. Demonstrates understanding / competency    Patient understands prevention, detection, and treatment of chronic complications. Demonstrates understanding / competency    Patient understands how to develop strategies to address psychosocial issues. Demonstrates understanding / competency    Patient understands how to develop strategies to promote health/change behavior. Demonstrates understanding / competency      Complications   Last HgB A1C per patient/outside source 7.1 %   09/25/2020   How often do you check your blood sugar? 1-2 times/day   7 day average 129 mg/dL; he has a [employer] 2 and will attempt to wear again - 1st one fell off   Fasting Blood glucose range (mg/dL) 70-129   FBG's 78-123 mg/dL   Postprandial Blood glucose range (mg/dL) --   Bedtime readings 124-171 mg/dL with 1 reading of 224 mg/dL   Have you had a dilated eye exam in the past 12 months? Yes    Have you had a dental exam in the past 12 months? Yes    Are you checking your feet? Yes    How many days per week are you checking your feet? 7      Dietary Intake   Breakfast egg, cheese and bacon bits with fruit (peach, cantaloup, strawberries, halo - whatever is in season); cheese toast; egg with sausage, bacon, ham    Snack (morning) fruit  and cheese    Lunch ham and cheese wrap; tuna and cottage cheese; tomatoes with cuccumbers and vinegar    Snack (afternoon) fruit    Dinner chicken, beef, pork, fish; occasional beans, green beans, broccoli, cauliflower, squash, zucchini, cuccumbers, tomatoes    Snack (evening) cheese, rice cakes    Beverage(s) water, coffee      Exercise   Exercise Type Moderate (swimming / aerobic walking)   recumbent bike   How many days per week to you exercise? 1    How many minutes per day do you exercise? 30    Total minutes per week of exercise  30      Patient Education   Previous Diabetes Education Yes (please comment)   years ago   Disease state  Explored patient's options for treatment of their diabetes    Nutrition management  Food label reading, portion sizes and measuring food.;Carbohydrate counting;Reviewed blood glucose goals for pre and post meals and how to evaluate the patients' food intake on their blood glucose level.;Meal timing in regards to the patients' current diabetes medication.    Physical activity and exercise  Role of exercise on diabetes management, blood pressure control and cardiac health.    Medications Taught/reviewed insulin injection, site rotation, insulin storage and needle disposal.;Reviewed patients medication for diabetes, action, purpose, timing of dose and side effects.    Monitoring Taught/discussed recording of test results and interpretation of SMBG.;Identified appropriate SMBG and/or A1C goals.;Other (comment)   use of FreeStyle Libre   Acute complications Taught treatment of hypoglycemia - the 15 rule.    Chronic complications Relationship between chronic complications and blood glucose control    Psychosocial adjustment Identified and addressed patients feelings and concerns about diabetes      Individualized Goals (developed by patient)   Reducing Risk Other (comment)   "nutrition ideas to help control blood sugars"     Outcomes   Expected Outcomes Demonstrated interest in learning. Expect positive outcomes    Future DMSE 4-6 wks             Individualized Plan for Diabetes Self-Management Training:   Learning Objective:  Patient will have a greater understanding of diabetes self-management. Patient education plan is to attend individual and/or group sessions per assessed needs and concerns.   Plan:   Patient Instructions  Check blood sugars 2 x day before breakfast and 2 hrs after supper every day Bring blood sugar records to the next appointment  Exercise:  Continue bike for     30  minutes  1  day a week and gradually increase to 30 minutes 5 x week  Eat 3 meals day,   2-3  snacks a day Space meals 4-6 hours apart Allow 2-3 hours between meals and snacks Include at least 1 carbohydrate serving with each meal Complete 3 Day Food Record and bring to next appt  Carry fast acting glucose and a snack at all times Rotate injection sites  Return for appointment on:  Wednesday  Sept 21, 2022 at 4:00 pm with [facility] (dietitian)  Expected Outcomes:  Demonstrated interest in learning. Expect positive outcomes  Education material provided:  General Meal Planning Guidelines Simple Meal Plan 3 Day Food Record Symptoms, causes and treatments of Hypoglycemia   If problems or questions, patient to contact team via:   Johny Drilling, RN, [place] [phone]  Future DSME appointment: 4-6 wks December 16, 2020 with the dietitian

## 2020-11-02 ENCOUNTER — Other Ambulatory Visit: Payer: Self-pay | Admitting: Urology

## 2020-11-02 DIAGNOSIS — E349 Endocrine disorder, unspecified: Secondary | ICD-10-CM

## 2020-11-06 ENCOUNTER — Telehealth: Payer: Self-pay | Admitting: *Deleted

## 2020-11-06 NOTE — Telephone Encounter (Signed)
Received voice mail from pt and wife regarding FreeStyle Libre. Called them back and they questioned how often he should scan his sensor. He just started it yesterday. Discussed he could do any time but may want to start with fasting and 2 hrs after 1 meal every day. Discussed readings of 130 mg/dL before meals and 180 mg/dL 2 hrs after meals. Also to scan for symptoms of high and low blood sugars. He had a reading of 247 mg/dL last night and wasn't sure the reason. Instructed him to use his glucometer if he questions the reading. They report that it is a new device and he "doesn't trust it yet."  Wife reports that he was scanning "too much." Instructed them to call back for any other questions.

## 2020-11-10 ENCOUNTER — Other Ambulatory Visit: Payer: Self-pay | Admitting: Otolaryngology

## 2020-11-10 DIAGNOSIS — R220 Localized swelling, mass and lump, head: Secondary | ICD-10-CM

## 2020-11-25 ENCOUNTER — Ambulatory Visit: Payer: Medicare HMO

## 2020-11-26 ENCOUNTER — Ambulatory Visit: Payer: Self-pay | Admitting: Urology

## 2020-12-03 ENCOUNTER — Ambulatory Visit: Admission: RE | Admit: 2020-12-03 | Payer: Medicare HMO | Source: Ambulatory Visit

## 2020-12-07 NOTE — Progress Notes (Deleted)
12/08/2020 11:10 AM   Jacob Perkins. October 13, 1950 [id number]  Referring provider: Baxter Hire, MD [place],  [place] [address]  Urological history: 1. Testosterone deficiency -contributing factors of age, diabetes and obesity -testosterone pending -H & H pending  -LFT's WNL in 09/2020 -managed with Clomid 50 mg, 1 tablet daily   2. BPH with LU TS -PSA 0.79 in 09/2020 -I PSS *** -PVR *** -HoLEP 2020 - pathology benign  3. ED -contributing factors of age, BPH, testosterone deficiency, spinal stenosis, DM, HTN, HLD and anxiety  No chief complaint on file.   HPI: Jacob Perkins. Is a 70 y.o. male who presents today for a one year follow up.        Score: 1-7 Severe ED 8-11 Moderate ED 12-16 Mild-Moderate ED 17-21 Mild ED 22-25 No ED        Score:  1-7 Mild 8-19 Moderate 20-35 Severe  PMH: Past Medical History:  Diagnosis Date   Adenomatous polyp    Allergy    Anxiety    Arthritis    BPH (benign prostatic hypertrophy)    Diabetes mellitus    Diverticulosis    GERD (gastroesophageal reflux disease)    History of kidney stones    Hyperlipidemia    Hypertension    Rosacea    Spinal stenosis    disc disease    Surgical History: Past Surgical History:  Procedure Laterality Date   ASD REPAIR     BACK SURGERY     COLONOSCOPY WITH PROPOFOL N/A 10/16/2018   Procedure: COLONOSCOPY WITH PROPOFOL;  Surgeon: Toledo, Benay Pike, MD;  Location: [facility]COPY;  Service: Gastroenterology;  Laterality: N/A;   HOLEP-LASER ENUCLEATION OF THE PROSTATE WITH MORCELLATION N/A 05/25/2018   Procedure: HOLEP-LASER ENUCLEATION OF THE PROSTATE WITH MORCELLATION;  Surgeon: Billey Co, MD;  Location: [facility] ORS;  Service: Urology;  Laterality: N/A;   KNEE ARTHROSCOPY Right 04/15/2020   Procedure: ARTHROSCOPY KNEE;  Surgeon: Dereck Leep, MD;  Location: [facility] ORS;  Service: Orthopedics;  Laterality: Right;   L5-S1 Diskectomy   7/13   Dr Arnoldo Morale   LUMBAR FUSION     l4-l5   SPINAL CORD DECOMPRESSION      Home Medications:  Allergies as of 12/08/2020   No Known Allergies      Medication List        Accurate as of December 07, 2020 11:10 AM. If you have any questions, ask your nurse or doctor.          Adult One Daily Gummies Chew Chew 2 capsules by mouth at bedtime.   aspirin 81 MG tablet Take 81 mg by mouth at bedtime.   B-D ULTRAFINE III SHORT PEN 31G X 8 MM Misc Generic drug: Insulin Pen Needle USE AS DIRECTED   clomiPHENE 50 MG tablet Commonly known as: CLOMID TAKE 1 TABLET BY MOUTH ONCE A DAY   esomeprazole 40 MG capsule Commonly known as: NEXIUM Take 40 mg by mouth every other day.   fish oil-omega-3 fatty acids 1000 MG capsule Take 1 g by mouth 2 (two) times daily.   fluticasone 50 MCG/ACT nasal spray Commonly known as: FLONASE USE 2 SPRAYS IN EACH NOSTRIL            EVERY DAY What changed:  when to take this reasons to take this   gabapentin 300 MG capsule Commonly known as: NEURONTIN Take 300 mg by mouth 2 (two) times daily.  GLUCOSAMINE-CHONDROITIN PO Take 1 tablet by mouth 2 (two) times daily.   HYDROcodone-acetaminophen 5-325 MG tablet Commonly known as: Norco Take 1-2 tablets by mouth every 4 (four) hours as needed for moderate pain.   hydrocortisone cream 1 % Apply 1 application topically daily as needed for itching.   ibuprofen 200 MG tablet Commonly known as: ADVIL Take 400-600 mg by mouth every 6 (six) hours as needed for headache or moderate pain.   ketoconazole 2 % cream Commonly known as: NIZORAL Apply 1 application topically daily.   ketotifen 0.025 % ophthalmic solution Commonly known as: ZADITOR Place 1 drop into both eyes 2 (two) times daily as needed (allergies).   Levemir FlexTouch 100 UNIT/ML FlexPen Generic drug: insulin detemir Inject 40 Units into the skin at bedtime.   Melatonin 5 MG Caps Take 5-10 mg by mouth at bedtime as  needed (sleep).   metFORMIN 1000 MG tablet Commonly known as: GLUCOPHAGE TAKE ONE TABLET TWICE A DAY What changed: when to take this   Naproxen Sod-diphenhydrAMINE 220-25 MG Tabs Take 1 tablet by mouth at bedtime.   ONE TOUCH ULTRA TEST test strip Generic drug: glucose blood USE AS DIRECTED   ramipril 10 MG capsule Commonly known as: ALTACE Take 10 mg by mouth daily.   simvastatin 40 MG tablet Commonly known as: ZOCOR TAKE 1 TABLET EVERY DAY What changed: when to take this   Trulicity 3 LS/9.3TD Sopn Generic drug: Dulaglutide Inject 3 mg into the skin once a week.   vitamin C 1000 MG tablet Take 500 mg by mouth daily.        Allergies: No Known Allergies  Family History: Family History  Problem Relation Age of Onset   Diabetes Father    Urolithiasis Father    Alcohol abuse Paternal Aunt    Kidney disease Cousin    Kidney cancer Neg Hx    Prostate cancer Neg Hx    Bladder Cancer Neg Hx     Social History:  reports that he quit smoking about 13 years ago. His smoking use included cigars. He has never used smokeless tobacco. He reports current alcohol use. He reports that he does not use drugs.  ROS: For pertinent review of systems please refer to history of present illness  Physical Exam: There were no vitals taken for this visit.  Constitutional:  Well nourished. Alert and oriented, No acute distress. HEENT: [place] AT, moist mucus membranes.  Trachea midline Cardiovascular: No clubbing, cyanosis, or edema. Respiratory: Normal respiratory effort, no increased work of breathing. GI: Abdomen is soft, non tender, non distended, no abdominal masses. Liver and spleen not palpable.  No hernias appreciated.  Stool sample for occult testing is not indicated.   GU: No CVA tenderness.  No bladder fullness or masses.  Patient with circumcised/uncircumcised phallus. ***Foreskin easily retracted***  Urethral meatus is patent.  No penile discharge. No penile lesions or rashes.  Scrotum without lesions, cysts, rashes and/or edema.  Testicles are located scrotally bilaterally. No masses are appreciated in the testicles. Left and right epididymis are normal. Rectal: Patient with  normal sphincter tone. Anus and perineum without scarring or rashes. No rectal masses are appreciated. Prostate is approximately *** grams, *** nodules are appreciated. Seminal vesicles are normal. Skin: No rashes, bruises or suspicious lesions. Lymph: No inguinal adenopathy. Neurologic: Grossly intact, no focal deficits, moving all 4 extremities. Psychiatric: Normal mood and affect.   Laboratory Data: Hemoglobin A1C 4.2 - 5.6 % 7.1 High    Average Blood  Glucose (Calc) mg/dL 157   Resulting Agency  [facility]  Narrative Performed by [facility] - LAB Normal Range:    4.2 - 5.6%  Increased Risk:  5.7 - 6.4%  Diabetes:        >= 6.5%  Glycemic Control for adults with diabetes:  <7%   Specimen Collected: 09/25/20 08:55 Last Resulted: 09/25/20 09:08  Received From: [facility]  Result Received: 10/15/20 09:52   Glucose 70 - 110 mg/dL 89   Sodium 136 - 145 mmol/L 142   Potassium 3.6 - 5.1 mmol/L 4.5   Chloride 97 - 109 mmol/L 105   Carbon Dioxide (CO2) 22.0 - 32.0 mmol/L 30.0   Urea Nitrogen (BUN) 7 - 25 mg/dL 14   Creatinine 0.7 - 1.3 mg/dL 0.7   Glomerular Filtration Rate (eGFR), MDRD Estimate >60 mL/min/1.73sq m 112   Calcium 8.7 - 10.3 mg/dL 9.0   AST  8 - 39 U/L 28   ALT  6 - 57 U/L 32   Alk Phos (alkaline Phosphatase) 34 - 104 U/L 26 Low    Albumin 3.5 - 4.8 g/dL 4.2   Bilirubin, Total 0.3 - 1.2 mg/dL 0.6   Protein, Total 6.1 - 7.9 g/dL 5.9 Low    A/G Ratio 1.0 - 5.0 gm/dL 2.5   Resulting Agency  [facility] - LAB  Specimen Collected: 09/25/20 08:55 Last Resulted: 09/25/20 11:18  Received From: [facility]  Result Received: 10/15/20 09:52   WBC (White Blood Cell Count) 4.1 - 10.2 10^3/uL 8.2   RBC (Red  Blood Cell Count) 4.69 - 6.13 10^6/uL 4.47 Low    Hemoglobin 14.1 - 18.1 gm/dL 13.9 Low    Hematocrit 40.0 - 52.0 % 40.7   MCV (Mean Corpuscular Volume) 80.0 - 100.0 fl 91.1   MCH (Mean Corpuscular Hemoglobin) 27.0 - 31.2 pg 31.1   MCHC (Mean Corpuscular Hemoglobin Concentration) 32.0 - 36.0 gm/dL 34.2   Platelet Count 150 - 450 10^3/uL 192   RDW-CV (Red Cell Distribution Width) 11.6 - 14.8 % 13.2   MPV (Mean Platelet Volume) 9.4 - 12.4 fl 9.9   Neutrophils 1.50 - 7.80 10^3/uL 4.19   Lymphocytes 1.00 - 3.60 10^3/uL 2.65   Monocytes 0.00 - 1.50 10^3/uL 0.99   Eosinophils 0.00 - 0.55 10^3/uL 0.28   Basophils 0.00 - 0.09 10^3/uL 0.05   Neutrophil % 32.0 - 70.0 % 51.3   Lymphocyte % 10.0 - 50.0 % 32.4   Monocyte % 4.0 - 13.0 % 12.1   Eosinophil % 1.0 - 5.0 % 3.4   Basophil% 0.0 - 2.0 % 0.6   Immature Granulocyte % <=0.7 % 0.2   Immature Granulocyte Count <=0.06 10^3/L 0.02   Resulting Agency  [facility] - LAB  Specimen Collected: 09/25/20 08:55 Last Resulted: 09/25/20 09:05  Received From: [facility]  Result Received: 10/15/20 09:52   Cholesterol, Total 100 - 200 mg/dL 152   Triglyceride 35 - 199 mg/dL 93   HDL (High Density Lipoprotein) Cholesterol 29.0 - 71.0 mg/dL 39.1   LDL Calculated 0 - 130 mg/dL 94   VLDL Cholesterol mg/dL 19   Cholesterol/HDL Ratio  3.9   Resulting Agency  [facility] - LAB  Specimen Collected: 09/25/20 08:55 Last Resulted: 09/25/20 11:18  Received From: [facility]  Result Received: 10/15/20 09:52   Color Yellow, Violet, Light Violet, Dark Violet Yellow   Clarity Clear Clear   Specific  Gravity 1.000 - 1.030 1.020   pH, Urine 5.0 - 8.0 6.0   Protein, Urinalysis Negative, Trace mg/dL Negative   Glucose, Urinalysis Negative mg/dL Negative   Ketones, Urinalysis Negative mg/dL Trace Abnormal    Blood, Urinalysis Negative Negative   Nitrite, Urinalysis Negative Negative   Leukocyte Esterase,  Urinalysis Negative Negative   White Blood Cells, Urinalysis None Seen, 0-3 /hpf None Seen   Red Blood Cells, Urinalysis None Seen, 0-3 /hpf None Seen   Bacteria, Urinalysis None Seen /hpf None Seen   Squamous Epithelial Cells, Urinalysis Rare, Few, None Seen /hpf Rare   Resulting Agency  [facility] - LAB  Specimen Collected: 09/25/20 08:55 Last Resulted: 09/25/20 09:19  Received From: [facility]  Result Received: 10/15/20 09:52   PSA (Prostate Specific Antigen), Total 0.10 - 4.00 ng/mL 0.79   Resulting Agency  [facility] - LAB  Narrative Performed by [facility] - LAB Test results were determined with Beckman Coulter Hybritech Assay. Values obtained with different assay methods cannot be used interchangeably in serial testing. Assay results should not be interpreted as absolute evidence of the presence or absence of malignant disease Specimen Collected: 09/25/20 08:55 Last Resulted: 09/25/20 11:18  Received From: [facility]  Result Received: 10/15/20 09:52  I have reviewed the labs.  Pertinent Imaging ***   Assessment & Plan:    1. Testosterone deficiency -continue Clomid 50 mg, 1 tablet daily  -RTC in 12 months for HCT, testosterone, PSA, LFT's and exam  2. BPH with LUTS S/P HoLEP 04/2018 - very pleased with procedure  I PSS 6/2, it is worse RTC in 12 months for I PSS and exam  3. Erectile dysfunction:   -SHIM score 13, it is worsening - Not sexually active at this time  -RTC in 6 months for SHIM score and exam, as testosterone therapy can affect erections  No follow-ups on file.  These notes generated with voice recognition software. I apologize for typographical errors.  Zara Council, PA-C  [facility] [address] [place], [place] [address] [phone]

## 2020-12-08 ENCOUNTER — Ambulatory Visit: Payer: Self-pay | Admitting: Urology

## 2020-12-16 ENCOUNTER — Ambulatory Visit: Payer: Medicare HMO | Admitting: Dietician

## 2020-12-16 NOTE — Progress Notes (Signed)
12/17/20 3:33 PM   Jacob Pat Jr. [birth year] [id number]  Referring provider:  Baxter Hire, MD [place],  [place] [address]  Chief Complaint  Patient presents with   Hypogonadism   Urological history: 1. Testosterone deficiency  -testosterone pending -managed with Clomid 50 mg daily   2. Erectile dysfunction  - SHIM 6 -  risk factors for ED are age, BPH, testosterone deficiency, spinal stenosis, DM, HTN, HLD and anxiety  3. BPH with LUTS  -PSA 0.79 in 09/2020 - IPSS 10/1 - S/p HoLEP on 05/25/2018    HPI: Jacob Perkins. is a 70 y.o.male who presents today for one year follow up.   He has no urinary complaints today.   Patient denies any modifying or aggravating factors.  Patient denies any gross hematuria, dysuria or suprapubic/flank pain.  Patient denies any fevers, chills, nausea or vomiting.     IPSS     Row Name 12/17/20 1500         International Prostate Symptom Score   How often have you had the sensation of not emptying your bladder? Less than half the time     How often have you had to urinate less than every two hours? Less than half the time     How often have you found you stopped and started again several times when you urinated? Less than half the time     How often have you found it difficult to postpone urination? Less than 1 in 5 times     How often have you had a weak urinary stream? Less than 1 in 5 times     How often have you had to strain to start urination? Less than 1 in 5 times     How many times did you typically get up at night to urinate? 1 Time     Total IPSS Score 10           Quality of Life due to urinary symptoms   If you were to spend the rest of your life with your urinary condition just the way it is now how would you feel about that? Pleased              Score:  1-7 Mild 8-19 Moderate 20-35 Severe  He does not have erections firm enough for intercourse but they are firm enough for  masturbation.    SHIM     Row Name 12/17/20 1504         SHIM: Over the last 6 months:   How do you rate your confidence that you could get and keep an erection? Low     When you had erections with sexual stimulation, how often were your erections hard enough for penetration (entering your partner)? Almost Never or Never     During sexual intercourse, how often were you able to maintain your erection after you had penetrated (entered) your partner? Almost Never or Never     During sexual intercourse, how difficult was it to maintain your erection to completion of intercourse? Extremely Difficult     When you attempted sexual intercourse, how often was it satisfactory for you? Almost Never or Never           SHIM Total Score   SHIM 6                 PMH: Past Medical History:  Diagnosis Date   Adenomatous polyp    Allergy  Anxiety    Arthritis    BPH (benign prostatic hypertrophy)    Diabetes mellitus    Diverticulosis    GERD (gastroesophageal reflux disease)    History of kidney stones    Hyperlipidemia    Hypertension    Rosacea    Spinal stenosis    disc disease    Surgical History: Past Surgical History:  Procedure Laterality Date   ASD REPAIR     BACK SURGERY     COLONOSCOPY WITH PROPOFOL N/A 10/16/2018   Procedure: COLONOSCOPY WITH PROPOFOL;  Surgeon: Toledo, Benay Pike, MD;  Location: [facility];  Service: Gastroenterology;  Laterality: N/A;   HOLEP-LASER ENUCLEATION OF THE PROSTATE WITH MORCELLATION N/A 05/25/2018   Procedure: HOLEP-LASER ENUCLEATION OF THE PROSTATE WITH MORCELLATION;  Surgeon: Billey Co, MD;  Location: [facility] ORS;  Service: Urology;  Laterality: N/A;   KNEE ARTHROSCOPY Right 04/15/2020   Procedure: ARTHROSCOPY KNEE;  Surgeon: Dereck Leep, MD;  Location: [facility] ORS;  Service: Orthopedics;  Laterality: Right;   L5-S1 Diskectomy  7/13   Dr Arnoldo Morale   LUMBAR FUSION     l4-l5   SPINAL CORD DECOMPRESSION      Home  Medications:  Allergies as of 12/17/2020   No Known Allergies      Medication List        Accurate as of December 17, 2020  3:33 PM. If you have any questions, ask your nurse or doctor.          Adult One Daily Gummies Chew Chew 2 capsules by mouth at bedtime.   aspirin 81 MG tablet Take 81 mg by mouth at bedtime.   B-D ULTRAFINE III SHORT PEN 31G X 8 MM Misc Generic drug: Insulin Pen Needle USE AS DIRECTED   clomiPHENE 50 MG tablet Commonly known as: CLOMID TAKE 1 TABLET BY MOUTH ONCE A DAY   esomeprazole 40 MG capsule Commonly known as: NEXIUM Take 40 mg by mouth every other day.   fish oil-omega-3 fatty acids 1000 MG capsule Take 1 g by mouth 2 (two) times daily.   fluticasone 50 MCG/ACT nasal spray Commonly known as: FLONASE USE 2 SPRAYS IN EACH NOSTRIL            EVERY DAY What changed:  when to take this reasons to take this   gabapentin 300 MG capsule Commonly known as: NEURONTIN Take 300 mg by mouth 2 (two) times daily.   GLUCOSAMINE-CHONDROITIN PO Take 1 tablet by mouth 2 (two) times daily.   HYDROcodone-acetaminophen 5-325 MG tablet Commonly known as: Norco Take 1-2 tablets by mouth every 4 (four) hours as needed for moderate pain.   hydrocortisone cream 1 % Apply 1 application topically daily as needed for itching.   ibuprofen 200 MG tablet Commonly known as: ADVIL Take 400-600 mg by mouth every 6 (six) hours as needed for headache or moderate pain.   ketoconazole 2 % cream Commonly known as: NIZORAL Apply 1 application topically daily.   ketotifen 0.025 % ophthalmic solution Commonly known as: ZADITOR Place 1 drop into both eyes 2 (two) times daily as needed (allergies).   Levemir FlexTouch 100 UNIT/ML FlexPen Generic drug: insulin detemir Inject 40 Units into the skin at bedtime.   Melatonin 5 MG Caps Take 5-10 mg by mouth at bedtime as needed (sleep).   metFORMIN 1000 MG tablet Commonly known as: GLUCOPHAGE TAKE ONE TABLET  TWICE A DAY What changed: when to take this   Naproxen Sod-diphenhydrAMINE 220-25 MG Tabs Take  1 tablet by mouth at bedtime.   ONE TOUCH ULTRA TEST test strip Generic drug: glucose blood USE AS DIRECTED   ramipril 10 MG capsule Commonly known as: ALTACE Take 10 mg by mouth daily.   simvastatin 40 MG tablet Commonly known as: ZOCOR TAKE 1 TABLET EVERY DAY What changed: when to take this   Trulicity 3 QA/8.3MH Sopn Generic drug: Dulaglutide Inject 3 mg into the skin once a week.   vitamin C 1000 MG tablet Take 500 mg by mouth daily.        Allergies:  No Known Allergies  Family History: Family History  Problem Relation Age of Onset   Diabetes Father    Urolithiasis Father    Alcohol abuse Paternal Aunt    Kidney disease Cousin    Kidney cancer Neg Hx    Prostate cancer Neg Hx    Bladder Cancer Neg Hx     Social History:  reports that he quit smoking about 13 years ago. His smoking use included cigars. He has never used smokeless tobacco. He reports current alcohol use. He reports that he does not use drugs.   Physical Exam: BP (!) 167/86   Pulse (!) 55   Ht 5' 4"  (1.626 m)   Wt 171 lb (77.6 kg)   BMI 29.35 kg/m   Constitutional:  Alert and oriented, No acute distress. HEENT: [place] AT, mask in place.  Trachea midline Cardiovascular: No clubbing, cyanosis, or edema. Respiratory: Normal respiratory effort, no increased work of breathing. GU: No CVA tenderness, circumcised phallus   Rectal: Normal sphincter tone,  50  CC prostate, HoLEP defect, could not feel seminal vesicles, no nodules Neurologic: Grossly intact, no focal deficits, moving all 4 extremities. Psychiatric: Normal mood and affect.  Laboratory Data: Lab Results  Component Value Date   CREATININE 0.65 05/18/2018    Ref Range & Units 2 mo ago  Glucose 70 - 110 mg/dL 89   Sodium 136 - 145 mmol/L 142   Potassium 3.6 - 5.1 mmol/L 4.5   Chloride 97 - 109 mmol/L 105   Carbon Dioxide (CO2) 22.0 -  32.0 mmol/L 30.0   Urea Nitrogen (BUN) 7 - 25 mg/dL 14   Creatinine 0.7 - 1.3 mg/dL 0.7   Glomerular Filtration Rate (eGFR), MDRD Estimate >60 mL/min/1.73sq m 112   Calcium 8.7 - 10.3 mg/dL 9.0   AST  8 - 39 U/L 28   ALT  6 - 57 U/L 32   Alk Phos (alkaline Phosphatase) 34 - 104 U/L 26 Low    Albumin 3.5 - 4.8 g/dL 4.2   Bilirubin, Total 0.3 - 1.2 mg/dL 0.6   Protein, Total 6.1 - 7.9 g/dL 5.9 Low    A/G Ratio 1.0 - 5.0 gm/dL 2.5   Resulting Agency  [facility] - LAB  Specimen Collected: 09/25/20 08:55 Last Resulted: 09/25/20 11:18  Received From: [facility]  Result Received: 10/15/20 09:52    Ref Range & Units 2 mo ago  Cholesterol, Total 100 - 200 mg/dL 152   Triglyceride 35 - 199 mg/dL 93   HDL (High Density Lipoprotein) Cholesterol 29.0 - 71.0 mg/dL 39.1   LDL Calculated 0 - 130 mg/dL 94   VLDL Cholesterol mg/dL 19   Cholesterol/HDL Ratio  3.9   Resulting Agency  [facility] - LAB  Specimen Collected: 09/25/20 08:55 Last Resulted: 09/25/20 11:18  Received From: [facility]  Result Received: 10/15/20 09:52   PSA (Prostate Specific Antigen),  Total 0.10 - 4.00 ng/mL 0.79   Resulting Agency  [facility] - LAB  Narrative    Urinalysis  Ref Range & Units 2 mo ago  Color Yellow, Violet, Light Violet, Dark Violet Yellow   Clarity Clear Clear   Specific Gravity 1.000 - 1.030 1.020   pH, Urine 5.0 - 8.0 6.0   Protein, Urinalysis Negative, Trace mg/dL Negative   Glucose, Urinalysis Negative mg/dL Negative   Ketones, Urinalysis Negative mg/dL Trace Abnormal    Blood, Urinalysis Negative Negative   Nitrite, Urinalysis Negative Negative   Leukocyte Esterase, Urinalysis Negative Negative   White Blood Cells, Urinalysis None Seen, 0-3 /hpf None Seen   Red Blood Cells, Urinalysis None Seen, 0-3 /hpf None Seen   Bacteria, Urinalysis None Seen /hpf None Seen   Squamous Epithelial Cells, Urinalysis Rare, Few, None Seen /hpf  Rare   Resulting Agency  [facility] - LAB  Specimen Collected: 09/25/20 08:55 Last Resulted: 09/25/20 09:19  Received From: [facility]  Result Received: 10/15/20 09:52    Pertinent Imaging: No results found for any visits on 12/17/20.   Assessment & Plan:    Testosterone deficiency  - Testosterone pending  - continue Clomid 50 mg daily   2. BPH with LUTS - s/p HoLEP 04/2018  -PSA stable -DRE benign -UA benign -continue conservative management, avoiding bladder irritants and timed voiding's   3. ED - Discussed prescribing PDE5i but he declined   No follow-ups on file.  [facility] [address] [place], [place] [address] [phone]  Zara Council, PA-C

## 2020-12-17 ENCOUNTER — Other Ambulatory Visit: Payer: Self-pay

## 2020-12-17 ENCOUNTER — Encounter: Payer: Self-pay | Admitting: Urology

## 2020-12-17 ENCOUNTER — Ambulatory Visit: Payer: Medicare HMO | Admitting: Urology

## 2020-12-17 VITALS — BP 167/86 | HR 55 | Ht 64.0 in | Wt 171.0 lb

## 2020-12-17 DIAGNOSIS — N529 Male erectile dysfunction, unspecified: Secondary | ICD-10-CM | POA: Diagnosis not present

## 2020-12-17 DIAGNOSIS — E349 Endocrine disorder, unspecified: Secondary | ICD-10-CM | POA: Diagnosis not present

## 2020-12-17 DIAGNOSIS — N401 Enlarged prostate with lower urinary tract symptoms: Secondary | ICD-10-CM | POA: Diagnosis not present

## 2020-12-18 ENCOUNTER — Other Ambulatory Visit: Payer: Self-pay

## 2020-12-18 ENCOUNTER — Ambulatory Visit
Admission: RE | Admit: 2020-12-18 | Discharge: 2020-12-18 | Disposition: A | Discharge status: Home / Self Care | Payer: Medicare HMO | Source: Ambulatory Visit | Attending: Otolaryngology | Admitting: Otolaryngology

## 2020-12-18 DIAGNOSIS — R220 Localized swelling, mass and lump, head: Secondary | ICD-10-CM | POA: Diagnosis present

## 2020-12-18 LAB — POCT I-STAT CREATININE: Creatinine, Ser: 0.6 mg/dL — ABNORMAL LOW (ref 0.61–1.24)

## 2020-12-18 LAB — TESTOSTERONE: Testosterone: 430 ng/dL (ref 264–916)

## 2020-12-18 MED ORDER — IOHEXOL 350 MG/ML SOLN
75.0000 mL | Freq: Once | INTRAVENOUS | Status: AC | PRN
  Administered 2020-12-18: 75 mL via INTRAVENOUS

## 2020-12-25 ENCOUNTER — Other Ambulatory Visit: Payer: Self-pay | Admitting: Otolaryngology

## 2020-12-25 DIAGNOSIS — K148 Other diseases of tongue: Secondary | ICD-10-CM

## 2020-12-31 ENCOUNTER — Other Ambulatory Visit: Payer: Self-pay

## 2020-12-31 ENCOUNTER — Encounter: Payer: Medicare HMO | Attending: Internal Medicine | Admitting: Dietician

## 2020-12-31 ENCOUNTER — Encounter: Payer: Self-pay | Admitting: Dietician

## 2020-12-31 VITALS — Ht 64.0 in | Wt 167.3 lb

## 2020-12-31 DIAGNOSIS — E119 Type 2 diabetes mellitus without complications: Secondary | ICD-10-CM | POA: Diagnosis not present

## 2020-12-31 DIAGNOSIS — Z794 Long term (current) use of insulin: Secondary | ICD-10-CM | POA: Diagnosis present

## 2020-12-31 NOTE — Progress Notes (Signed)
Diabetes Self-Management Education  Visit Type:  Follow-up  Appt. Start Time: 1500 Appt. End Time: 1600  12/31/2020  [name], identified by name and date of birth, is a 70 y.o. male with a diagnosis of Diabetes:  .   ASSESSMENT  Height 5\' 4"  (1.626 m), weight 167 lb 4.8 oz (75.9 kg). Body mass index is 28.72 kg/m.    Diabetes Self-Management Education - 02/54/27 0623       Complications   How often do you check your blood sugar? > 4 times/day   using Freestyle Libre CGM   Fasting Blood glucose range (mg/dL) 70-129;<70;180-200;130-179   65-200, depends on nighttime snacks per patient   Have you had a dilated eye exam in the past 12 months? Yes    Have you had a dental exam in the past 12 months? Yes    Are you checking your feet? Yes    How many days per week are you checking your feet? 7      Dietary Intake   Breakfast 3 meals and 1-2 snacks daily      Exercise   Exercise Type ADL's   none in past months due to COVID illness and travel   How many days per week to you exercise? 1    How many minutes per day do you exercise? 30    Total minutes per week of exercise 30      Patient Education   Nutrition management  Role of diet in the treatment of diabetes and the relationship between the three main macronutrients and blood glucose level;Food label reading, portion sizes and measuring food.;Reviewed blood glucose goals for pre and post meals and how to evaluate the patients' food intake on their blood glucose level.;Meal timing in regards to the patients' current diabetes medication.;Meal options for control of blood glucose level and chronic complications.    Physical activity and exercise  Role of exercise on diabetes management, blood pressure control and cardiac health.    Monitoring Taught/discussed recording of test results and interpretation of SMBG.      Post-Education Assessment   Patient understands the diabetes disease and treatment process. Demonstrates  understanding / competency    Patient understands incorporating nutritional management into lifestyle. Demonstrates understanding / competency    Patient undertands incorporating physical activity into lifestyle. Demonstrates understanding / competency    Patient understands using medications safely. Demonstrates understanding / competency    Patient understands monitoring blood glucose, interpreting and using results Demonstrates understanding / competency    Patient understands prevention, detection, and treatment of acute complications. Demonstrates understanding / competency    Patient understands prevention, detection, and treatment of chronic complications. Demonstrates understanding / competency    Patient understands how to develop strategies to address psychosocial issues. Demonstrates understanding / competency    Patient understands how to develop strategies to promote health/change behavior. Demonstrates understanding / competency      Outcomes   Program Status Completed             Learning Objective:  Patient will have a greater understanding of diabetes self-management. Patient education plan is to attend individual and/or group sessions per assessed needs and concerns.  Additional Notes: Patient reports sometimes eating late night snacks such as rice cakes, popcorn, which he feels could be resulting in higher fasting BGs.  He has been limiting carb intake to 15-30g with most meals. Discussed effect of low carb intake with occasional high intake leading to more BG fluctuations. Encouraged  consistent intake of 30-45g of healthy carbs with meals to better control nighttime hunger and improve BG consistency.   Plan:  Call with any questions or concerns, or if additional follow up is needed.    Expected Outcomes:  Demonstrated interest in learning. Expect positive outcomes  Education material provided: Plate planner with food lists, sample menus  If problems or  questions, patient to contact team via:  Phone  Future DSME appointment: -

## 2021-01-02 ENCOUNTER — Ambulatory Visit
Admission: RE | Admit: 2021-01-02 | Discharge: 2021-01-02 | Disposition: A | Discharge status: Home / Self Care | Payer: Medicare HMO | Source: Ambulatory Visit | Attending: Otolaryngology | Admitting: Otolaryngology

## 2021-01-02 DIAGNOSIS — R930 Abnormal findings on diagnostic imaging of skull and head, not elsewhere classified: Secondary | ICD-10-CM | POA: Diagnosis not present

## 2021-01-02 DIAGNOSIS — J349 Unspecified disorder of nose and nasal sinuses: Secondary | ICD-10-CM | POA: Insufficient documentation

## 2021-01-02 DIAGNOSIS — H748X3 Other specified disorders of middle ear and mastoid, bilateral: Secondary | ICD-10-CM | POA: Diagnosis not present

## 2021-01-02 DIAGNOSIS — K148 Other diseases of tongue: Secondary | ICD-10-CM | POA: Diagnosis not present

## 2021-01-02 DIAGNOSIS — M4802 Spinal stenosis, cervical region: Secondary | ICD-10-CM | POA: Diagnosis not present

## 2021-01-02 DIAGNOSIS — M47812 Spondylosis without myelopathy or radiculopathy, cervical region: Secondary | ICD-10-CM | POA: Diagnosis not present

## 2021-01-02 MED ORDER — GADOBUTROL 1 MMOL/ML IV SOLN
7.5000 mL | Freq: Once | INTRAVENOUS | Status: AC | PRN
  Administered 2021-01-02: 7.5 mL via INTRAVENOUS

## 2021-02-05 ENCOUNTER — Encounter (HOSPITAL_COMMUNITY): Payer: Self-pay | Admitting: Radiology

## 2021-03-12 ENCOUNTER — Other Ambulatory Visit: Payer: Self-pay | Admitting: Urology

## 2021-03-12 DIAGNOSIS — E349 Endocrine disorder, unspecified: Secondary | ICD-10-CM

## 2021-07-06 ENCOUNTER — Other Ambulatory Visit: Payer: Self-pay | Admitting: Urology

## 2021-07-06 DIAGNOSIS — E349 Endocrine disorder, unspecified: Secondary | ICD-10-CM

## 2021-08-17 ENCOUNTER — Encounter: Payer: Self-pay | Admitting: Dermatology

## 2021-08-17 ENCOUNTER — Ambulatory Visit: Payer: Medicare HMO | Admitting: Dermatology

## 2021-08-17 DIAGNOSIS — L578 Other skin changes due to chronic exposure to nonionizing radiation: Secondary | ICD-10-CM

## 2021-08-17 DIAGNOSIS — L814 Other melanin hyperpigmentation: Secondary | ICD-10-CM

## 2021-08-17 DIAGNOSIS — I781 Nevus, non-neoplastic: Secondary | ICD-10-CM

## 2021-08-17 DIAGNOSIS — L219 Seborrheic dermatitis, unspecified: Secondary | ICD-10-CM | POA: Diagnosis not present

## 2021-08-17 DIAGNOSIS — Z1283 Encounter for screening for malignant neoplasm of skin: Secondary | ICD-10-CM

## 2021-08-17 DIAGNOSIS — D225 Melanocytic nevi of trunk: Secondary | ICD-10-CM | POA: Diagnosis not present

## 2021-08-17 DIAGNOSIS — D235 Other benign neoplasm of skin of trunk: Secondary | ICD-10-CM

## 2021-08-17 DIAGNOSIS — D692 Other nonthrombocytopenic purpura: Secondary | ICD-10-CM

## 2021-08-17 DIAGNOSIS — L821 Other seborrheic keratosis: Secondary | ICD-10-CM

## 2021-08-17 DIAGNOSIS — L570 Actinic keratosis: Secondary | ICD-10-CM

## 2021-08-17 DIAGNOSIS — D239 Other benign neoplasm of skin, unspecified: Secondary | ICD-10-CM

## 2021-08-17 DIAGNOSIS — D1800 Hemangioma unspecified site: Secondary | ICD-10-CM

## 2021-08-17 DIAGNOSIS — D229 Melanocytic nevi, unspecified: Secondary | ICD-10-CM

## 2021-08-17 NOTE — Progress Notes (Signed)
Follow-Up Visit   Subjective  Jacob Perkins. is a 71 y.o. male who presents for the following: Annual Exam (Skin cancer screening. Upper body exam. No Hx of skin cancer).  The patient presents for Upper Body Skin Exam (UBSE) for skin cancer screening and mole check.  The patient has spots, moles and lesions to be evaluated, some may be new or changing and the patient has concerns that these could be cancer.   The following portions of the chart were reviewed this encounter and updated as appropriate:      Review of Systems: No other skin or systemic complaints except as noted in HPI or Assessment and Plan.   Objective  Well appearing patient in no apparent distress; mood and affect are within normal limits.  All skin waist up examined.  Right Lower Back 8 x 4 mm medium brown speckled macule  Left  Back, Right Upper Back Dilated pores  Scalp x2, right ear helix x1 (3) Erythematous thin papules/macules with gritty scale.   Scalp Clear today    Assessment & Plan   Lentigines. Back. - Scattered tan macules - Due to sun exposure - Benign-appearing, observe - Recommend daily broad spectrum sunscreen SPF 30+ to sun-exposed areas, reapply every 2 hours as needed. - Call for any changes  Seborrheic Keratoses. Back, left mid. - Stuck-on, waxy, tan-brown papules and/or plaques  - Benign-appearing - Discussed benign etiology and prognosis. - Observe - Call for any changes  Melanocytic Nevi. Back. - Tan-brown and/or pink-flesh-colored symmetric macules and papules - Benign appearing on exam today - Observation - Call clinic for new or changing moles - Recommend daily use of broad spectrum spf 30+ sunscreen to sun-exposed areas.   Hemangiomas - Red papules - Discussed benign nature - Observe - Call for any changes  Actinic Damage - Chronic condition, secondary to cumulative UV/sun exposure - diffuse scaly erythematous macules with underlying dyspigmentation -  Recommend daily broad spectrum sunscreen SPF 30+ to sun-exposed areas, reapply every 2 hours as needed.  - Staying in the shade or wearing long sleeves, sun glasses (UVA+UVB protection) and wide brim hats (4-inch brim around the entire circumference of the hat) are also recommended for sun protection.  - Call for new or changing lesions.  Skin cancer screening performed today.  Telangiectasia. Right lower chest. 31m blanching pink macule. Also face, blanching pink/red macules - Dilated blood vessel - Benign appearing on exam - Call for changes  Purpura - Chronic; persistent and recurrent.  Treatable, but not curable. Arms. - Violaceous macules and patches - Benign - Related to trauma, age, sun damage and/or use of blood thinners, chronic use of topical and/or oral steroids - Observe - Can use OTC arnica containing moisturizer such as Dermend Bruise Formula if desired - Call for worsening or other concerns  Nevus Right Lower Back  Benign-appearing.  Observation.  Call clinic for new or changing lesions.  Recommend daily use of broad spectrum spf 30+ sunscreen to sun-exposed areas.    Dilated pore of [place] (2) Left  Back; Right Upper Back  Benign, observe.     AK (actinic keratosis) (3) Scalp x2, right ear helix x1  Actinic keratoses are precancerous spots that appear secondary to cumulative UV radiation exposure/sun exposure over time. They are chronic with expected duration over 1 year. A portion of actinic keratoses will progress to squamous cell carcinoma of the skin. It is not possible to reliably predict which spots will progress to skin cancer  and so treatment is recommended to prevent development of skin cancer.  Recommend daily broad spectrum sunscreen SPF 30+ to sun-exposed areas, reapply every 2 hours as needed.  Recommend staying in the shade or wearing long sleeves, sun glasses (UVA+UVB protection) and wide brim hats (4-inch brim around the entire circumference of the  hat). Call for new or changing lesions.  Destruction of lesion - Scalp x2, right ear helix x1  Destruction method: cryotherapy   Informed consent: discussed and consent obtained   Lesion destroyed using liquid nitrogen: Yes   Region frozen until ice ball extended beyond lesion: Yes   Outcome: patient tolerated procedure well with no complications   Post-procedure details: wound care instructions given   Additional details:  Prior to procedure, discussed risks of blister formation, small wound, skin dyspigmentation, or rare scar following cryotherapy. Recommend Vaseline ointment to treated areas while healing.   Seborrheic dermatitis Scalp  Chronic condition with duration or expected duration over one year. Currently well-controlled.   Continue Ketoconazole 2% cream 1-2 times daily as needed. Patient will call for refills.   Recommend H&S Shampoo as directed.    Return in about 1 year (around 08/18/2022) for UBSE.  I, Emelia Salisbury, CMA, am acting as scribe for Brendolyn Patty, MD.  Documentation: I have reviewed the above documentation for accuracy and completeness, and I agree with the above.  Brendolyn Patty MD

## 2021-08-17 NOTE — Patient Instructions (Addendum)
Cryotherapy Aftercare  Wash gently with soap and water everyday.   Apply Vaseline daily until healed.   Prior to procedure, discussed risks of blister formation, small wound, skin dyspigmentation, or rare scar following cryotherapy. Recommend Vaseline ointment to treated areas while healing.    Recommend daily broad spectrum sunscreen SPF 30+ to sun-exposed areas, reapply every 2 hours as needed. Call for new or changing lesions.  Staying in the shade or wearing long sleeves, sun glasses (UVA+UVB protection) and wide brim hats (4-inch brim around the entire circumference of the hat) are also recommended for sun protection.    Melanoma ABCDEs  Melanoma is the most dangerous type of skin cancer, and is the leading cause of death from skin disease.  You are more likely to develop melanoma if you: Have light-colored skin, light-colored eyes, or red or blond hair Spend a lot of time in the sun Tan regularly, either outdoors or in a tanning bed Have had blistering sunburns, especially during childhood Have a close family member who has had a melanoma Have atypical moles or large birthmarks  Early detection of melanoma is key since treatment is typically straightforward and cure rates are extremely high if we catch it early.   The first sign of melanoma is often a change in a mole or a new dark spot.  The ABCDE system is a way of remembering the signs of melanoma.  A for asymmetry:  The two halves do not match. B for border:  The edges of the growth are irregular. C for color:  A mixture of colors are present instead of an even brown color. D for diameter:  Melanomas are usually (but not always) greater than 76m - the size of a pencil eraser. E for evolution:  The spot keeps changing in size, shape, and color.  Please check your skin once per month between visits. You can use a small mirror in front and a large mirror behind you to keep an eye on the back side or your body.   If you see  any new or changing lesions before your next follow-up, please call to schedule a visit.  Please continue daily skin protection including broad spectrum sunscreen SPF 30+ to sun-exposed areas, reapplying every 2 hours as needed when you're outdoors.   Staying in the shade or wearing long sleeves, sun glasses (UVA+UVB protection) and wide brim hats (4-inch brim around the entire circumference of the hat) are also recommended for sun protection.     If You Need Anything After Your Visit  If you have any questions or concerns for your doctor, please call our main line at [phone] press option 4 to reach your doctor's medical assistant. If no one answers, please leave a voicemail as directed and we will return your call as soon as possible. Messages left after 4 pm will be answered the following business day.   You may also send [place] message via [place] We typically respond to MyChart messages within 1-2 business days.  For prescription refills, please ask your pharmacy to contact our office. Our fax number is [phone]  If you have an urgent issue when the clinic is closed that cannot wait until the next business day, you can page your doctor at the number below.    Please note that while we do our best to be available for urgent issues outside of office hours, we are not available 24/7.   If you have an urgent issue and are unable  to reach [place], you may choose to seek medical care at your doctor's office, retail clinic, urgent care center, or emergency room.  If you have a medical emergency, please immediately call 911 or go to the emergency department.  Pager Numbers  - Dr. Nehemiah Massed: [phone]  - Dr. Laurence Ferrari: [phone]  - Dr. Nicole Kindred: [phone]  In the event of inclement weather, please call our main line at [phone] for an update on the status of any delays or closures.  Dermatology Medication Tips: Please keep the boxes that topical medications come in in order  to help keep track of the instructions about where and how to use these. Pharmacies typically print the medication instructions only on the boxes and not directly on the medication tubes.   If your medication is too expensive, please contact our office at [phone] option 4 or send [place] a message through [place].   We are unable to tell what your co-pay for medications will be in advance as this is different depending on your insurance coverage. However, we may be able to find a substitute medication at lower cost or fill out paperwork to get insurance to cover a needed medication.   If a prior authorization is required to get your medication covered by your insurance company, please allow [place] 1-2 business days to complete this process.  Drug prices often vary depending on where the prescription is filled and some pharmacies may offer cheaper prices.  The website www.goodrx.com contains coupons for medications through different pharmacies. The prices here do not account for what the cost may be with help from insurance (it may be cheaper with your insurance), but the website can give you the price if you did not use any insurance.  - You can print the associated coupon and take it with your prescription to the pharmacy.  - You may also stop by our office during regular business hours and pick up a GoodRx coupon card.  - If you need your prescription sent electronically to a different pharmacy, notify our office through [facility] or by phone at [phone] option 4.     Si Usted Necesita Algo Despus de Su Visita  Tambin puede enviarnos un mensaje a travs de Pharmacist, community. Por lo general respondemos a los mensajes de MyChart en el transcurso de 1 a 2 das hbiles.  Para renovar recetas, por favor pida a su farmacia que se ponga en contacto con nuestra oficina. Harland Dingwall de fax es [place] [phone].  Si tiene un asunto urgente cuando la clnica est cerrada y que no puede esperar  hasta el siguiente da hbil, puede llamar/localizar a su doctor(a) al nmero que aparece a continuacin.   Por favor, tenga en cuenta que aunque hacemos todo lo posible para estar disponibles para asuntos urgentes fuera del horario de [place], no estamos disponibles las 24 horas del da, los 7 das de la [place].   Si tiene un problema urgente y no puede comunicarse con nosotros, puede optar por buscar atencin mdica  en el consultorio de su doctor(a), en una clnica privada, en un centro de atencin urgente o en una sala de emergencias.  Si tiene Engineering geologist, por favor llame inmediatamente al 911 o vaya a la sala de emergencias.  Nmeros de bper  - Dr. Nehemiah Massed: [phone]  - Dra. Moye: [phone]  - Dra. Nicole Kindred: [phone]  En caso de inclemencias del [place], por favor llame a nuestra lnea principal al [phone] para una actualizacin sobre el [place] de  cualquier retraso o cierre.  Consejos para la medicacin en dermatologa: Por favor, guarde las cajas en las que vienen los medicamentos de uso tpico para ayudarle a seguir las instrucciones sobre dnde y cmo usarlos. Las farmacias generalmente imprimen las instrucciones del medicamento slo en las cajas y no directamente en los tubos del [place].   Si su medicamento es muy caro, por favor, pngase en contacto con Zigmund Daniel llamando al [phone] y presione la opcin 4 o envenos un mensaje a travs de Pharmacist, community.   No podemos decirle cul ser su copago por los medicamentos por adelantado ya que esto es diferente dependiendo de la cobertura de su seguro. Sin embargo, es posible que podamos encontrar un medicamento sustituto a Electrical engineer un formulario para que el seguro cubra el medicamento que se considera necesario.   Si se requiere una autorizacin previa para que su compaa de seguros [place] su medicamento, por favor permtanos de 1 a 2 das hbiles para completar este proceso.  Los precios  de los medicamentos varan con frecuencia dependiendo del Environmental consultant de dnde se surte la receta y alguna farmacias pueden ofrecer precios ms baratos.  El sitio web www.goodrx.com tiene cupones para medicamentos de Airline pilot. Los precios aqu no tienen en cuenta lo que podra costar con la ayuda del seguro (puede ser ms barato con su seguro), pero el sitio web puede darle el precio si no utiliz Research scientist (physical sciences).  - Puede imprimir el cupn correspondiente y llevarlo con su receta a la farmacia.  - Tambin puede pasar por nuestra oficina durante el horario de atencin regular y Charity fundraiser una tarjeta de cupones de GoodRx.  - Si necesita que su receta se enve electrnicamente a una farmacia diferente, informe a nuestra oficina a travs de MyChart de  o por telfono llamando al [phone] y presione la opcin 4.

## 2021-11-02 ENCOUNTER — Other Ambulatory Visit: Payer: Self-pay | Admitting: Urology

## 2021-11-02 DIAGNOSIS — E349 Endocrine disorder, unspecified: Secondary | ICD-10-CM

## 2021-12-20 NOTE — Progress Notes (Unsigned)
12/21/21 2:34 PM   Jacob Pat Jr. 04-26-1950 [id number]  Referring provider:  Baxter Hire, MD [place],  [place] [address]  Chief Complaint  Patient presents with   Benign Prostatic Hypertrophy   Hematuria   Erectile Dysfunction   Urological history: 1. Testosterone deficiency  -testosterone pending -Hemoglobin/hematocrit (09/2021) 4.52/13/9 - AST/ALT (09/2021) 19/22 -managed with Clomid 50 mg daily   2. Erectile dysfunction  - risk factors for ED are age, BPH, testosterone deficiency, spinal stenosis, DM, HTN, HLD and anxiety - declined treatment   3. BPH with LUTS  -PSA (09/2021) 0.72 -s/p HoLEP on 05/25/2018  -I PSS 15/3   HPI: Jacob Perkins. is a 71 y.o.male who presents today for one year follow up.   He is having weak urinary stream first thing in the am.  Patient denies any modifying or aggravating factors.  Patient denies any gross hematuria, dysuria or suprapubic/flank pain.  Patient denies any fevers, chills, nausea or vomiting.     IPSS     Row Name 12/21/21 1400         International Prostate Symptom Score   How often have you had the sensation of not emptying your bladder? About half the time     How often have you had to urinate less than every two hours? About half the time     How often have you found you stopped and started again several times when you urinated? About half the time     How often have you found it difficult to postpone urination? Less than half the time     How often have you had a weak urinary stream? About half the time     How often have you had to strain to start urination? Less than half the time     How many times did you typically get up at night to urinate? 1 Time     Total IPSS Score 17       Quality of Life due to urinary symptoms   If you were to spend the rest of your life with your urinary condition just the way it is now how would you feel about that? Mixed                Score:  1-7 Mild 8-19 Moderate 20-35 Severe   PMH: Past Medical History:  Diagnosis Date   Adenomatous polyp    Allergy    Anxiety    Arthritis    BPH (benign prostatic hypertrophy)    Diabetes mellitus    Diverticulosis    GERD (gastroesophageal reflux disease)    History of kidney stones    Hyperlipidemia    Hypertension    Rosacea    Spinal stenosis    disc disease    Surgical History: Past Surgical History:  Procedure Laterality Date   ASD REPAIR     BACK SURGERY     COLONOSCOPY WITH PROPOFOL N/A 10/16/2018   Procedure: COLONOSCOPY WITH PROPOFOL;  Surgeon: Toledo, Benay Pike, MD;  Location: [facility];  Service: Gastroenterology;  Laterality: N/A;   HOLEP-LASER ENUCLEATION OF THE PROSTATE WITH MORCELLATION N/A 05/25/2018   Procedure: HOLEP-LASER ENUCLEATION OF THE PROSTATE WITH MORCELLATION;  Surgeon: Billey Co, MD;  Location: [facility] ORS;  Service: Urology;  Laterality: N/A;   KNEE ARTHROSCOPY Right 04/15/2020   Procedure: ARTHROSCOPY KNEE;  Surgeon: Dereck Leep, MD;  Location: [facility] ORS;  Service: Orthopedics;  Laterality: Right;  L5-S1 Diskectomy  7/13   Dr Arnoldo Morale   LUMBAR FUSION     l4-l5   SPINAL CORD DECOMPRESSION      Home Medications:  Allergies as of 12/21/2021   No Known Allergies      Medication List        Accurate as of December 21, 2021  2:34 PM. If you have any questions, ask your nurse or doctor.          Adult One Daily Gummies Chew Chew 2 capsules by mouth at bedtime.   aspirin 81 MG tablet Take 81 mg by mouth at bedtime.   B-D ULTRAFINE III SHORT PEN 31G X 8 MM Misc Generic drug: Insulin Pen Needle USE AS DIRECTED   Clomid 50 MG tablet Generic drug: clomiPHENE TAKE 1 TABLET BY MOUTH ONCE A DAY   diazepam 10 MG tablet Commonly known as: Valium Take one hour prior to procedure.  Must have a driver. Started by: Zara Council, PA-C   esomeprazole 40 MG capsule Commonly known as: NEXIUM Take 40 mg by  mouth every other day.   fish oil-omega-3 fatty acids 1000 MG capsule Take 1 g by mouth 2 (two) times daily.   fluticasone 50 MCG/ACT nasal spray Commonly known as: FLONASE USE 2 SPRAYS IN EACH NOSTRIL            EVERY DAY What changed:  when to take this reasons to take this   gabapentin 300 MG capsule Commonly known as: NEURONTIN Take 300 mg by mouth 2 (two) times daily.   GLUCOSAMINE-CHONDROITIN PO Take 1 tablet by mouth 2 (two) times daily.   HYDROcodone-acetaminophen 5-325 MG tablet Commonly known as: Norco Take 1-2 tablets by mouth every 4 (four) hours as needed for moderate pain.   hydrocortisone cream 1 % Apply 1 application topically daily as needed for itching.   ibuprofen 200 MG tablet Commonly known as: ADVIL Take 400-600 mg by mouth every 6 (six) hours as needed for headache or moderate pain.   ketoconazole 2 % cream Commonly known as: NIZORAL Apply 1 application topically daily.   ketotifen 0.025 % ophthalmic solution Commonly known as: ZADITOR Place 1 drop into both eyes 2 (two) times daily as needed (allergies).   Levemir FlexTouch 100 UNIT/ML FlexPen Generic drug: insulin detemir Inject 40 Units into the skin at bedtime.   Melatonin 5 MG Caps Take 5-10 mg by mouth at bedtime as needed (sleep).   metFORMIN 1000 MG tablet Commonly known as: GLUCOPHAGE TAKE ONE TABLET TWICE A DAY What changed: when to take this   mirtazapine 15 MG tablet Commonly known as: REMERON Take 15 mg by mouth at bedtime.   Naproxen Sod-diphenhydrAMINE 220-25 MG Tabs Take 1 tablet by mouth at bedtime.   ONE TOUCH ULTRA TEST test strip Generic drug: glucose blood USE AS DIRECTED   ramipril 10 MG capsule Commonly known as: ALTACE Take 10 mg by mouth daily.   simvastatin 40 MG tablet Commonly known as: ZOCOR TAKE 1 TABLET EVERY DAY What changed: when to take this   Trulicity 3 ZO/1.0RU Sopn Generic drug: Dulaglutide Inject 3 mg into the skin once a week.    vitamin C 1000 MG tablet Take 500 mg by mouth daily.        Allergies:  No Known Allergies  Family History: Family History  Problem Relation Age of Onset   Diabetes Father    Urolithiasis Father    Alcohol abuse Paternal Aunt    Kidney disease Cousin  Kidney cancer Neg Hx    Prostate cancer Neg Hx    Bladder Cancer Neg Hx     Social History:  reports that he quit smoking about 14 years ago. His smoking use included cigars. He has never used smokeless tobacco. He reports current alcohol use. He reports that he does not use drugs.   Physical Exam: BP (!) 171/79   Pulse (!) 45   Ht 5' 4"  (1.626 m)   Wt 163 lb (73.9 kg)   BMI 27.98 kg/m   Constitutional:  Well nourished. Alert and oriented, No acute distress. HEENT: [place] AT, moist mucus membranes.  Trachea midline Cardiovascular: No clubbing, cyanosis, or edema. Respiratory: Normal respiratory effort, no increased work of breathing. Neurologic: Grossly intact, no focal deficits, moving all 4 extremities. Psychiatric: Normal mood and affect.   Laboratory Data: Hemoglobin A1C 4.2 - 5.6 % 7.2 High    Average Blood Glucose (Calc) mg/dL 160   Resulting [place] - LAB  Narrative Performed by [facility] - LAB Normal Range:    4.2 - 5.6%  Increased Risk:  5.7 - 6.4%  Diabetes:        >= 6.5%  Glycemic Control for adults with diabetes:  <7%    Specimen Collected: 10/18/21 10:32   Performed by: Eugenio Saenz: 10/18/21 13:40  Received From: [place]  Result Received: 11/02/21 16:54   Glucose 70 - 110 mg/dL 102   Sodium 136 - 145 mmol/L 141   Potassium 3.6 - 5.1 mmol/L 4.2   Chloride 97 - 109 mmol/L 104   Carbon Dioxide (CO2) 22.0 - 32.0 mmol/L 27.7   Urea Nitrogen (BUN) 7 - 25 mg/dL 18   Creatinine 0.7 - 1.3 mg/dL 0.6 Low    Glomerular Filtration Rate (eGFR), MDRD Estimate >60 mL/min/1.73sq m 133   Calcium 8.7 - 10.3 mg/dL 9.0   AST  8 - 39  U/L 19   ALT  6 - 57 U/L 22   Alk Phos (alkaline Phosphatase) 34 - 104 U/L 23 Low    Albumin 3.5 - 4.8 g/dL 4.2   Bilirubin, Total 0.3 - 1.2 mg/dL 0.6   Protein, Total 6.1 - 7.9 g/dL 6.0 Low    A/G Ratio 1.0 - 5.0 gm/dL 2.3   Resulting Agency  [place] - LAB   Specimen Collected: 10/18/21 10:32   Performed by: [place]: 10/18/21 15:11  Received From: [place]  Result Received: 11/02/21 16:54   WBC (White Blood Cell Count) 4.1 - 10.2 10^3/uL 8.4   RBC (Red Blood Cell Count) 4.69 - 6.13 10^6/uL 4.52 Low    Hemoglobin 14.1 - 18.1 gm/dL 13.9 Low    Hematocrit 40.0 - 52.0 % 41.0   MCV (Mean Corpuscular Volume) 80.0 - 100.0 fl 90.7   MCH (Mean Corpuscular Hemoglobin) 27.0 - 31.2 pg 30.8   MCHC (Mean Corpuscular Hemoglobin Concentration) 32.0 - 36.0 gm/dL 33.9   Platelet Count 150 - 450 10^3/uL 192   RDW-CV (Red Cell Distribution Width) 11.6 - 14.8 % 13.1   MPV (Mean Platelet Volume) 9.4 - 12.4 fl 11.2   Neutrophils 1.50 - 7.80 10^3/uL 4.87   Lymphocytes 1.00 - 3.60 10^3/uL 2.31   Monocytes 0.00 - 1.50 10^3/uL 0.89   Eosinophils 0.00 - 0.55 10^3/uL 0.27   Basophils 0.00 - 0.09 10^3/uL 0.03   Neutrophil % 32.0 - 70.0 % 58.1  Lymphocyte % 10.0 - 50.0 % 27.6   Monocyte % 4.0 - 13.0 % 10.6   Eosinophil % 1.0 - 5.0 % 3.2   Basophil% 0.0 - 2.0 % 0.4   Immature Granulocyte % <=0.7 % 0.1   Immature Granulocyte Count <=0.06 10^3/L 0.01   Resulting Agency  [place] - LAB   Specimen Collected: 10/18/21 10:32   Performed by: [place]: 10/18/21 12:01  Received From: [place]  Result Received: 11/02/21 16:54   Cholesterol, Total 100 - 200 mg/dL 140   Triglyceride 35 - 199 mg/dL 150   HDL (High Density Lipoprotein) Cholesterol 29.0 - 71.0 mg/dL 38.3   LDL Calculated 0 - 130 mg/dL 72   VLDL Cholesterol mg/dL 30   Cholesterol/HDL Ratio  3.7   Resulting Agency   [place] - LAB   Specimen Collected: 10/18/21 10:32   Performed by: [place]: 10/18/21 15:12  Received From: [place]  Result Received: 11/02/21 16:54   PSA (Prostate Specific Antigen), Total 0.10 - 4.00 ng/mL 0.72   Resulting Agency  [place] - LAB  Narrative Performed by [facility] - LAB Test results were determined with Beckman Coulter Hybritech Assay. Values obtained with different assay methods cannot be used interchangeably in serial testing. Assay results should not be interpreted as absolute evidence of the presence or absence of malignant disease  Specimen Collected: 10/18/21 10:32   Performed by: [place]: 10/18/21 15:04  Received From: [place]  Result Received: 11/02/21 16:54   Urinalysis Color Yellow, Violet, Light Violet, Dark Violet Yellow   Clarity Clear, Other SL Cloudy Abnormal    Specific Gravity 1.000 - 1.030 1.025   pH, Urine 5.0 - 8.0 6.0   Protein, Urinalysis Negative, Trace mg/dL Trace   Glucose, Urinalysis Negative mg/dL Negative   Ketones, Urinalysis Negative mg/dL Negative   Blood, Urinalysis Negative Small Abnormal    Nitrite, Urinalysis Negative Negative   Leukocyte Esterase, Urinalysis Negative Negative   White Blood Cells, Urinalysis None Seen, 0-3 /hpf None Seen   Red Blood Cells, Urinalysis None Seen, 0-3 /hpf 4-10 Abnormal    Bacteria, Urinalysis None Seen /hpf Few Abnormal    Squamous Epithelial Cells, Urinalysis Rare, Few, None Seen /hpf Rare   Resulting Agency  [place] - LAB   Specimen Collected: 10/18/21 10:32   Performed by: [place]: 10/18/21 11:00  Received From: [place]  Result Received: 11/02/21 16:54  I have reviewed the labs.   Pertinent Imaging: No results found for any visits on 12/21/21.   Assessment & Plan:     Testosterone deficiency  - Testosterone pending  - continue Clomid 50 mg daily   2. BPH with LUTS - s/p HoLEP 04/2018  -PSA stable -UA micro heme in 09/2021 -continue conservative management, avoiding bladder irritants and timed voiding's -cysto pending   3. ED - Discussed prescribing PDE5i but he declined   4. High risk hematuria -former smoker -UA w/ PCP noted 4-10 RBC's -ordered CT urogram and cysto per AUA guidelines for high risk AMH  -UA negative -urine sent for culture  -Prescribed Valium to have on hand as he states his cystoscopy with Dr. Pilar Jarvis was very uncomfortable and I advised him that he needed to have a driver with him   Return for CT Urogram report  and cystoscopy w/ Dr. Diamantina Providence for micro heme .  Cameren Earnest, [place] [address], [place], [place] [address] [phone]

## 2021-12-21 ENCOUNTER — Ambulatory Visit: Payer: Medicare HMO | Admitting: Urology

## 2021-12-21 ENCOUNTER — Encounter: Payer: Self-pay | Admitting: Urology

## 2021-12-21 VITALS — BP 171/79 | HR 45 | Ht 64.0 in | Wt 163.0 lb

## 2021-12-21 DIAGNOSIS — N401 Enlarged prostate with lower urinary tract symptoms: Secondary | ICD-10-CM

## 2021-12-21 DIAGNOSIS — N529 Male erectile dysfunction, unspecified: Secondary | ICD-10-CM | POA: Diagnosis not present

## 2021-12-21 DIAGNOSIS — E349 Endocrine disorder, unspecified: Secondary | ICD-10-CM

## 2021-12-21 DIAGNOSIS — R3129 Other microscopic hematuria: Secondary | ICD-10-CM

## 2021-12-21 DIAGNOSIS — E291 Testicular hypofunction: Secondary | ICD-10-CM

## 2021-12-21 LAB — MICROSCOPIC EXAMINATION: Bacteria, UA: NONE SEEN

## 2021-12-21 LAB — URINALYSIS, COMPLETE
Bilirubin, UA: NEGATIVE
Glucose, UA: NEGATIVE
Ketones, UA: NEGATIVE
Leukocytes,UA: NEGATIVE
Nitrite, UA: NEGATIVE
Protein,UA: NEGATIVE
RBC, UA: NEGATIVE
Specific Gravity, UA: 1.02 (ref 1.005–1.030)
Urobilinogen, Ur: 0.2 mg/dL (ref 0.2–1.0)
pH, UA: 5 (ref 5.0–7.5)

## 2021-12-21 MED ORDER — DIAZEPAM 10 MG PO TABS: ORAL_TABLET | ORAL | 0 refills | Status: AC

## 2021-12-22 LAB — TESTOSTERONE: Testosterone: 526 ng/dL (ref 264–916)

## 2021-12-24 LAB — CULTURE, URINE COMPREHENSIVE

## 2022-01-04 ENCOUNTER — Ambulatory Visit
Admission: RE | Admit: 2022-01-04 | Discharge: 2022-01-04 | Disposition: A | Discharge status: Home / Self Care | Payer: Medicare HMO | Source: Ambulatory Visit | Attending: Urology | Admitting: Urology

## 2022-01-04 DIAGNOSIS — R3129 Other microscopic hematuria: Secondary | ICD-10-CM | POA: Diagnosis present

## 2022-01-04 LAB — POCT I-STAT CREATININE: Creatinine, Ser: 0.8 mg/dL (ref 0.61–1.24)

## 2022-01-04 MED ORDER — IOHEXOL 300 MG/ML  SOLN
125.0000 mL | Freq: Once | INTRAMUSCULAR | Status: AC | PRN
  Administered 2022-01-04: 125 mL via INTRAVENOUS

## 2022-01-06 ENCOUNTER — Encounter: Payer: Self-pay | Admitting: Urology

## 2022-01-06 ENCOUNTER — Ambulatory Visit: Payer: Medicare HMO | Admitting: Urology

## 2022-01-06 VITALS — BP 125/68 | HR 41 | Ht 64.0 in | Wt 163.0 lb

## 2022-01-06 DIAGNOSIS — R3121 Asymptomatic microscopic hematuria: Secondary | ICD-10-CM | POA: Diagnosis not present

## 2022-01-06 DIAGNOSIS — N401 Enlarged prostate with lower urinary tract symptoms: Secondary | ICD-10-CM

## 2022-01-06 LAB — URINALYSIS, COMPLETE
Bilirubin, UA: NEGATIVE
Glucose, UA: NEGATIVE
Nitrite, UA: NEGATIVE
Protein,UA: NEGATIVE
RBC, UA: NEGATIVE
Specific Gravity, UA: 1.025 (ref 1.005–1.030)
Urobilinogen, Ur: 0.2 mg/dL (ref 0.2–1.0)
pH, UA: 5 (ref 5.0–7.5)

## 2022-01-06 LAB — MICROSCOPIC EXAMINATION: Bacteria, UA: NONE SEEN

## 2022-01-06 MED ORDER — CEPHALEXIN 250 MG PO CAPS: 500.0000 mg | ORAL_CAPSULE | Freq: Once | ORAL | Status: AC

## 2022-01-06 NOTE — Progress Notes (Signed)
Cystoscopy Procedure Note:  Indication: Microscopic hematuria  After informed consent and discussion of the procedure and its risks, Jacob Perkins. was positioned and prepped in the standard fashion. Cystoscopy was performed with a flexible cystoscope. The urethra, bladder neck and entire bladder was visualized in a standard fashion. There was a small amount of prostatic regrowth, but open channel into the bladder.  The ureteral orifices were visualized in their normal location and orientation.  Mild bladder trabeculations, no suspicious lesions.  No abnormalities on retroflexion.  Imaging: CT urogram with no abnormalities  Findings: Normal cystoscopy, mild regrowth of prostate adenoma likely etiology of microscopic hematuria  Assessment and Plan: New follow-up with Zara Council, PA yearly for low testosterone  Nickolas Madrid, MD 01/06/2022

## 2022-01-10 ENCOUNTER — Telehealth: Payer: Self-pay

## 2022-01-10 NOTE — Telephone Encounter (Signed)
Pt switched to [facility] schedule for next years appt.

## 2022-01-10 NOTE — Telephone Encounter (Signed)
-----   Message from Nori Riis, PA-C sent at 01/08/2022  6:11 PM EDT ----- He just needs to be switched over to my schedule for next year.

## 2022-03-01 ENCOUNTER — Other Ambulatory Visit: Payer: Self-pay | Admitting: Physician Assistant

## 2022-03-01 DIAGNOSIS — E349 Endocrine disorder, unspecified: Secondary | ICD-10-CM

## 2022-04-06 DIAGNOSIS — E349 Endocrine disorder, unspecified: Secondary | ICD-10-CM

## 2022-04-07 ENCOUNTER — Other Ambulatory Visit: Payer: Self-pay | Admitting: Urology

## 2022-04-07 DIAGNOSIS — E349 Endocrine disorder, unspecified: Secondary | ICD-10-CM

## 2022-04-07 MED ORDER — TESTOSTERONE 50 MG/5GM (1%) TD GEL: 5.0000 g | Freq: Every day | TRANSDERMAL | 3 refills | Status: DC

## 2022-05-05 ENCOUNTER — Other Ambulatory Visit: Payer: Medicare HMO

## 2022-05-05 DIAGNOSIS — E349 Endocrine disorder, unspecified: Secondary | ICD-10-CM

## 2022-05-06 LAB — TESTOSTERONE: Testosterone: 779 ng/dL (ref 264–916)

## 2022-08-23 ENCOUNTER — Ambulatory Visit: Payer: Medicare HMO | Admitting: Dermatology

## 2022-08-23 VITALS — BP 130/69 | HR 55

## 2022-08-23 DIAGNOSIS — L814 Other melanin hyperpigmentation: Secondary | ICD-10-CM

## 2022-08-23 DIAGNOSIS — X32XXXA Exposure to sunlight, initial encounter: Secondary | ICD-10-CM

## 2022-08-23 DIAGNOSIS — Z1283 Encounter for screening for malignant neoplasm of skin: Secondary | ICD-10-CM | POA: Diagnosis not present

## 2022-08-23 DIAGNOSIS — L821 Other seborrheic keratosis: Secondary | ICD-10-CM

## 2022-08-23 DIAGNOSIS — L219 Seborrheic dermatitis, unspecified: Secondary | ICD-10-CM | POA: Diagnosis not present

## 2022-08-23 DIAGNOSIS — L739 Follicular disorder, unspecified: Secondary | ICD-10-CM | POA: Diagnosis not present

## 2022-08-23 DIAGNOSIS — W908XXA Exposure to other nonionizing radiation, initial encounter: Secondary | ICD-10-CM

## 2022-08-23 DIAGNOSIS — Z872 Personal history of diseases of the skin and subcutaneous tissue: Secondary | ICD-10-CM

## 2022-08-23 DIAGNOSIS — I781 Nevus, non-neoplastic: Secondary | ICD-10-CM

## 2022-08-23 DIAGNOSIS — D229 Melanocytic nevi, unspecified: Secondary | ICD-10-CM

## 2022-08-23 DIAGNOSIS — D225 Melanocytic nevi of trunk: Secondary | ICD-10-CM

## 2022-08-23 DIAGNOSIS — L578 Other skin changes due to chronic exposure to nonionizing radiation: Secondary | ICD-10-CM

## 2022-08-23 MED ORDER — FLUOCINONIDE 0.05 % EX SOLN: CUTANEOUS | 3 refills | Status: AC

## 2022-08-23 MED ORDER — KETOCONAZOLE 2 % EX CREA: TOPICAL_CREAM | CUTANEOUS | 11 refills | Status: AC

## 2022-08-23 MED ORDER — CLINDAMYCIN PHOSPHATE 1 % EX SOLN: CUTANEOUS | 3 refills | Status: AC

## 2022-08-23 NOTE — Progress Notes (Signed)
Follow-Up Visit   Subjective  Jacob Perkins. is a 72 y.o. male who presents for the following: Skin Cancer Screening and Upper Body Skin Exam  The patient presents for Upper Body Skin Exam (UBSE) for skin cancer screening and mole check. The patient has spots, moles and lesions to be evaluated, some may be new or changing.    The following portions of the chart were reviewed this encounter and updated as appropriate: medications, allergies, medical history  Review of Systems:  No other skin or systemic complaints except as noted in HPI or Assessment and Plan.  Objective  Well appearing patient in no apparent distress; mood and affect are within normal limits.  All skin waist up examined. Relevant physical exam findings are noted in the Assessment and Plan.    Assessment & Plan   FOLLICULITIS Exam: Crusted follicular papules posterior scalp  Chronic and persistent condition with duration or expected duration over one year. Condition is bothersome/symptomatic for patient. Currently flared.   Treatment Plan: Recommend Head & Shoulders shampoo, let sit several minutes before rinsing.  Start clindamycin solution Apply to AA BID prn flares  Start fluocinonide solution Apply to AA scalp qd/bid prn itch  SEBORRHEIC DERMATITIS Exam: Pink patches at glabella/eyebrows  Chronic condition with duration or expected duration over one year. Currently well-controlled.  Seborrheic Dermatitis is a chronic persistent rash characterized by pinkness and scaling most commonly of the mid face but also can occur on the scalp (dandruff), ears; mid chest, mid back and groin.  It tends to be exacerbated by stress and cooler weather.  People who have neurologic disease may experience new onset or exacerbation of existing seborrheic dermatitis.  The condition is not curable but treatable and can be controlled.  Treatment Plan: Continue ketoconazole 2% cream apply to AA face qd/bid 1 yr  Rf.  Lentigines, Seborrheic Keratoses, Hemangiomas - Benign normal skin lesions - Benign-appearing - Call for any changes  Melanocytic Nevi - Tan-brown and/or pink-flesh-colored symmetric macules and papules - Right Lower Back 8 x 4 mm medium brown speckled macule, stable - Benign appearing on exam today - Observation - Call clinic for new or changing moles - Recommend daily use of broad spectrum spf 30+ sunscreen to sun-exposed areas.   Actinic Damage - Chronic condition, secondary to cumulative UV/sun exposure - diffuse scaly erythematous macules with underlying dyspigmentation - Recommend daily broad spectrum sunscreen SPF 30+ to sun-exposed areas, reapply every 2 hours as needed.  - Staying in the shade or wearing long sleeves, sun glasses (UVA+UVB protection) and wide brim hats (4-inch brim around the entire circumference of the hat) are also recommended for sun protection.  - Call for new or changing lesions.  Skin cancer screening performed today.  Telangiectasia. Right lower chest. 7mm blanching pink macule, stable. Also face, blanching pink/red macules - Dilated blood vessel - Benign appearing on exam - Call for changes  HISTORY OF PRECANCEROUS ACTINIC KERATOSIS - site(s) of PreCancerous Actinic Keratosis clear today. - these may recur and new lesions may form requiring treatment to prevent transformation into skin cancer - observe for new or changing spots and contact [facility] for appointment if occur - photoprotection with sun protective clothing; sunglasses and broad spectrum sunscreen with SPF of at least 30 + and frequent self skin exams recommended - yearly exams by a dermatologist recommended for persons with history of PreCancerous Actinic Keratoses  Return in about 1 year (around 08/23/2023) for UBSE, Hx AKs.  I,  Cherlyn Labella, CMA, am acting as scribe for Willeen Niece, MD .   Documentation: I have reviewed the above documentation for accuracy and  completeness, and I agree with the above.  Willeen Niece, MD

## 2022-08-23 NOTE — Patient Instructions (Addendum)
Start clindamycin solution - apply to bumps on scalp once to twice daily as needed for flares.   Start fluocinonide solution - apply to bumps on scalp once to twice daily as needed for itch. Avoid face.  Head & Shoulders - massage into scalp and let sit several minutes before rinsing.   Due to recent changes in healthcare laws, you may see results of your pathology and/or laboratory studies on MyChart before the doctors have had a chance to review them. We understand that in some cases there may be results that are confusing or concerning to you. Please understand that not all results are received at the same time and often the doctors may need to interpret multiple results in order to provide you with the best plan of care or course of treatment. Therefore, we ask that you please give [place] 2 business days to thoroughly review all your results before contacting the office for clarification. Should we see a critical lab result, you will be contacted sooner.   If You Need Anything After Your Visit  If you have any questions or concerns for your doctor, please call our main line at [phone] and press option 4 to reach your doctor's medical assistant. If no one answers, please leave a voicemail as directed and we will return your call as soon as possible. Messages left after 4 pm will be answered the following business day.   You may also send [place] a message via MyChart. We typically respond to MyChart messages within 1-2 business days.  For prescription refills, please ask your pharmacy to contact our office. Our fax number is [phone].  If you have an urgent issue when the clinic is closed that cannot wait until the next business day, you can page your doctor at the number below.    Please note that while we do our best to be available for urgent issues outside of office hours, we are not available 24/7.   If you have an urgent issue and are unable to reach [place], you may choose to seek medical  care at your doctor's office, retail clinic, urgent care center, or emergency room.  If you have a medical emergency, please immediately call 911 or go to the emergency department.  Pager Numbers  - Dr. Gwen Pounds: [phone]  - Dr. Neale Burly: [phone]  - Dr. Roseanne Reno: [phone]  In the event of inclement weather, please call our main line at [phone] for an update on the status of any delays or closures.  Dermatology Medication Tips: Please keep the boxes that topical medications come in in order to help keep track of the instructions about where and how to use these. Pharmacies typically print the medication instructions only on the boxes and not directly on the medication tubes.   If your medication is too expensive, please contact our office at [phone] option 4 or send [place] a message through MyChart.   We are unable to tell what your co-pay for medications will be in advance as this is different depending on your insurance coverage. However, we may be able to find a substitute medication at lower cost or fill out paperwork to get insurance to cover a needed medication.   If a prior authorization is required to get your medication covered by your insurance company, please allow [place] 1-2 business days to complete this process.  Drug prices often vary depending on where the prescription is filled and some pharmacies may offer cheaper prices.  The website www.goodrx.com contains coupons  for medications through different pharmacies. The prices here do not account for what the cost may be with help from insurance (it may be cheaper with your insurance), but the website can give you the price if you did not use any insurance.  - You can print the associated coupon and take it with your prescription to the pharmacy.  - You may also stop by our office during regular business hours and pick up a GoodRx coupon card.  - If you need your prescription sent electronically to a different  pharmacy, notify our office through [facility] or by phone at [phone] option 4.     Si Usted Necesita Algo Despus de Su Visita  Tambin puede enviarnos un mensaje a travs de Clinical cytogeneticist. Por lo general respondemos a los mensajes de MyChart en el transcurso de 1 a 2 das hbiles.  Para renovar recetas, por favor pida a su farmacia que se ponga en contacto con nuestra oficina. Annie Sable de fax es [place] [phone].  Si tiene un asunto urgente cuando la clnica est cerrada y que no puede esperar hasta el siguiente da hbil, puede llamar/localizar a su doctor(a) al nmero que aparece a continuacin.   Por favor, tenga en cuenta que aunque hacemos todo lo posible para estar disponibles para asuntos urgentes fuera del horario de [place], no estamos disponibles las 24 horas del da, los 7 [address]  [address] de la [place].   Si tiene un problema urgente y no puede comunicarse con nosotros, puede optar por buscar atencin mdica  en el consultorio de su doctor(a), en una clnica privada, en un centro de atencin urgente o en una sala de emergencias.  Si tiene Engineer, drilling, por favor llame inmediatamente al 911 o vaya a la sala de emergencias.  Nmeros de bper  - Dr. Gwen Pounds: [phone]  - Dra. Moye: [phone]  - Dra. Roseanne Reno: [phone]  En caso de inclemencias del [place], por favor llame a Lacy Duverney principal al [phone] para una actualizacin sobre el [place] de cualquier retraso o cierre.  Consejos para la medicacin en dermatologa: Por favor, guarde las cajas en las que vienen los medicamentos de uso tpico para ayudarle a seguir las instrucciones sobre dnde y cmo usarlos. Las farmacias generalmente imprimen las instrucciones del medicamento slo en las cajas y no directamente en los tubos del [place].   Si su medicamento es muy caro, por favor, pngase en contacto con Rolm Gala llamando al [phone] y presione la opcin 4 o envenos un mensaje a  travs de Clinical cytogeneticist.   No podemos decirle cul ser su copago por los medicamentos por adelantado ya que esto es diferente dependiendo de la cobertura de su seguro. Sin embargo, es posible que podamos encontrar un medicamento sustituto a Audiological scientist un formulario para que el seguro cubra el medicamento que se considera necesario.   Si se requiere una autorizacin previa para que su compaa de seguros [place] su medicamento, por favor permtanos de 1 a 2 das hbiles para completar [address]  Los precios de los medicamentos varan con frecuencia dependiendo del Environmental consultant de dnde se surte la receta y alguna farmacias pueden ofrecer precios ms baratos.  El sitio web www.goodrx.com tiene cupones para medicamentos de Health and safety inspector. Los precios aqu no tienen en cuenta lo que podra costar con la ayuda del seguro (puede ser ms barato con su seguro), pero el sitio web puede darle el precio si no utiliz Tourist information centre manager.  - Puede imprimir el cupn correspondiente  y llevarlo con su receta a la farmacia.  - Tambin puede pasar por nuestra oficina durante el horario de atencin regular y Education officer, museum una tarjeta de cupones de GoodRx.  - Si necesita que su receta se enve electrnicamente a una farmacia diferente, informe a nuestra oficina a travs de MyChart de [place] o por telfono llamando al [phone] y presione la opcin 4.

## 2022-12-29 ENCOUNTER — Other Ambulatory Visit: Payer: Self-pay | Admitting: Urology

## 2022-12-29 DIAGNOSIS — E349 Endocrine disorder, unspecified: Secondary | ICD-10-CM

## 2023-01-11 NOTE — Progress Notes (Unsigned)
01/12/23 4:04 PM   Jacob Lightning Jr. 05-May-1950 [id number]  Referring provider:  Gracelyn Nurse, MD [address] [place],  [place] [address]  Chief Complaint  Patient presents with   testosterone deficiency   Urological history: 1. Testosterone deficiency  -testosterone pending -Hemoglobin/hematocrit (10/2022) 15.5/45.5 -AST/ALT (10/2022) 25/27 -managed with Clomid 50 mg daily   2. Erectile dysfunction  - risk factors for ED are age, BPH, testosterone deficiency, spinal stenosis, DM, HTN, HLD and anxiety - declined treatment   3. BPH with LUTS  -PSA (10/2022) 0.97 -s/p HoLEP on 05/25/2018  -prostate volume 60 cc on 2023 CT   4. High risk hematuria -former smoker -CTU (12/2021) - benign cysts -cysto (12/2021) - mild BPH regrowth   HPI: Jacob Perkins. is a 72 y.o.male who presents today for one year follow up.   Previous records reviewed.   He states he showers about 3 times a week and that is the only time he puts on the testosterone gel.  I PSS 5/1  He has no urinary complaints.  Patient denies any modifying or aggravating factors.  Patient denies any recent UTI's, gross hematuria, dysuria or suprapubic/flank pain.  Patient denies any fevers, chills, nausea or vomiting.     IPSS     Row Name 01/12/23 1534         International Prostate Symptom Score   How often have you had the sensation of not emptying your bladder? Not at All     How often have you had to urinate less than every two hours? Less than 1 in 5 times     How often have you found you stopped and started again several times when you urinated? Not at All     How often have you found it difficult to postpone urination? Less than half the time     How often have you had a weak urinary stream? Less than 1 in 5 times     How often have you had to strain to start urination? Not at All     How many times did you typically get up at night to urinate? 1 Time     Total IPSS Score 5        Quality of Life due to urinary symptoms   If you were to spend the rest of your life with your urinary condition just the way it is now how would you feel about that? Pleased                Score:  1-7 Mild 8-19 Moderate 20-35 Severe   PMH: Past Medical History:  Diagnosis Date   Adenomatous polyp    Allergy    Anxiety    Arthritis    BPH (benign prostatic hypertrophy)    Diabetes mellitus    Diverticulosis    GERD (gastroesophageal reflux disease)    History of kidney stones    Hyperlipidemia    Hypertension    Rosacea    Spinal stenosis    disc disease    Surgical History: Past Surgical History:  Procedure Laterality Date   ASD REPAIR     BACK SURGERY     COLONOSCOPY WITH PROPOFOL N/A 10/16/2018   Procedure: COLONOSCOPY WITH PROPOFOL;  Surgeon: Toledo, Boykin Nearing, MD;  Location: [facility] ENDOSCOPY;  Service: Gastroenterology;  Laterality: N/A;   HOLEP-LASER ENUCLEATION OF THE PROSTATE WITH MORCELLATION N/A 05/25/2018   Procedure: HOLEP-LASER ENUCLEATION OF THE PROSTATE WITH MORCELLATION;  Surgeon: Richardo Hanks,  Laurette Schimke, MD;  Location: [facility] ORS;  Service: Urology;  Laterality: N/A;   KNEE ARTHROSCOPY Right 04/15/2020   Procedure: ARTHROSCOPY KNEE;  Surgeon: Donato Heinz, MD;  Location: [facility] ORS;  Service: Orthopedics;  Laterality: Right;   L5-S1 Diskectomy  7/13   Dr Lovell Sheehan   LUMBAR FUSION     l4-l5   SPINAL CORD DECOMPRESSION      Home Medications:  Allergies as of 01/12/2023   No Known Allergies      Medication List        Accurate as of January 12, 2023  4:04 PM. If you have any questions, ask your nurse or doctor.          Adult One Daily Gummies Chew Chew 2 capsules by mouth at bedtime.   aspirin 81 MG tablet Take 81 mg by mouth at bedtime.   B-D ULTRAFINE III SHORT PEN 31G X 8 MM Misc Generic drug: Insulin Pen Needle USE AS DIRECTED   clindamycin 1 % external solution Commonly known as: CLEOCIN T Apply to affected areas scalp twice  daily as needed for bumps.   diazepam 10 MG tablet Commonly known as: Valium Take one hour prior to procedure.  Must have a driver.   esomeprazole 40 MG capsule Commonly known as: NEXIUM Take 40 mg by mouth every other day.   fish oil-omega-3 fatty acids 1000 MG capsule Take 1 g by mouth 2 (two) times daily.   fluocinonide 0.05 % external solution Commonly known as: LIDEX Apply to affected areas scalp once to twice daily as needed for itch. Avoid face.   fluticasone 50 MCG/ACT nasal spray Commonly known as: FLONASE USE 2 SPRAYS IN EACH NOSTRIL            EVERY DAY What changed:  when to take this reasons to take this   gabapentin 300 MG capsule Commonly known as: NEURONTIN Take 300 mg by mouth 2 (two) times daily.   GLUCOSAMINE-CHONDROITIN PO Take 1 tablet by mouth 2 (two) times daily.   hydrocortisone cream 1 % Apply 1 application topically daily as needed for itching.   ibuprofen 200 MG tablet Commonly known as: ADVIL Take 400-600 mg by mouth every 6 (six) hours as needed for headache or moderate pain.   ketoconazole 2 % cream Commonly known as: NIZORAL Apply to affected areas face once to twice daily as needed.   Melatonin 5 MG Caps Take 5-10 mg by mouth at bedtime as needed (sleep).   metFORMIN 1000 MG tablet Commonly known as: GLUCOPHAGE TAKE ONE TABLET TWICE A DAY What changed: when to take this   mirtazapine 15 MG tablet Commonly known as: REMERON Take 15 mg by mouth at bedtime.   Naproxen Sod-diphenhydrAMINE 220-25 MG Tabs Take 1 tablet by mouth at bedtime.   ONE TOUCH ULTRA TEST test strip Generic drug: glucose blood USE AS DIRECTED   Ozempic (2 MG/DOSE) 8 MG/3ML Sopn Generic drug: Semaglutide (2 MG/DOSE) Inject into the skin.   ramipril 10 MG capsule Commonly known as: ALTACE Take 10 mg by mouth daily.   simvastatin 40 MG tablet Commonly known as: ZOCOR TAKE 1 TABLET EVERY DAY What changed: when to take this   testosterone 50 MG/5GM  (1%) Gel Commonly known as: ANDROGEL Place ten grams onto the skin daily (2 packets) Strength: 50 mg/5gm What changed: See the new instructions. Changed by: Michiel Cowboy   vitamin C 1000 MG tablet Take 500 mg by mouth daily.  Allergies:  No Known Allergies  Family History: Family History  Problem Relation Age of Onset   Diabetes Father    Urolithiasis Father    Alcohol abuse Paternal Aunt    Kidney disease Cousin    Kidney cancer Neg Hx    Prostate cancer Neg Hx    Bladder Cancer Neg Hx     Social History:  reports that he quit smoking about 15 years ago. His smoking use included cigars. He has never used smokeless tobacco. He reports current alcohol use. He reports that he does not use drugs.   Physical Exam: BP (!) 186/78   Pulse (!) 49   Wt 163 lb (73.9 kg)   BMI 27.98 kg/m   Constitutional:  Well nourished. Alert and oriented, No acute distress. HEENT: [place] AT, moist mucus membranes.  Trachea midline Cardiovascular: No clubbing, cyanosis, or edema. Respiratory: Normal respiratory effort, no increased work of breathing. Neurologic: Grossly intact, no focal deficits, moving all 4 extremities. Psychiatric: Normal mood and affect.    Laboratory Data: Comprehensive Metabolic Panel (CMP) Order: [id number] Component Ref Range & Units 2 mo ago  Glucose 70 - 110 mg/dL 010 High   Sodium 932 - 145 mmol/L 143  Potassium 3.6 - 5.1 mmol/L 4.4  Chloride 97 - 109 mmol/L 106  Carbon Dioxide (CO2) 22.0 - 32.0 mmol/L 24.1  Urea Nitrogen (BUN) 7 - 25 mg/dL 11  Creatinine 0.7 - 1.3 mg/dL 0.8  Glomerular Filtration Rate (eGFR) >60 mL/min/1.73sq m 95  Comment: CKD-EPI (2021) does not include patient's race in the calculation of eGFR.  Monitoring changes of plasma creatinine and eGFR over time is useful for monitoring kidney function.  Interpretive Ranges for eGFR (CKD-EPI 2021):  eGFR:       >60 mL/min/1.73 sq. m - Normal eGFR:       30-59 mL/min/1.73 sq. m -  Moderately Decreased eGFR:       15-29 mL/min/1.73 sq. m  - Severely Decreased eGFR:       < 15 mL/min/1.73 sq. m  - Kidney Failure   Note: These eGFR calculations do not apply in acute situations when eGFR is changing rapidly or patients on dialysis.  Calcium 8.7 - 10.3 mg/dL 9.5  AST 8 - 39 U/L 25  ALT 6 - 57 U/L 27  Alk Phos (alkaline Phosphatase) 34 - 104 U/L 36  Albumin 3.5 - 4.8 g/dL 4.4  Bilirubin, Total 0.3 - 1.2 mg/dL 0.9  Protein, Total 6.1 - 7.9 g/dL 6.2  A/G Ratio 1.0 - 5.0 gm/dL 2.4  Resulting Agency [facility]   Specimen Collected: 10/31/22 10:32   Performed by: [facility] Last Resulted: 10/31/22 15:48  Received From: [facility]  Result Received: 12/29/22 10:20    CBC w/auto Differential (5 Part) Order: [id number] Component Ref Range & Units 2 mo ago  WBC (White Blood Cell Count) 4.1 - 10.2 10^3/uL 7.9  RBC (Red Blood Cell Count) 4.69 - 6.13 10^6/uL 5.03  Hemoglobin 14.1 - 18.1 gm/dL 54.2  Hematocrit 70.6 - 52.0 % 45.5  MCV (Mean Corpuscular Volume) 80.0 - 100.0 fl 90.5  MCH (Mean Corpuscular Hemoglobin) 27.0 - 31.2 pg 30.8  MCHC (Mean Corpuscular Hemoglobin Concentration) 32.0 - 36.0 gm/dL 23.7  Platelet Count 628 - 450 10^3/uL 196  RDW-CV (Red Cell Distribution Width) 11.6 - 14.8 % 14.2  MPV (Mean Platelet Volume) 9.4 - 12.4 fl 10.7  Neutrophils 1.50 - 7.80 10^3/uL 4.82  Lymphocytes 1.00 -  3.60 10^3/uL 1.91  Monocytes 0.00 - 1.50 10^3/uL 0.92  Eosinophils 0.00 - 0.55 10^3/uL 0.20  Basophils 0.00 - 0.09 10^3/uL 0.05  Neutrophil % 32.0 - 70.0 % 60.9  Lymphocyte % 10.0 - 50.0 % 24.1  Monocyte % 4.0 - 13.0 % 11.6  Eosinophil % 1.0 - 5.0 % 2.5  Basophil% 0.0 - 2.0 % 0.6  Immature Granulocyte % <=0.7 % 0.3  Immature Granulocyte Count <=0.06 10^3/L 0.02  Resulting Agency [facility]   Specimen Collected: 10/31/22 10:32   Performed by: [facility]  Last Resulted: 10/31/22 11:41  Received From: [facility]  Result Received: 12/29/22 10:20   Lipid Panel w/calc LDL Order: [id number] Component Ref Range & Units 2 mo ago  Cholesterol, Total 100 - 200 mg/dL 409  Triglyceride 35 - 199 mg/dL 811  HDL (High Density Lipoprotein) Cholesterol 29.0 - 71.0 mg/dL 91.4  LDL Calculated 0 - 130 mg/dL 71  VLDL Cholesterol mg/dL 21  Cholesterol/HDL Ratio 2.9  Resulting Agency [facility]   Specimen Collected: 10/31/22 10:32   Performed by: [facility] Last Resulted: 10/31/22 15:49  Received From: [facility]  Result Received: 12/29/22 10:20    Hemoglobin A1C Order: [id number] Component Ref Range & Units 4 mo ago  Hemoglobin A1C 4.2 - 5.6 % 6.3 High   Average Blood Glucose (Calc) mg/dL 086  Resulting Agency [facility]  Narrative Performed by [facility] Normal Range:    4.2 - 5.6% Increased Risk:  5.7 - 6.4% Diabetes:        >= 6.5% Glycemic Control for adults with diabetes:  <7%    Specimen Collected: 08/18/22 09:12   Performed by: [facility] Last Resulted: 08/18/22 10:23  Received From: [facility]  Result Received: 08/23/22 14:19   Urinalysis Urinalysis w/Microscopic Order: [id number] Component Ref Range & Units 2 mo ago  Color Colorless, Straw, Light Yellow, Yellow, Dark Yellow Yellow  Clarity Clear Cloudy Abnormal   Specific Gravity 1.005 - 1.030 1.017  pH, Urine 5.0 - 8.0 6.0  Protein, Urinalysis Negative mg/dL 1+ Abnormal   Glucose, Urinalysis Negative mg/dL Negative  Ketones, Urinalysis Negative mg/dL Negative  Blood, Urinalysis Negative Negative  Nitrite, Urinalysis Negative Negative  Leukocyte Esterase, Urinalysis Negative Negative  Bilirubin, Urinalysis Negative Negative  Urobilinogen, Urinalysis 0.2 - 1.0 mg/dL 0.2  Mucous, Urine None Seen PRESENT Abnormal   Sperm,  Urine Microscopic None Seen PRESENT Abnormal   WBC, UA <=5 /hpf 3  Red Blood Cells, Urinalysis <=3 /hpf 1  Bacteria, Urinalysis 0 - 5 /hpf 0-5  Squamous Epithelial Cells, Urinalysis /hpf 2  Resulting Agency [facility]   Specimen Collected: 10/31/22 10:32   Performed by: [facility] Last Resulted: 10/31/22 13:09  Received From: [facility]  Result Received: 12/29/22 10:20  I have reviewed the labs.   Pertinent Imaging: No results found for any visits on 01/12/23.   Assessment & Plan:    Testosterone deficiency  - Testosterone pending  - continue Androgel, but increase it to 2 packets three times weekly  2. BPH with LUTS - s/p HoLEP 04/2018  -PSA stable --continue conservative management, avoiding bladder irritants and timed voiding's   3. ED - Discussed prescribing PDE5i but he declined   4. High risk hematuria -former smoker -UA w/ PCP negative for micro heme --  no reports of gross heme -work up in 2023 - no worrisome findings   Return in about 6 months (around 07/13/2023) for testosterone, [facility]   [facility] [address] [place], [place] [address] [phone]

## 2023-01-12 ENCOUNTER — Encounter: Payer: Self-pay | Admitting: Urology

## 2023-01-12 ENCOUNTER — Other Ambulatory Visit: Payer: Self-pay | Admitting: *Deleted

## 2023-01-12 ENCOUNTER — Ambulatory Visit: Payer: Medicare HMO | Admitting: Urology

## 2023-01-12 VITALS — BP 186/78 | HR 49 | Wt 163.0 lb

## 2023-01-12 DIAGNOSIS — N401 Enlarged prostate with lower urinary tract symptoms: Secondary | ICD-10-CM

## 2023-01-12 DIAGNOSIS — E349 Endocrine disorder, unspecified: Secondary | ICD-10-CM

## 2023-01-12 DIAGNOSIS — N138 Other obstructive and reflux uropathy: Secondary | ICD-10-CM

## 2023-01-12 DIAGNOSIS — E291 Testicular hypofunction: Secondary | ICD-10-CM | POA: Diagnosis not present

## 2023-01-12 DIAGNOSIS — N529 Male erectile dysfunction, unspecified: Secondary | ICD-10-CM

## 2023-01-12 DIAGNOSIS — R319 Hematuria, unspecified: Secondary | ICD-10-CM

## 2023-01-12 MED ORDER — TESTOSTERONE 50 MG/5GM (1%) TD GEL: TRANSDERMAL | 3 refills | Status: DC

## 2023-01-12 NOTE — Addendum Note (Signed)
Addended by: Winn Jock on: 01/12/2023 04:23 PM   Modules accepted: Orders

## 2023-01-13 LAB — TESTOSTERONE: Testosterone: 428 ng/dL (ref 264–916)

## 2023-07-10 ENCOUNTER — Other Ambulatory Visit: Payer: Self-pay | Admitting: Urology

## 2023-07-10 DIAGNOSIS — E349 Endocrine disorder, unspecified: Secondary | ICD-10-CM

## 2023-07-17 NOTE — Progress Notes (Unsigned)
 07/18/23 2:54 PM   Jacob Sear Jr. 08/25/1950 [id number]  Referring provider:  Little Riff, MD [address] [place],  [place] [address]  Urological history: 1. Hypogonadism  -testosterone  level pending -hemoglobin/hematocrit pending  -testosterone  gel 50mg /5gm (1%) 2 packets three days weekly   2. Erectile dysfunction  - declined treatment   3. BPH with LUTS  -PSA pending  -s/p HoLEP on 05/25/2018  -prostate volume 60 cc on 2023 CT   4. High risk hematuria -former smoker -CTU (12/2021) - benign cysts -cysto (12/2021) - mild BPH regrowth   HPI: Jacob Perkins. is a 73 y.o. man who presents today for 6 month follow up for recheck of TRT.   Previous records reviewed.   He states he showers about 3 times a week and that is the only time he puts on the testosterone  gel.  He did get a prescription for sildenafil 50 mg from his primary care physician, but he has not had a chance to take the medication  I PSS 4/2  He is having some weakening of the urinary stream, but it is not bothersome.  Patient denies any modifying or aggravating factors.  Patient denies any recent UTI's, gross hematuria, dysuria or suprapubic/flank pain.  Patient denies any fevers, chills, nausea or vomiting.    He mentioned he had a onsite nurse visit from his insurance company and they performed a urinalysis on him and told him he had some abnormal findings which should be followed up with his provider.  He states mention something about " creatinine."  He did not have the letter with him today, but he will send me an image of it via MyChart when he gets home.   IPSS     Row Name 07/18/23 1400         International Prostate Symptom Score   How often have you had the sensation of not emptying your bladder? Less than 1 in 5     How often have you had to urinate less than every two hours? Less than 1 in 5 times     How often have you found you stopped and  started again several times when you urinated? Not at All     How often have you found it difficult to postpone urination? Less than 1 in 5 times     How often have you had a weak urinary stream? Less than 1 in 5 times     How often have you had to strain to start urination? Not at All     How many times did you typically get up at night to urinate? None     Total IPSS Score 4       Quality of Life due to urinary symptoms   If you were to spend the rest of your life with your urinary condition just the way it is now how would you feel about that? Mostly Satisfied              Score:  1-7 Mild 8-19 Moderate 20-35 Severe   PMH: Past Medical History:  Diagnosis Date   Adenomatous polyp    Allergy    Anxiety    Arthritis    BPH (benign prostatic hypertrophy)    Diabetes mellitus    Diverticulosis    GERD (gastroesophageal reflux disease)    History of kidney stones    Hyperlipidemia    Hypertension    Rosacea  Spinal stenosis    disc disease    Surgical History: Past Surgical History:  Procedure Laterality Date   ASD REPAIR     BACK SURGERY     COLONOSCOPY WITH PROPOFOL  N/A 10/16/2018   Procedure: COLONOSCOPY WITH PROPOFOL ;  Surgeon: Toledo, Alphonsus Jeans, MD;  Location: [facility];  Service: Gastroenterology;  Laterality: N/A;   HOLEP-LASER ENUCLEATION OF THE PROSTATE WITH MORCELLATION N/A 05/25/2018   Procedure: HOLEP-LASER ENUCLEATION OF THE PROSTATE WITH MORCELLATION;  Surgeon: Lawerence Pressman, MD;  Location: [facility] ORS;  Service: Urology;  Laterality: N/A;   KNEE ARTHROSCOPY Right 04/15/2020   Procedure: ARTHROSCOPY KNEE;  Surgeon: Arlyne Lame, MD;  Location: [facility] ORS;  Service: Orthopedics;  Laterality: Right;   L5-S1 Diskectomy  7/13   Dr Larrie Po   LUMBAR FUSION     l4-l5   SPINAL CORD DECOMPRESSION      Home Medications:  Allergies as of 07/18/2023   No Known Allergies      Medication List        Accurate as of July 18, 2023  2:54 PM. If you  have any questions, ask your nurse or doctor.          Adult One Daily Gummies Chew Chew 2 capsules by mouth at bedtime.   aspirin 81 MG tablet Take 81 mg by mouth at bedtime.   B-D ULTRAFINE III SHORT PEN 31G X 8 MM Misc Generic drug: Insulin  Pen Needle USE AS DIRECTED   clindamycin  1 % external solution Commonly known as: CLEOCIN  T Apply to affected areas scalp twice daily as needed for bumps.   diazepam  10 MG tablet Commonly known as: Valium  Take one hour prior to procedure.  Must have a driver.   esomeprazole 40 MG capsule Commonly known as: NEXIUM Take 40 mg by mouth every other day.   fish oil-omega-3 fatty acids 1000 MG capsule Take 1 g by mouth 2 (two) times daily.   fluocinonide  0.05 % external solution Commonly known as: LIDEX  Apply to affected areas scalp once to twice daily as needed for itch. Avoid face.   fluticasone 50 MCG/ACT nasal spray Commonly known as: FLONASE USE 2 SPRAYS IN EACH NOSTRIL            EVERY DAY What changed:  when to take this reasons to take this   gabapentin 300 MG capsule Commonly known as: NEURONTIN Take 300 mg by mouth 2 (two) times daily.   GLUCOSAMINE-CHONDROITIN PO Take 1 tablet by mouth 2 (two) times daily.   hydrocortisone cream 1 % Apply 1 application topically daily as needed for itching.   ibuprofen 200 MG tablet Commonly known as: ADVIL Take 400-600 mg by mouth every 6 (six) hours as needed for headache or moderate pain.   ketoconazole  2 % cream Commonly known as: NIZORAL  Apply to affected areas face once to twice daily as needed.   Melatonin 5 MG Caps Take 5-10 mg by mouth at bedtime as needed (sleep).   metFORMIN 1000 MG tablet Commonly known as: GLUCOPHAGE TAKE ONE TABLET TWICE A DAY What changed: when to take this   mirtazapine 15 MG tablet Commonly known as: REMERON Take 15 mg by mouth at bedtime.   Naproxen Sod-diphenhydrAMINE 220-25 MG Tabs Take 1 tablet by mouth at bedtime.   ONE TOUCH  ULTRA TEST test strip Generic drug: glucose blood USE AS DIRECTED   Ozempic (2 MG/DOSE) 8 MG/3ML Sopn Generic drug: Semaglutide (2 MG/DOSE) Inject into the skin.   ramipril 10  MG capsule Commonly known as: ALTACE Take 10 mg by mouth daily.   sildenafil 100 MG tablet Commonly known as: VIAGRA Take 100 mg by mouth.   simvastatin  40 MG tablet Commonly known as: ZOCOR  TAKE 1 TABLET EVERY DAY What changed: when to take this   testosterone  50 MG/5GM (1%) Gel Commonly known as: ANDROGEL  PLACE TEN GRAMS ONTO THE SKIN DAILY (2 PACKETS) STRENGTH: 50 MG/5GM   vitamin C 1000 MG tablet Take 500 mg by mouth daily.        Allergies:  No Known Allergies  Family History: Family History  Problem Relation Age of Onset   Diabetes Father    Urolithiasis Father    Alcohol abuse Paternal Aunt    Kidney disease Cousin    Kidney cancer Neg Hx    Prostate cancer Neg Hx    Bladder Cancer Neg Hx     Social History:  reports that he quit smoking about 16 years ago. His smoking use included cigars. He has never used smokeless tobacco. He reports current alcohol use. He reports that he does not use drugs.   Physical Exam: BP 136/74   Pulse (!) 48   Ht 5\' 4"  (1.626 m)   Wt 175 lb (79.4 kg)   BMI 30.04 kg/m   Constitutional:  Well nourished. Alert and oriented, No acute distress. HEENT: [place] AT, moist mucus membranes.  Trachea midline, no masses. Cardiovascular: No clubbing, cyanosis, or edema. Respiratory: Normal respiratory effort, no increased work of breathing. GU: patient opted out of exam  Rectal: patient opted out of exam  Neurologic: Grossly intact, no focal deficits, moving all 4 extremities. Psychiatric: Normal mood and affect.    Laboratory Data: Basic Metabolic Panel (BMP) Order: [id number] Component Ref Range & Units 2 mo ago  Glucose 70 - 110 mg/dL 84  Sodium 409 - 811 mmol/L 144  Potassium 3.6 - 5.1 mmol/L 4.7  Chloride 97 - 109 mmol/L 106  Carbon Dioxide  (CO2) 22.0 - 32.0 mmol/L 30.7  Calcium 8.7 - 10.3 mg/dL 9.8  Urea Nitrogen (BUN) 7 - 25 mg/dL 10  Creatinine 0.7 - 1.3 mg/dL 0.7  Glomerular Filtration Rate (eGFR) >60 mL/min/1.73sq m 99  Comment: CKD-EPI (2021) does not include patient's race in the calculation of eGFR.  Monitoring changes of plasma creatinine and eGFR over time is useful for monitoring kidney function.  Interpretive Ranges for eGFR (CKD-EPI 2021):  eGFR:       >60 mL/min/1.73 sq. m - Normal eGFR:       30-59 mL/min/1.73 sq. m - Moderately Decreased eGFR:       15-29 mL/min/1.73 sq. m  - Severely Decreased eGFR:       < 15 mL/min/1.73 sq. m  - Kidney Failure   Note: These eGFR calculations do not apply in acute situations when eGFR is changing rapidly or patients on dialysis.  BUN/Crea Ratio 6.0 - 20.0 14.3  Anion Gap w/K 6.0 - 16.0 12  Resulting Agency [facility] - LAB   Specimen Collected: 05/03/23 14:20   Performed by: [facility] Last Resulted: 05/03/23 17:10  Received From: [facility]  Result Received: 07/12/23 14:29   Hemoglobin A1C Order: [id number] Component Ref Range & Units 4 mo ago  Hemoglobin A1C 4.2 - 5.6 % 6.4 High   Average Blood Glucose (Calc) mg/dL 213  Resulting Agency [facility] - LAB  Narrative Performed by [employer] [facility] - LAB Normal Range:  4.2 - 5.6% Increased Risk:  5.7 - 6.4% Diabetes:        >= 6.5% Glycemic Control for adults with diabetes:  <7%    Specimen Collected: 02/27/23 14:44   Performed by: [facility] Last Resulted: 02/27/23 14:53  Received From: [facility]  Result Received: 07/12/23 14:29  I have reviewed the labs.   Pertinent Imaging: N/A   Assessment & Plan:    1. Hypogonadism - Testosterone  level pending  - hemoglobin/hematocrit pending  - continue testosterone  gel 50 mg/ 5 gm (1%) 3 days weekly  2. BPH with LU TS - PSA pending  - s/p HoLEP 04/2018  -  continue conservative management, avoiding bladder irritants and timed voiding's   3. ED - Was prescribed sildenafil 50 mg on demand dosing by PCP  4. High risk hematuria -former smoker -no reports of gross heme -work up in 2023 - no worrisome findings   5. Abnormal UA - Asked that the patient send me an image of the findings so we can make sure he gets the appropriate follow-up  Return in about 6 months (around 01/17/2024) for PSA, testosterone , H & H, I PSS, SHIM  .  Briant Camper   [facility] [address], [address] [place], [place] [address] [phone]

## 2023-07-18 ENCOUNTER — Ambulatory Visit: Payer: Medicare HMO | Admitting: Urology

## 2023-07-18 ENCOUNTER — Encounter: Payer: Self-pay | Admitting: Urology

## 2023-07-18 VITALS — BP 136/74 | HR 48 | Ht 64.0 in | Wt 175.0 lb

## 2023-07-18 DIAGNOSIS — R319 Hematuria, unspecified: Secondary | ICD-10-CM

## 2023-07-18 DIAGNOSIS — N529 Male erectile dysfunction, unspecified: Secondary | ICD-10-CM

## 2023-07-18 DIAGNOSIS — E291 Testicular hypofunction: Secondary | ICD-10-CM

## 2023-07-18 DIAGNOSIS — N138 Other obstructive and reflux uropathy: Secondary | ICD-10-CM

## 2023-07-18 DIAGNOSIS — N401 Enlarged prostate with lower urinary tract symptoms: Secondary | ICD-10-CM | POA: Diagnosis not present

## 2023-07-19 LAB — PSA: Prostate Specific Ag, Serum: 0.9 ng/mL (ref 0.0–4.0)

## 2023-07-19 LAB — TESTOSTERONE: Testosterone: 607 ng/dL (ref 264–916)

## 2023-07-19 LAB — HEMOGLOBIN AND HEMATOCRIT, BLOOD
Hematocrit: 44.9 % (ref 37.5–51.0)
Hemoglobin: 14.8 g/dL (ref 13.0–17.7)

## 2023-08-29 ENCOUNTER — Ambulatory Visit: Payer: Medicare HMO | Admitting: Dermatology

## 2023-08-29 DIAGNOSIS — I781 Nevus, non-neoplastic: Secondary | ICD-10-CM

## 2023-08-29 DIAGNOSIS — L814 Other melanin hyperpigmentation: Secondary | ICD-10-CM

## 2023-08-29 DIAGNOSIS — Z1283 Encounter for screening for malignant neoplasm of skin: Secondary | ICD-10-CM

## 2023-08-29 DIAGNOSIS — L729 Follicular cyst of the skin and subcutaneous tissue, unspecified: Secondary | ICD-10-CM

## 2023-08-29 DIAGNOSIS — W908XXA Exposure to other nonionizing radiation, initial encounter: Secondary | ICD-10-CM | POA: Diagnosis not present

## 2023-08-29 DIAGNOSIS — D225 Melanocytic nevi of trunk: Secondary | ICD-10-CM

## 2023-08-29 DIAGNOSIS — L820 Inflamed seborrheic keratosis: Secondary | ICD-10-CM | POA: Diagnosis not present

## 2023-08-29 DIAGNOSIS — L720 Epidermal cyst: Secondary | ICD-10-CM

## 2023-08-29 DIAGNOSIS — L578 Other skin changes due to chronic exposure to nonionizing radiation: Secondary | ICD-10-CM | POA: Diagnosis not present

## 2023-08-29 DIAGNOSIS — D1801 Hemangioma of skin and subcutaneous tissue: Secondary | ICD-10-CM

## 2023-08-29 DIAGNOSIS — L219 Seborrheic dermatitis, unspecified: Secondary | ICD-10-CM

## 2023-08-29 DIAGNOSIS — D229 Melanocytic nevi, unspecified: Secondary | ICD-10-CM

## 2023-08-29 DIAGNOSIS — L821 Other seborrheic keratosis: Secondary | ICD-10-CM

## 2023-08-29 MED ORDER — HYDROCORTISONE 2.5 % EX CREA: TOPICAL_CREAM | CUTANEOUS | 2 refills | Status: AC

## 2023-08-29 NOTE — Patient Instructions (Addendum)
 For Seborrheic Derm HC 2.5% cream, apply to affected areas 1-2 times daily, forehead, ears as needed for itchy rash, then discontinue. Do not use daily.   Topical steroids (such as triamcinolone , fluocinolone, fluocinonide , mometasone, clobetasol, halobetasol, betamethasone , hydrocortisone) can cause thinning and lightening of the skin if they are used for too long in the same area. Your physician has selected the right strength medicine for your problem and area affected on the body. Please use your medication only as directed by your physician to prevent side effects.    Cryotherapy Aftercare  Wash gently with soap and water everyday.   Apply Vaseline and Band-Aid daily until healed.     Recommend daily broad spectrum sunscreen SPF 30+ to sun-exposed areas, reapply every 2 hours as needed. Call for new or changing lesions.  Staying in the shade or wearing long sleeves, sun glasses (UVA+UVB protection) and wide brim hats (4-inch brim around the entire circumference of the hat) are also recommended for sun protection.     Melanoma ABCDEs  Melanoma is the most dangerous type of skin cancer, and is the leading cause of death from skin disease.  You are more likely to develop melanoma if you: Have light-colored skin, light-colored eyes, or red or blond hair Spend a lot of time in the sun Tan regularly, either outdoors or in a tanning bed Have had blistering sunburns, especially during childhood Have a close family member who has had a melanoma Have atypical moles or large birthmarks  Early detection of melanoma is key since treatment is typically straightforward and cure rates are extremely high if we catch it early.   The first sign of melanoma is often a change in a mole or a new dark spot.  The ABCDE system is a way of remembering the signs of melanoma.  A for asymmetry:  The two halves do not match. B for border:  The edges of the growth are irregular. C for color:  A mixture of  colors are present instead of an even brown color. D for diameter:  Melanomas are usually (but not always) greater than 6mm - the size of a pencil eraser. E for evolution:  The spot keeps changing in size, shape, and color.  Please check your skin once per month between visits. You can use a small mirror in front and a large mirror behind you to keep an eye on the back side or your body.   If you see any new or changing lesions before your next follow-up, please call to schedule a visit.  Please continue daily skin protection including broad spectrum sunscreen SPF 30+ to sun-exposed areas, reapplying every 2 hours as needed when you're outdoors.   Staying in the shade or wearing long sleeves, sun glasses (UVA+UVB protection) and wide brim hats (4-inch brim around the entire circumference of the hat) are also recommended for sun protection.     Due to recent changes in healthcare laws, you may see results of your pathology and/or laboratory studies on MyChart before the doctors have had a chance to review them. We understand that in some cases there may be results that are confusing or concerning to you. Please understand that not all results are received at the same time and often the doctors may need to interpret multiple results in order to provide you with the best plan of care or course of treatment. Therefore, we ask that you please give us  2 business days to thoroughly review all your results before  contacting the office for clarification. Should we see a critical lab result, you will be contacted sooner.   If You Need Anything After Your Visit  If you have any questions or concerns for your doctor, please call our main line at [phone] and press option 4 to reach your doctor's medical assistant. If no one answers, please leave a voicemail as directed and we will return your call as soon as possible. Messages left after 4 pm will be answered the following business day.   You may also  send us  a message via MyChart. We typically respond to MyChart messages within 1-2 business days.  For prescription refills, please ask your pharmacy to contact our office. Our fax number is [phone].  If you have an urgent issue when the clinic is closed that cannot wait until the next business day, you can page your doctor at the number below.    Please note that while we do our best to be available for urgent issues outside of office hours, we are not available 24/7.   If you have an urgent issue and are unable to reach us , you may choose to seek medical care at your doctor's office, retail clinic, urgent care center, or emergency room.  If you have a medical emergency, please immediately call 911 or go to the emergency department.  Pager Numbers  - Dr. Bary Likes: [phone]  - Dr. Annette Barters: [phone]  - Dr. Felipe Horton: [phone]   In the event of inclement weather, please call our main line at [phone] for an update on the status of any delays or closures.  Dermatology Medication Tips: Please keep the boxes that topical medications come in in order to help keep track of the instructions about where and how to use these. Pharmacies typically print the medication instructions only on the boxes and not directly on the medication tubes.   If your medication is too expensive, please contact our office at [phone] option 4 or send us  a message through MyChart.   We are unable to tell what your co-pay for medications will be in advance as this is different depending on your insurance coverage. However, we may be able to find a substitute medication at lower cost or fill out paperwork to get insurance to cover a needed medication.   If a prior authorization is required to get your medication covered by your insurance company, please allow us  1-2 business days to complete this process.  Drug prices often vary depending on where the prescription is filled and some pharmacies may  offer cheaper prices.  The website www.goodrx.com contains coupons for medications through different pharmacies. The prices here do not account for what the cost may be with help from insurance (it may be cheaper with your insurance), but the website can give you the price if you did not use any insurance.  - You can print the associated coupon and take it with your prescription to the pharmacy.  - You may also stop by our office during regular business hours and pick up a GoodRx coupon card.  - If you need your prescription sent electronically to a different pharmacy, notify our office through [facility] or by phone at [phone] option 4.     Si Usted Necesita Algo Despus de Su Visita  Tambin puede enviarnos un mensaje a travs de Clinical cytogeneticist. Por lo general respondemos a los mensajes de MyChart en el transcurso de 1 a 2 das hbiles.  Para renovar recetas, por favor pida  a su farmacia que se ponga en contacto con nuestra oficina. Franz Jacks de fax es [place] [phone].  Si tiene un asunto urgente cuando la clnica est cerrada y que no puede esperar hasta el siguiente da hbil, puede llamar/localizar a su doctor(a) al nmero que aparece a continuacin.   Por favor, tenga en cuenta que aunque hacemos todo lo posible para estar disponibles para asuntos urgentes fuera del horario de [place], no estamos disponibles las 24 horas del da, los 7 [address]  [address] de la [place].   Si tiene un problema urgente y no puede comunicarse con nosotros, puede optar por buscar atencin mdica  en el consultorio de su doctor(a), en una clnica privada, en un centro de atencin urgente o en una sala de emergencias.  Si tiene Engineer, drilling, por favor llame inmediatamente al 911 o vaya a la sala de emergencias.  Nmeros de bper  - Dr. Bary Likes: [phone]  - Dra. Annette Barters: [phone]  - Dr. Felipe Horton: [phone]   En caso de inclemencias del tiempo, por favor llame a Lajuan Pila principal al  [phone] para una actualizacin sobre el [place] de cualquier retraso o cierre.  Consejos para la medicacin en dermatologa: Por favor, guarde las cajas en las que vienen los medicamentos de uso tpico para ayudarle a seguir las instrucciones sobre dnde y cmo usarlos. Las farmacias generalmente imprimen las instrucciones del medicamento slo en las cajas y no directamente en los tubos del [place].   Si su medicamento es muy caro, por favor, pngase en contacto con Bettyjane Brunet llamando al [phone] y presione la opcin 4 o envenos un mensaje a travs de Clinical cytogeneticist.   No podemos decirle cul ser su copago por los medicamentos por adelantado ya que esto es diferente dependiendo de la cobertura de su seguro. Sin embargo, es posible que podamos encontrar un medicamento sustituto a Audiological scientist un formulario para que el seguro cubra el medicamento que se considera necesario.   Si se requiere una autorizacin previa para que su compaa de seguros [place] su medicamento, por favor permtanos de 1 a 2 das hbiles para completar este proceso.  Los precios de los medicamentos varan con frecuencia dependiendo del Environmental consultant de dnde se surte la receta y alguna farmacias pueden ofrecer precios ms baratos.  El sitio web www.goodrx.com tiene cupones para medicamentos de Health and safety inspector. Los precios aqu no tienen en cuenta lo que podra costar con la ayuda del seguro (puede ser ms barato con su seguro), pero el sitio web puede darle el precio si no utiliz Tourist information centre manager.  - Puede imprimir el cupn correspondiente y llevarlo con su receta a la farmacia.  - Tambin puede pasar por nuestra oficina durante el horario de atencin regular y Education officer, museum una tarjeta de cupones de GoodRx.  - Si necesita que su receta se enve electrnicamente a una farmacia diferente, informe a nuestra oficina a travs de MyChart de [place] o por telfono llamando al [phone] y presione la opcin 4.

## 2023-08-29 NOTE — Progress Notes (Signed)
 Follow-Up Visit   Subjective  Jacob Perkins. is a 73 y.o. male who presents for the following: Skin Cancer Screening and Upper Body Skin Exam. Place at back, not itchy or sore but has changed in color- darker and irregular and wife is concerned. Patient uses ketoconazole  2% cream prn for seb derm to brows. Doesn't clear up.  The patient presents for Upper Body Skin Exam (UBSE) for skin cancer screening and mole check. The patient has spots, moles and lesions to be evaluated, some may be new or changing and the patient may have concern these could be cancer.   The following portions of the chart were reviewed this encounter and updated as appropriate: medications, allergies, medical history  Review of Systems:  No other skin or systemic complaints except as noted in HPI or Assessment and Plan.  Objective  Well appearing patient in no apparent distress; mood and affect are within normal limits.  All skin waist up examined. Relevant physical exam findings are noted in the Assessment and Plan.  Left mid back x1 Stuck on waxy brown plaque with erythema  Assessment & Plan   INFLAMED SEBORRHEIC KERATOSIS Left mid back x1 Symptomatic, irritating, patient would like treated.  Reassured benign age-related growth. Patient advised if not resolved return sooner, may need a second treatment due to size.  Destruction of lesion - Left mid back x1  Destruction method: cryotherapy   Informed consent: discussed and consent obtained   Lesion destroyed using liquid nitrogen: Yes   Region frozen until ice ball extended beyond lesion: Yes   Outcome: patient tolerated procedure well with no complications   Post-procedure details: wound care instructions given   Additional details:  Prior to procedure, discussed risks of blister formation, small wound, skin dyspigmentation, or rare scar following cryotherapy. Recommend Vaseline ointment to treated areas while healing.   Skin cancer screening  performed today.  Actinic Damage - Chronic condition, secondary to cumulative UV/sun exposure - diffuse scaly erythematous macules with underlying dyspigmentation - Recommend daily broad spectrum sunscreen SPF 30+ to sun-exposed areas, reapply every 2 hours as needed.  - Staying in the shade or wearing long sleeves, sun glasses (UVA+UVB protection) and wide brim hats (4-inch brim around the entire circumference of the hat) are also recommended for sun protection.  - Call for new or changing lesions.  Lentigines, Seborrheic Keratoses, Hemangiomas - Benign normal skin lesions - Benign-appearing - Call for any changes  Melanocytic Nevi - Tan-brown and/or pink-flesh-colored symmetric macules and papules - 8 x 4 mm medium light brown speckled macule at right lower back- stable.  - Benign appearing on exam today - Observation - Call clinic for new or changing moles - Recommend daily use of broad spectrum spf 30+ sunscreen to sun-exposed areas.   Telangiectasia.  - Right lower chest. 7mm blanching pink macule, stable. Also face, blanching pink/red macules- same.  - Dilated blood vessel - Benign appearing on exam - Call for changes  SEBORRHEIC DERMATITIS Exam: Pink scaly patch with greasy scale at glabella/eyebrows, forehead, L ear canal.   Chronic and persistent condition with duration or expected duration over one year. Condition is bothersome/symptomatic for patient. Currently flared.   Seborrheic Dermatitis is a chronic persistent rash characterized by pinkness and scaling most commonly of the mid face but also can occur on the scalp (dandruff), ears; mid chest, mid back and groin.  It tends to be exacerbated by stress and cooler weather.  People who have neurologic disease may experience  new onset or exacerbation of existing seborrheic dermatitis.  The condition is not curable but treatable and can be controlled.  Treatment Plan: Continue ketoconazole  2% cream apply to AA face  qd/bid. May use daily Start HC 2.5% cream, apply 1-2 times daily prn itchy rash to aa, forehead, ears, then discontinue. Do not use daily once rash/itching cleared.   Topical steroids (such as triamcinolone , fluocinolone, fluocinonide , mometasone, clobetasol, halobetasol, betamethasone , hydrocortisone) can cause thinning and lightening of the skin if they are used for too long in the same area. Your physician has selected the right strength medicine for your problem and area affected on the body. Please use your medication only as directed by your physician to prevent side effects.    EPIDERMAL INCLUSION CYST Exam: Firm white papule at left medial cheek   Benign-appearing. Exam most consistent with an epidermal inclusion cyst. Discussed that a cyst is a benign growth that can grow over time and sometimes get irritated or inflamed. Recommend observation if it is not bothersome. Discussed option of surgical excision to remove it if it is growing, symptomatic, or other changes noted. Please call for new or changing lesions so they can be evaluated.    Return in 1 year (on 08/28/2024) for UBSE, w/ Dr. Annette Barters.  I, Jacquelynn V. Grier Leber, CMA, am acting as scribe for Artemio Larry, MD .   Documentation: I have reviewed the above documentation for accuracy and completeness, and I agree with the above.  Artemio Larry, MD

## 2024-07-17 ENCOUNTER — Ambulatory Visit: Admitting: Urology

## 2024-09-10 ENCOUNTER — Ambulatory Visit: Admitting: Dermatology
